# Patient Record
Sex: Female | Born: 1955 | Race: Black or African American | Hispanic: No | Marital: Married | Smoking: Former smoker
Health system: Southern US, Community
[De-identification: ages and names within clinical notes are randomized; demographics above are authoritative.]

## PROBLEM LIST (undated history)

## (undated) DIAGNOSIS — M1990 Unspecified osteoarthritis, unspecified site: Secondary | ICD-10-CM

## (undated) DIAGNOSIS — M47819 Spondylosis without myelopathy or radiculopathy, site unspecified: Secondary | ICD-10-CM

## (undated) DIAGNOSIS — M19011 Primary osteoarthritis, right shoulder: Secondary | ICD-10-CM

## (undated) DIAGNOSIS — K5900 Constipation, unspecified: Secondary | ICD-10-CM

## (undated) DIAGNOSIS — E785 Hyperlipidemia, unspecified: Secondary | ICD-10-CM

## (undated) DIAGNOSIS — H9319 Tinnitus, unspecified ear: Secondary | ICD-10-CM

## (undated) DIAGNOSIS — E559 Vitamin D deficiency, unspecified: Secondary | ICD-10-CM

## (undated) DIAGNOSIS — H269 Unspecified cataract: Secondary | ICD-10-CM

## (undated) DIAGNOSIS — L853 Xerosis cutis: Secondary | ICD-10-CM

## (undated) DIAGNOSIS — M2550 Pain in unspecified joint: Secondary | ICD-10-CM

## (undated) DIAGNOSIS — F32A Depression, unspecified: Secondary | ICD-10-CM

## (undated) DIAGNOSIS — D259 Leiomyoma of uterus, unspecified: Secondary | ICD-10-CM

## (undated) DIAGNOSIS — J309 Allergic rhinitis, unspecified: Secondary | ICD-10-CM

## (undated) DIAGNOSIS — T7840XA Allergy, unspecified, initial encounter: Secondary | ICD-10-CM

## (undated) DIAGNOSIS — R7303 Prediabetes: Secondary | ICD-10-CM

## (undated) DIAGNOSIS — M5430 Sciatica, unspecified side: Secondary | ICD-10-CM

## (undated) DIAGNOSIS — M549 Dorsalgia, unspecified: Secondary | ICD-10-CM

## (undated) HISTORY — DX: Constipation, unspecified: K59.00

## (undated) HISTORY — PX: CARPAL TUNNEL RELEASE: SHX101

## (undated) HISTORY — DX: Leiomyoma of uterus, unspecified: D25.9

## (undated) HISTORY — DX: Unspecified osteoarthritis, unspecified site: M19.90

## (undated) HISTORY — DX: Allergic rhinitis, unspecified: J30.9

## (undated) HISTORY — PX: FRACTURE SURGERY: SHX138

## (undated) HISTORY — DX: Prediabetes: R73.03

## (undated) HISTORY — DX: Primary osteoarthritis, right shoulder: M19.011

## (undated) HISTORY — DX: Spondylosis without myelopathy or radiculopathy, site unspecified: M47.819

## (undated) HISTORY — PX: OTHER SURGICAL HISTORY: SHX169

## (undated) HISTORY — DX: Hyperlipidemia, unspecified: E78.5

## (undated) HISTORY — DX: Unspecified cataract: H26.9

## (undated) HISTORY — DX: Vitamin D deficiency, unspecified: E55.9

## (undated) HISTORY — DX: Pain in unspecified joint: M25.50

## (undated) HISTORY — DX: Xerosis cutis: L85.3

## (undated) HISTORY — DX: Allergy, unspecified, initial encounter: T78.40XA

## (undated) HISTORY — DX: Depression, unspecified: F32.A

## (undated) HISTORY — DX: Tinnitus, unspecified ear: H93.19

## (undated) HISTORY — PX: ABDOMINAL HYSTERECTOMY: SHX81

## (undated) HISTORY — PX: SHOULDER SURGERY: SHX246

## (undated) HISTORY — DX: Dorsalgia, unspecified: M54.9

---

## 1998-05-16 ENCOUNTER — Ambulatory Visit (HOSPITAL_COMMUNITY): Admission: RE | Admit: 1998-05-16 | Discharge: 1998-05-16 | Payer: Self-pay | Admitting: Obstetrics and Gynecology

## 1998-05-18 ENCOUNTER — Ambulatory Visit (HOSPITAL_COMMUNITY): Admission: RE | Admit: 1998-05-18 | Discharge: 1998-05-18 | Payer: Self-pay | Admitting: Obstetrics and Gynecology

## 1999-06-08 ENCOUNTER — Other Ambulatory Visit: Admission: RE | Admit: 1999-06-08 | Discharge: 1999-06-08 | Payer: Self-pay | Admitting: Obstetrics and Gynecology

## 1999-12-28 ENCOUNTER — Inpatient Hospital Stay (HOSPITAL_COMMUNITY): Admission: EM | Admit: 1999-12-28 | Discharge: 1999-12-28 | Payer: Self-pay | Admitting: *Deleted

## 2000-07-17 ENCOUNTER — Other Ambulatory Visit: Admission: RE | Admit: 2000-07-17 | Discharge: 2000-07-17 | Payer: Self-pay | Admitting: Obstetrics and Gynecology

## 2000-07-29 ENCOUNTER — Encounter: Payer: Self-pay | Admitting: Gynecology

## 2000-07-29 ENCOUNTER — Ambulatory Visit (HOSPITAL_COMMUNITY): Admission: RE | Admit: 2000-07-29 | Discharge: 2000-07-29 | Payer: Self-pay | Admitting: Interventional Radiology

## 2000-08-09 ENCOUNTER — Other Ambulatory Visit: Admission: RE | Admit: 2000-08-09 | Discharge: 2000-08-09 | Payer: Self-pay | Admitting: Gynecology

## 2000-08-09 ENCOUNTER — Encounter (INDEPENDENT_AMBULATORY_CARE_PROVIDER_SITE_OTHER): Payer: Self-pay

## 2001-07-21 ENCOUNTER — Other Ambulatory Visit: Admission: RE | Admit: 2001-07-21 | Discharge: 2001-07-21 | Payer: Self-pay | Admitting: Gynecology

## 2002-09-24 ENCOUNTER — Other Ambulatory Visit: Admission: RE | Admit: 2002-09-24 | Discharge: 2002-09-24 | Payer: Self-pay | Admitting: Gynecology

## 2003-11-24 ENCOUNTER — Other Ambulatory Visit: Admission: RE | Admit: 2003-11-24 | Discharge: 2003-11-24 | Payer: Self-pay | Admitting: Internal Medicine

## 2004-12-14 ENCOUNTER — Ambulatory Visit: Payer: Self-pay | Admitting: Family Medicine

## 2004-12-14 ENCOUNTER — Other Ambulatory Visit: Admission: RE | Admit: 2004-12-14 | Discharge: 2004-12-14 | Payer: Self-pay | Admitting: Family Medicine

## 2005-12-18 ENCOUNTER — Encounter (INDEPENDENT_AMBULATORY_CARE_PROVIDER_SITE_OTHER): Payer: Self-pay | Admitting: Internal Medicine

## 2005-12-18 LAB — CONVERTED CEMR LAB: Pap Smear: NORMAL

## 2005-12-19 ENCOUNTER — Other Ambulatory Visit: Admission: RE | Admit: 2005-12-19 | Discharge: 2005-12-19 | Payer: Self-pay | Admitting: Family Medicine

## 2005-12-19 ENCOUNTER — Ambulatory Visit: Payer: Self-pay | Admitting: Family Medicine

## 2006-08-21 ENCOUNTER — Ambulatory Visit: Payer: Self-pay | Admitting: Family Medicine

## 2006-12-23 ENCOUNTER — Encounter (INDEPENDENT_AMBULATORY_CARE_PROVIDER_SITE_OTHER): Payer: Self-pay | Admitting: Internal Medicine

## 2006-12-23 DIAGNOSIS — D259 Leiomyoma of uterus, unspecified: Secondary | ICD-10-CM | POA: Insufficient documentation

## 2006-12-26 ENCOUNTER — Encounter (INDEPENDENT_AMBULATORY_CARE_PROVIDER_SITE_OTHER): Payer: Self-pay | Admitting: Internal Medicine

## 2006-12-31 ENCOUNTER — Telehealth (INDEPENDENT_AMBULATORY_CARE_PROVIDER_SITE_OTHER): Payer: Self-pay | Admitting: *Deleted

## 2007-01-24 ENCOUNTER — Encounter: Payer: Self-pay | Admitting: Internal Medicine

## 2007-01-24 DIAGNOSIS — S82899A Other fracture of unspecified lower leg, initial encounter for closed fracture: Secondary | ICD-10-CM | POA: Insufficient documentation

## 2007-01-27 ENCOUNTER — Ambulatory Visit: Payer: Self-pay | Admitting: Family Medicine

## 2007-01-31 ENCOUNTER — Encounter (INDEPENDENT_AMBULATORY_CARE_PROVIDER_SITE_OTHER): Payer: Self-pay | Admitting: *Deleted

## 2007-02-04 ENCOUNTER — Other Ambulatory Visit: Admission: RE | Admit: 2007-02-04 | Discharge: 2007-02-04 | Payer: Self-pay | Admitting: Family Medicine

## 2007-02-04 ENCOUNTER — Encounter (INDEPENDENT_AMBULATORY_CARE_PROVIDER_SITE_OTHER): Payer: Self-pay | Admitting: Internal Medicine

## 2007-02-04 ENCOUNTER — Ambulatory Visit: Payer: Self-pay | Admitting: Family Medicine

## 2007-02-04 LAB — CONVERTED CEMR LAB: Pap Smear: NORMAL

## 2007-02-06 LAB — CONVERTED CEMR LAB
BUN: 11 mg/dL (ref 6–23)
Basophils Absolute: 0 10*3/uL (ref 0.0–0.1)
Basophils Relative: 1.2 % — ABNORMAL HIGH (ref 0.0–1.0)
CO2: 25 meq/L (ref 19–32)
Calcium: 8.6 mg/dL (ref 8.4–10.5)
Chloride: 107 meq/L (ref 96–112)
Cholesterol: 196 mg/dL (ref 0–200)
Creatinine, Ser: 0.7 mg/dL (ref 0.4–1.2)
Eosinophils Absolute: 0.1 10*3/uL (ref 0.0–0.6)
Eosinophils Relative: 2.3 % (ref 0.0–5.0)
FSH: 5.4 milliintl units/mL
GFR calc Af Amer: 113 mL/min
GFR calc non Af Amer: 94 mL/min
Glucose, Bld: 79 mg/dL (ref 70–99)
HCT: 33.6 % — ABNORMAL LOW (ref 36.0–46.0)
HDL: 73.1 mg/dL (ref >39.0)
Hemoglobin: 11.4 g/dL — ABNORMAL LOW (ref 12.0–15.0)
LDL Cholesterol: 115 mg/dL — ABNORMAL HIGH (ref 0–99)
Lymphocytes Relative: 43 % (ref 12.0–46.0)
MCHC: 33.7 g/dL (ref 30.0–36.0)
MCV: 87.9 fL (ref 78.0–100.0)
Monocytes Absolute: 0.4 10*3/uL (ref 0.2–0.7)
Monocytes Relative: 11.7 % — ABNORMAL HIGH (ref 3.0–11.0)
Neutro Abs: 1.5 10*3/uL (ref 1.4–7.7)
Neutrophils Relative %: 41.8 % — ABNORMAL LOW (ref 43.0–77.0)
Platelets: 242 10*3/uL (ref 150–400)
Potassium: 3.8 meq/L (ref 3.5–5.1)
RBC: 3.83 M/uL — ABNORMAL LOW (ref 3.87–5.11)
RDW: 13.4 % (ref 11.5–14.6)
Sodium: 137 meq/L (ref 135–145)
TSH: 1.48 microintl units/mL (ref 0.35–5.50)
Total CHOL/HDL Ratio: 2.7
Triglycerides: 42 mg/dL (ref 0–149)
VLDL: 8 mg/dL (ref 0–40)
WBC: 3.7 10*3/uL — ABNORMAL LOW (ref 4.5–10.5)

## 2007-02-25 ENCOUNTER — Encounter (INDEPENDENT_AMBULATORY_CARE_PROVIDER_SITE_OTHER): Payer: Self-pay | Admitting: Internal Medicine

## 2007-02-26 ENCOUNTER — Ambulatory Visit: Payer: Self-pay | Admitting: Gastroenterology

## 2007-03-11 ENCOUNTER — Encounter: Payer: Self-pay | Admitting: Family Medicine

## 2007-03-11 ENCOUNTER — Ambulatory Visit: Payer: Self-pay | Admitting: Gastroenterology

## 2007-03-20 ENCOUNTER — Encounter (INDEPENDENT_AMBULATORY_CARE_PROVIDER_SITE_OTHER): Payer: Self-pay | Admitting: Internal Medicine

## 2007-03-21 ENCOUNTER — Ambulatory Visit: Payer: Self-pay | Admitting: Gastroenterology

## 2007-03-21 LAB — CONVERTED CEMR LAB
Fecal Occult Blood: NEGATIVE
OCCULT 1: NEGATIVE
OCCULT 2: NEGATIVE
OCCULT 3: NEGATIVE
OCCULT 4: NEGATIVE
OCCULT 5: NEGATIVE

## 2007-06-27 ENCOUNTER — Encounter (INDEPENDENT_AMBULATORY_CARE_PROVIDER_SITE_OTHER): Payer: Self-pay | Admitting: *Deleted

## 2007-06-27 ENCOUNTER — Inpatient Hospital Stay (HOSPITAL_COMMUNITY): Admission: RE | Admit: 2007-06-27 | Discharge: 2007-06-29 | Payer: Self-pay | Admitting: *Deleted

## 2007-12-16 ENCOUNTER — Encounter (INDEPENDENT_AMBULATORY_CARE_PROVIDER_SITE_OTHER): Payer: Self-pay | Admitting: Internal Medicine

## 2007-12-17 ENCOUNTER — Ambulatory Visit: Payer: Self-pay | Admitting: Family Medicine

## 2007-12-17 DIAGNOSIS — M279 Disease of jaws, unspecified: Secondary | ICD-10-CM | POA: Insufficient documentation

## 2007-12-22 ENCOUNTER — Encounter (INDEPENDENT_AMBULATORY_CARE_PROVIDER_SITE_OTHER): Payer: Self-pay | Admitting: *Deleted

## 2007-12-22 ENCOUNTER — Encounter (INDEPENDENT_AMBULATORY_CARE_PROVIDER_SITE_OTHER): Payer: Self-pay | Admitting: Internal Medicine

## 2008-01-07 ENCOUNTER — Encounter (INDEPENDENT_AMBULATORY_CARE_PROVIDER_SITE_OTHER): Payer: Self-pay | Admitting: Internal Medicine

## 2008-02-10 ENCOUNTER — Ambulatory Visit: Payer: Self-pay | Admitting: Family Medicine

## 2008-02-10 ENCOUNTER — Encounter: Admission: RE | Admit: 2008-02-10 | Discharge: 2008-02-10 | Payer: Self-pay | Admitting: Family Medicine

## 2008-02-11 LAB — CONVERTED CEMR LAB
ALT: 28 units/L (ref 0–35)
AST: 26 units/L (ref 0–37)
BUN: 13 mg/dL (ref 6–23)
Basophils Absolute: 0 10*3/uL (ref 0.0–0.1)
Basophils Relative: 0.6 % (ref 0.0–1.0)
CO2: 28 meq/L (ref 19–32)
Calcium: 8.8 mg/dL (ref 8.4–10.5)
Chloride: 107 meq/L (ref 96–112)
Cholesterol: 215 mg/dL (ref 0–200)
Creatinine, Ser: 0.6 mg/dL (ref 0.4–1.2)
Direct LDL: 114.2 mg/dL
Eosinophils Absolute: 0.1 10*3/uL (ref 0.0–0.7)
Eosinophils Relative: 2.8 % (ref 0.0–5.0)
GFR calc Af Amer: 135 mL/min
GFR calc non Af Amer: 112 mL/min
Glucose, Bld: 90 mg/dL (ref 70–99)
HCT: 38.9 % (ref 36.0–46.0)
HDL: 80.8 mg/dL (ref >39.0)
Hemoglobin: 13.4 g/dL (ref 12.0–15.0)
Lymphocytes Relative: 47.9 % — ABNORMAL HIGH (ref 12.0–46.0)
MCHC: 34.3 g/dL (ref 30.0–36.0)
MCV: 87.9 fL (ref 78.0–100.0)
Monocytes Absolute: 0.3 10*3/uL (ref 0.1–1.0)
Monocytes Relative: 9.9 % (ref 3.0–12.0)
Neutro Abs: 1.2 10*3/uL — ABNORMAL LOW (ref 1.4–7.7)
Neutrophils Relative %: 38.8 % — ABNORMAL LOW (ref 43.0–77.0)
Platelets: 193 10*3/uL (ref 150–400)
Potassium: 4.2 meq/L (ref 3.5–5.1)
RBC: 4.43 M/uL (ref 3.87–5.11)
RDW: 13 % (ref 11.5–14.6)
Sodium: 140 meq/L (ref 135–145)
TSH: 1.74 microintl units/mL (ref 0.35–5.50)
Total CHOL/HDL Ratio: 2.7
Triglycerides: 36 mg/dL (ref 0–149)
VLDL: 7 mg/dL (ref 0–40)
WBC: 3.1 10*3/uL — ABNORMAL LOW (ref 4.5–10.5)

## 2008-02-16 ENCOUNTER — Telehealth (INDEPENDENT_AMBULATORY_CARE_PROVIDER_SITE_OTHER): Payer: Self-pay | Admitting: Internal Medicine

## 2008-06-20 ENCOUNTER — Encounter (INDEPENDENT_AMBULATORY_CARE_PROVIDER_SITE_OTHER): Payer: Self-pay | Admitting: Internal Medicine

## 2008-06-20 LAB — CONVERTED CEMR LAB: Pap Smear: NORMAL

## 2008-06-27 ENCOUNTER — Encounter (INDEPENDENT_AMBULATORY_CARE_PROVIDER_SITE_OTHER): Payer: Self-pay | Admitting: Internal Medicine

## 2008-09-02 ENCOUNTER — Ambulatory Visit: Payer: Self-pay | Admitting: Family Medicine

## 2008-09-02 DIAGNOSIS — J069 Acute upper respiratory infection, unspecified: Secondary | ICD-10-CM | POA: Insufficient documentation

## 2008-09-07 ENCOUNTER — Telehealth (INDEPENDENT_AMBULATORY_CARE_PROVIDER_SITE_OTHER): Payer: Self-pay | Admitting: Internal Medicine

## 2008-09-21 ENCOUNTER — Telehealth (INDEPENDENT_AMBULATORY_CARE_PROVIDER_SITE_OTHER): Payer: Self-pay | Admitting: Internal Medicine

## 2008-09-21 ENCOUNTER — Encounter (INDEPENDENT_AMBULATORY_CARE_PROVIDER_SITE_OTHER): Payer: Self-pay | Admitting: Internal Medicine

## 2009-02-11 ENCOUNTER — Encounter (INDEPENDENT_AMBULATORY_CARE_PROVIDER_SITE_OTHER): Payer: Self-pay | Admitting: Internal Medicine

## 2009-02-15 ENCOUNTER — Encounter (INDEPENDENT_AMBULATORY_CARE_PROVIDER_SITE_OTHER): Payer: Self-pay | Admitting: Internal Medicine

## 2009-03-01 ENCOUNTER — Ambulatory Visit: Payer: Self-pay | Admitting: Family Medicine

## 2009-03-01 DIAGNOSIS — J309 Allergic rhinitis, unspecified: Secondary | ICD-10-CM

## 2009-06-30 ENCOUNTER — Encounter (INDEPENDENT_AMBULATORY_CARE_PROVIDER_SITE_OTHER): Payer: Self-pay | Admitting: Internal Medicine

## 2009-06-30 ENCOUNTER — Other Ambulatory Visit: Admission: RE | Admit: 2009-06-30 | Discharge: 2009-06-30 | Payer: Self-pay | Admitting: Family Medicine

## 2009-06-30 ENCOUNTER — Ambulatory Visit: Payer: Self-pay | Admitting: Family Medicine

## 2009-06-30 DIAGNOSIS — N952 Postmenopausal atrophic vaginitis: Secondary | ICD-10-CM

## 2009-07-01 LAB — CONVERTED CEMR LAB
BUN: 10 mg/dL (ref 6–23)
CO2: 28 meq/L (ref 19–32)
Calcium: 9.1 mg/dL (ref 8.4–10.5)
Creatinine, Ser: 0.7 mg/dL (ref 0.4–1.2)
Direct LDL: 133.8 mg/dL
TSH: 1.9 microintl units/mL (ref 0.35–5.50)
Triglycerides: 33 mg/dL (ref 0.0–149.0)
Vit D, 25-Hydroxy: 24 ng/mL — ABNORMAL LOW (ref 30–89)

## 2009-07-05 ENCOUNTER — Ambulatory Visit: Payer: Self-pay | Admitting: Internal Medicine

## 2009-07-05 ENCOUNTER — Encounter (INDEPENDENT_AMBULATORY_CARE_PROVIDER_SITE_OTHER): Payer: Self-pay | Admitting: *Deleted

## 2009-07-05 ENCOUNTER — Encounter (INDEPENDENT_AMBULATORY_CARE_PROVIDER_SITE_OTHER): Payer: Self-pay | Admitting: Internal Medicine

## 2009-07-22 ENCOUNTER — Ambulatory Visit: Payer: Self-pay | Admitting: Family Medicine

## 2009-07-26 ENCOUNTER — Encounter: Payer: Self-pay | Admitting: Family Medicine

## 2009-07-26 ENCOUNTER — Telehealth (INDEPENDENT_AMBULATORY_CARE_PROVIDER_SITE_OTHER): Payer: Self-pay | Admitting: Internal Medicine

## 2009-07-26 LAB — CONVERTED CEMR LAB
OCCULT 1: NEGATIVE
OCCULT 2: NEGATIVE

## 2009-08-02 ENCOUNTER — Encounter: Payer: Self-pay | Admitting: Family Medicine

## 2009-09-19 ENCOUNTER — Telehealth: Payer: Self-pay | Admitting: Family Medicine

## 2009-10-05 ENCOUNTER — Ambulatory Visit: Payer: Self-pay | Admitting: Family Medicine

## 2009-10-05 DIAGNOSIS — N63 Unspecified lump in unspecified breast: Secondary | ICD-10-CM

## 2009-10-13 ENCOUNTER — Encounter: Payer: Self-pay | Admitting: Family Medicine

## 2009-11-03 ENCOUNTER — Telehealth: Payer: Self-pay | Admitting: Family Medicine

## 2009-12-29 ENCOUNTER — Ambulatory Visit: Payer: Self-pay | Admitting: Family Medicine

## 2010-02-03 ENCOUNTER — Encounter: Payer: Self-pay | Admitting: Family Medicine

## 2010-02-27 ENCOUNTER — Encounter: Payer: Self-pay | Admitting: Family Medicine

## 2010-04-11 ENCOUNTER — Telehealth: Payer: Self-pay | Admitting: Family Medicine

## 2010-05-24 ENCOUNTER — Ambulatory Visit: Payer: Self-pay | Admitting: Family Medicine

## 2010-06-30 ENCOUNTER — Telehealth (INDEPENDENT_AMBULATORY_CARE_PROVIDER_SITE_OTHER): Payer: Self-pay | Admitting: *Deleted

## 2010-07-03 ENCOUNTER — Ambulatory Visit: Payer: Self-pay | Admitting: Family Medicine

## 2010-07-03 DIAGNOSIS — E559 Vitamin D deficiency, unspecified: Secondary | ICD-10-CM

## 2010-07-03 LAB — CONVERTED CEMR LAB: Vit D, 25-Hydroxy: 41 ng/mL (ref 30–89)

## 2010-07-04 LAB — CONVERTED CEMR LAB
BUN: 12 mg/dL (ref 6–23)
Calcium: 8.9 mg/dL (ref 8.4–10.5)
Cholesterol: 240 mg/dL — ABNORMAL HIGH (ref 0–200)
Creatinine, Ser: 0.7 mg/dL (ref 0.4–1.2)
GFR calc non Af Amer: 108.35 mL/min (ref >60)
Potassium: 4.4 meq/L (ref 3.5–5.1)

## 2010-07-10 ENCOUNTER — Telehealth: Payer: Self-pay | Admitting: Family Medicine

## 2010-07-10 ENCOUNTER — Other Ambulatory Visit: Admission: RE | Admit: 2010-07-10 | Discharge: 2010-07-10 | Payer: Self-pay | Admitting: Family Medicine

## 2010-07-10 ENCOUNTER — Ambulatory Visit: Payer: Self-pay | Admitting: Family Medicine

## 2010-07-19 ENCOUNTER — Encounter: Payer: Self-pay | Admitting: Family Medicine

## 2010-07-19 LAB — CONVERTED CEMR LAB: Pap Smear: NEGATIVE

## 2010-08-31 ENCOUNTER — Ambulatory Visit
Admission: RE | Admit: 2010-08-31 | Discharge: 2010-08-31 | Payer: Self-pay | Source: Home / Self Care | Attending: Family Medicine | Admitting: Family Medicine

## 2010-08-31 ENCOUNTER — Encounter: Payer: Self-pay | Admitting: Family Medicine

## 2010-08-31 LAB — CONVERTED CEMR LAB: Rapid Strep: NEGATIVE

## 2010-09-19 NOTE — Progress Notes (Signed)
Summary: Rx Clarinex, Flonase, Vivelle-Dot  Phone Note Refill Request Call back at [phone] Message from:  Patient on July 10, 2010 2:09 PM  Refills Requested: Medication #1:  VIVELLE-DOT 0.025 MG/24HR PTTW use one two times a week   Supply Requested: 3 months  Medication #2:  CLARINEX-D 12 HOUR 2.5-120 MG XR12H-TAB 1 two times a day as needed congestion   Supply Requested: 3 months  Medication #3:  FLONASE 50 MCG/ACT SUSP 2 sprays each nostril once daily for nasal congestion   Supply Requested: 3 months Patient is requesting a Rx for a 90 day supply be mailed to [address] [place], [place] [address].  [facility].    Method Requested: Mail to Pharmacy Initial call taken by: Linde Gillis CMA Duncan Dull),  July 10, 2010 2:10 PM  Follow-up for Phone Call        ok to refill all three. Ruthe Mannan MD  July 10, 2010 2:17 PM     Prescriptions: FLONASE 50 MCG/ACT SUSP (FLUTICASONE PROPIONATE) 2 sprays each nostril once daily for nasal congestion  #3 x 3   Entered by:   Linde Gillis CMA (AAMA)   Authorized by:   Ruthe Mannan MD   Signed by:   Linde Gillis CMA (AAMA) on 07/10/2010   Method used:   Print then Give to Patient   RxID:   [id number] CLARINEX-D 12 HOUR 2.5-120 MG XR12H-TAB (DESLORATADINE-PSEUDOEPHEDRINE) 1 two times a day as needed congestion  #360 x 3   Entered by:   Linde Gillis CMA (AAMA)   Authorized by:   Ruthe Mannan MD   Signed by:   Linde Gillis CMA (AAMA) on 07/10/2010   Method used:   Print then Give to Patient   RxID:   [id number] VIVELLE-DOT 0.025 MG/24HR PTTW (ESTRADIOL) use one two times a week  #24 x 3   Entered by:   Linde Gillis CMA (AAMA)   Authorized by:   Ruthe Mannan MD   Signed by:   Linde Gillis CMA (AAMA) on 07/10/2010   Method used:   Print then Give to Patient   RxID:   [id number]  Rx's mailed to the address given above.  Linde Gillis CMA Duncan Dull)  July 10, 2010 2:39 PM

## 2010-09-19 NOTE — Progress Notes (Signed)
----   Converted from flag ---- ---- 06/29/2010 1:47 PM, Ruthe Mannan MD wrote: BMET (v70.0), fasting lipid panel (272.4)  ---- 06/29/2010 1:34 PM, Liane Comber CMA (AAMA) wrote: Lab orders please! Good Morning! This pt is scheduled for cpx labs Monday, which labs to draw and dx codes to use? Thanks Tasha ------------------------------

## 2010-09-19 NOTE — Progress Notes (Signed)
Summary: post nasal drip  Phone Note Call from Patient Call back at Home Phone [phone]   Caller: Patient Call For: Dr. Milinda Antis Summary of Call: Patient is having alot of post nasal drip that is actually causing her to feel a littel nauseated and is asking what she could take over the counter. Please advise. Dr. Dayton Martes is gone for the afternoon.  Initial call taken by: Melody Comas,  April 11, 2010 3:01 PM  Follow-up for Phone Call        I see clarinex on her list - is she taking that? Follow-up by: Judith Part MD,  April 11, 2010 3:24 PM  Additional Follow-up for Phone Call Additional follow up Details #1::        Patient notified as instructed by telephone. Pt is taking the Clarinex D and would like rx called in today because she needs to go back to work tomorrow. Dr Milinda Antis has left for the day and Selena Batten will ck with Dr Patrice Paradise to see if he will refill. Pt uses [facility].Lewanda Rife LPN  April 11, 2010 4:39 PM     Additional Follow-up for Phone Call Additional follow up Details #2::    refilled clarinex D to [facility]. Follow-up by: Eustaquio Boyden  MD,  April 11, 2010 4:49 PM  Additional Follow-up for Phone Call Additional follow up Details #3:: Details for Additional Follow-up Action Taken: Patient notified as instructed by telephone. Lewanda Rife LPN  April 11, 2010 4:52 PM   Prescriptions: CLARINEX-D 12 HOUR 2.5-120 MG XR12H-TAB (DESLORATADINE-PSEUDOEPHEDRINE) 1 two times a day as needed congestion  #60 x 6   Entered and Authorized by:   Eustaquio Boyden  MD   Signed by:   Eustaquio Boyden  MD on 04/11/2010   Method used:   Electronically to        [facility] (retail)       [address]       [place], [place]  [address]       Ph: [phone]       Fax: [phone]   RxID:   [id number]

## 2010-09-19 NOTE — Letter (Signed)
Summary: Results Follow up Letter  [place] [facility]  [address] [place], [place] [address]   Phone: [phone]  Fax: [phone]    07/19/2010 MRN: [id number]  Jillian [name] [address] [place], [place]  [address]  Dear Ms. GRAVES,  The following are the results of your recent test(s):  Test         Result    Pap Smear:        Normal __X___  Not Normal _____ Comments: ______________________________________________________ Cholesterol: LDL(Bad cholesterol):         Your goal is less than:         HDL (Good cholesterol):       Your goal is more than: Comments:  ______________________________________________________ Mammogram:        Normal _____  Not Normal _____ Comments:  ___________________________________________________________________ Hemoccult:        Normal _____  Not normal _______ Comments:    _____________________________________________________________________ Other Tests:    We routinely do not discuss normal results over the telephone.  If you desire a copy of the results, or you have any questions about this information we can discuss them at your next office visit.   Sincerely,     Dr. Ruthe Mannan, MD

## 2010-09-19 NOTE — Progress Notes (Signed)
Summary: Rx Vivelle-Dot  Phone Note Refill Request Call back at Home Phone [phone] Message from:  Patient on September 19, 2009 12:22 PM  Refills Requested: Medication #1:  VIVELLE-DOT 0.025 MG/24HR PTTW use one two times a week   Supply Requested: 1 year Patient request written Rx to be mailed to mail order company.  Please call when Rx ready for pick up.     Method Requested: Pick up at Office Initial call taken by: Linde Gillis CMA Duncan Dull),  September 19, 2009 12:23 PM  Follow-up for Phone Call        All mail order scripts have to be for a 90 day supply and she wants to pick this up so it will need to be printed and signed.  Lugene Fuquay CMA (AAMA)  September 19, 2009 3:46 PM     Prescriptions: VIVELLE-DOT 0.025 MG/24HR PTTW (ESTRADIOL) use one two times a week  #24 x 3   Entered and Authorized by:   Ruthe Mannan MD   Signed by:   Ruthe Mannan MD on 09/19/2009   Method used:   Print then Give to Patient   RxID:   [id number] VIVELLE-DOT 0.025 MG/24HR PTTW (ESTRADIOL) use one two times a week  #8 x 3   Entered and Authorized by:   Ruthe Mannan MD   Signed by:   Ruthe Mannan MD on 09/19/2009   Method used:   Handwritten   RxID:   [id number]

## 2010-09-19 NOTE — Assessment & Plan Note (Signed)
Summary: COLD/CLE   Vital Signs:  Patient profile:   55 year old female Height:      65.25 inches Weight:      184.4 pounds BMI:     30.56 Temp:     97.7 degrees F oral Pulse rate:   76 / minute Pulse rhythm:   regular BP sitting:   100 / 60  (left arm) Cuff size:   regular  Vitals Entered By: Benny Lennert CMA Duncan Dull) (Dec 29, 2009 10:41 AM)  History of Present Illness: Chief complaint cold  55 year old female:  Started to - get symptoms on Sat.   Coughing all the time.   This 55 Years Old Black Female comes in today with complaints of cough, runny nose, and sore throat.  using mucinex  REVIEW OF SYSTEMS GEN: Acute illness details above. CV: No chest pain or SOB GI: No noted N or V Otherwise, pertinent positives and negatives are noted in the HPI.   GEN: WDWN, NAD; alert,appropriate and cooperative throughout exam HEENT: Normocephalic and atraumatic. Throat clear, w/o exudate, no LAD, R TM clear, L TM - good landmarks, No fluid present. rhinnorhea.  Left frontal and maxillary sinuses: NT Right frontal and maxillary sinuses: NT NECK: No ant or post LAD CV: RRR, No M/G/R PULM: no resp distress, no accessory muscles.  No retractions. no w/c/r ABD: S,NT,ND,+BS, No HSM EXTR: no c/c/e PSYCH: full affect, pleasant, conversant   Allergies (verified): No Known Drug Allergies  Past History:  Past medical, surgical, family and social histories (including risk factors) reviewed, and no changes noted (except as noted below).  Past Medical History: Reviewed history from 01/24/2007 and no changes required. Hyperlipidemia uterine fibroids--MRI--5/08 fx R ankle--ORIF --4/98  Past Surgical History: Reviewed history from 12/17/2007 and no changes required. 12/23/06  MRI   L-spine - bulging disc L3-4, L4-5 4/05     2D Echo (-) ORIF  R ankle--4/98---Krege total hysterectomy 11/08  Family History: Reviewed history from 06/30/2009 and no changes required. Father: Alive 45  HTN, DM--died 26 of CHF Mother: Alive 44, HTN, DM, stroke--died at 27 of renal failure, had been on dyaliasis for 76yrs, glaucoma, mini strokes Siblings: 5 brothers--1 br died of brain cancer at age 68 ,            5 sisters--1 died of heart disease at 30 HBP:  (+) mother, father DM: (+) father, mother Stroke (+) mother sis ter with glaucoma--47 sister died at 12 of heart disease  Social History: Reviewed history from 01/24/2007 and no changes required. 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 1 tab every 6 hours as needed nausea Her updated medication list for this problem includes:    Allegra 180 Mg Tabs (Fexofenadine hcl) .Marland Kitchen... 1 by mouth daily    Clarinex-d 12 Hour 2.5-120 Mg Xr12h-tab (Desloratadine-pseudoephedrine) .Marland Kitchen... 1 two times a day as needed congestion    Aspirin 81 Mg Tabs (Aspirin) ..... One daily    Mobic 15 Mg Tabs (Meloxicam) .Marland Kitchen... As needed bursitis    Hydrocodone-homatropine 5-1.5 Mg/61ml Syrp (Hydrocodone-homatropine) .Marland Kitchen... 1 - 2 tsp by mouth at bedtime as needed cough  Tessalon Perles 100 Mg Caps (Benzonatate) .Marland Kitchen... 1 by mouth three times a day  Instructed on symptomatic treatment. Call if symptoms persist or worsen.   Complete Medication List: 1)  Multivitamins Tabs (Multiple vitamin) .Marland Kitchen.. 1 by mouth daily 2)  Calcium Carbonate 600 Mg Tabs (Calcium carbonate) .Marland Kitchen.. 1 by mouth daily 3)  Vitamin C 500 Mg Tabs (Ascorbic acid) .Marland Kitchen.. 1 by mouth daily 4)  Allegra 180 Mg Tabs (Fexofenadine hcl) .Marland Kitchen.. 1 by mouth daily 5)  Vitamin B-12 1000 Mcg Tabs (Cyanocobalamin) .Marland Kitchen.. 1 by mouth  daily 6)  Vitamin E 400 Unit Caps (Vitamin e) .Marland Kitchen.. 1 by mouth daily 7)  Vivelle-dot 0.025 Mg/24hr Pttw (Estradiol) .... Use one two times a week 8)  Clarinex-d 12 Hour 2.5-120 Mg Xr12h-tab (Desloratadine-pseudoephedrine) .Marland Kitchen.. 1 two times a day as needed congestion 9)  Aspirin 81 Mg Tabs (Aspirin) .... One daily 10)  Fish Oil Oil (Fish oil) .... One daily 11)  Flonase 50 Mcg/act Susp (Fluticasone propionate) .... 2 sprays each nostril once daily for nasal congestion 12)  Mobic 15 Mg Tabs (Meloxicam) .... As needed bursitis 13)  Vitamin D 1000 Unit Tabs (Cholecalciferol) .... 2,000 mg. daily 14)  Hydrocodone-homatropine 5-1.5 Mg/63ml Syrp (Hydrocodone-homatropine) .Marland Kitchen.. 1 - 2 tsp by mouth at bedtime as needed cough 15)  Tessalon Perles 100 Mg Caps (Benzonatate) .Marland Kitchen.. 1 by mouth three times a day  Patient Instructions: 1)  Upper Respiratory Infection 2)  -Viral Infections 3)  TREATMENT 4)  1. Drink plenty of fluids, but limit caffeine 5)  2. Decongestant: for congested noses, sinuses, and ear tubes. Pressure release and help drainage: Sudafed (pseudephedrine or Phenylephrine) (NOT IF YOU HAVE HIGH BLOOD PRESSURE) 6)  3. Nasal Sprays: Relieve pressure, promote drainage, open nasal and ear passages. Afrin or Neosynephrine can be used for only 3 days in row. 7)  5. Cough Suppressants: Several types over the counter such as DM. Codeine and Hydrocodon are prescription narcotic medicines that are powerful cough suppressants. 8)  DM cough suppressant usually are well tolerated with minimal side effects 9)  6. Expectorants: Liquify secretions and improve drainage. (ex=Robitussin or Mucinex)  Prescriptions: TESSALON PERLES 100 MG  CAPS (BENZONATATE) 1 by mouth three times a day  #60 x 0   Entered and Authorized by:   Hannah Beat MD   Signed by:   Hannah Beat MD on 12/29/2009   Method used:   Telephoned to ...       [facility] (retail)       [address]       [place], [place]  [address]       Ph: [phone]       Fax: [phone]   RxID:   [id number] NUUVOZD SUSPENSION 1 - 2 tsp by mouth at bedtime as needed cough  #240 mL x 0   Entered and Authorized by:   Hannah Beat MD   Signed by:   Hannah Beat MD on 12/29/2009   Method used:   Print then Give to Patient   RxID:   [id number]   Current Allergies (reviewed today): No known allergies

## 2010-09-19 NOTE — Assessment & Plan Note (Signed)
Summary: CPX/CLE   Vital Signs:  Patient profile:   55 year old female Height:      65.25 inches Weight:      191 pounds BMI:     31.65 Temp:     98.7 degrees F oral Pulse rate:   68 / minute Pulse rhythm:   regular BP sitting:   130 / 82  (left arm) Cuff size:   regular  Vitals Entered By: Linde Gillis CMA Duncan Dull) (July 10, 2010 8:59 AM) CC: complete physical   History of Present Illness: 55 yo here for CPX with no complaints.  Lipid panel looks good, LDL only mildly elevated at 136, HDL 94,  TG 25. BMET normal, labs discussed. She is very physically active and tries to eat a very low fat, high fiber diet.  UTD on all prevention.  S/p complete hysterectomy for fibroid tumors. No discomfort.  Current Medications (verified): 1)  Multivitamins  Tabs (Multiple Vitamin) .Marland Kitchen.. 1 By Mouth Daily 2)  Calcium Carbonate 600 Mg Tabs (Calcium Carbonate) .Marland Kitchen.. 1 By Mouth Daily 3)  Vitamin C 500 Mg Tabs (Ascorbic Acid) .Marland Kitchen.. 1 By Mouth Daily 4)  Allegra 180 Mg Tabs (Fexofenadine Hcl) .Marland Kitchen.. 1 By Mouth Daily 5)  Vitamin B-12 1000 Mcg Tabs (Cyanocobalamin) .Marland Kitchen.. 1 By Mouth Daily 6)  Vitamin E 400 Unit Caps (Vitamin E) .Marland Kitchen.. 1 By Mouth Daily 7)  Vivelle-Dot 0.025 Mg/24hr Pttw (Estradiol) .... Use One Two Times A Week 8)  Clarinex-D 12 Hour 2.5-120 Mg Xr12h-Tab (Desloratadine-Pseudoephedrine) .Marland Kitchen.. 1 Two Times A Day As Needed Congestion 9)  Aspirin 81 Mg  Tabs (Aspirin) .... One Daily 10)  Fish Oil   Oil (Fish Oil) .... One Daily 11)  Flonase 50 Mcg/act Susp (Fluticasone Propionate) .... 2 Sprays Each Nostril Once Daily For Nasal Congestion 12)  Mobic 15 Mg Tabs (Meloxicam) .... As Needed Bursitis 13)  Vitamin D 1000 Unit  Tabs (Cholecalciferol) .... 2,000 Mg. Daily 14)  Naproxen 500 Mg Tabs (Naproxen) .... Take One Tablet As Needed Muscle Spasms  Allergies (verified): No Known Drug Allergies  Past History:  Past Medical History: Last updated: 01/24/2007 Hyperlipidemia uterine  fibroids--MRI--5/08 fx R ankle--ORIF --4/98  Past Surgical History: Last updated: 12/17/2007 12/23/06  MRI   L-spine - bulging disc L3-4, L4-5 4/05     2D Echo (-) ORIF  R ankle--4/98---Krege total hysterectomy 11/08  Family History: Last updated: 06/30/2009 Father: Alive 60 HTN, DM--died 67 of CHF Mother: Alive 39, HTN, DM, stroke--died at 30 of renal failure, had been on dyaliasis for 57yrs, glaucoma, mini strokes Siblings: 5 brothers--1 br died of brain cancer at age 65 ,            5 sisters--1 died of heart disease at 30 HBP:  (+) mother, father DM: (+) father, mother Stroke (+) mother sis ter with glaucoma--47 sister died at 65 of heart disease  Social History: Last updated: 01/24/2007 Marital Status: Married, lives with husband Children: None Occupation: Research officer, 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 Eyes:  Denies blurring. ENT:  Denies difficulty swallowing. CV:  Denies chest pain or discomfort. Resp:  Denies shortness of breath. GI:  Denies abdominal pain, bloody stools, and change in bowel habits. GU:  Denies discharge and dysuria. MS:  Denies joint pain, joint redness,  and joint swelling. Derm:  Denies rash. Neuro:  Denies headaches. Psych:  Denies anxiety and depression. Endo:  Denies cold intolerance and heat intolerance. Heme:  Denies abnormal bruising and bleeding.  Physical Exam  General:  alert, well-developed, well-nourished, and well-hydrated.   Head:  normocephalic and atraumatic.   Eyes:  vision grossly intact, pupils equal, pupils round, and pupils reactive to light.   Ears:  R ear normal and L ear normal.   Nose:  no external deformity.   Mouth:  good dentition.   Neck:  No deformities, masses, or tenderness  noted. Breasts:  left breast- skin/areolae normal and no abnormal thickening, no masses Lungs:  normal respiratory effort, no intercostal retractions, no accessory muscle use, and normal breath sounds.   Heart:  normal rate, regular rhythm, and no murmur.   Abdomen:  Bowel sounds positive,abdomen soft and non-tender without masses, organomegaly or hernias noted. Rectal:  no external abnormalities.   Genitalia:  normal introitus, no external lesions, and no vaginal discharge.   Msk:  No deformity or scoliosis noted of thoracic or lumbar spine.   Extremities:  no edema Neurologic:  alert & oriented X3 and gait normal.   Skin:  Intact without suspicious lesions or rashes Psych:  normally interactive and good eye contact.     Impression & Recommendations:  Problem # 1:  HEALTH MAINTENANCE EXAM (ICD-V70.0) Reviewed preventive care protocols, scheduled due services, and updated immunizations Discussed nutrition, exercise, diet, and healthy lifestyle.  UTD on prevention. Flu shot today.  Complete Medication List: 1)  Multivitamins Tabs (Multiple vitamin) .Marland Kitchen.. 1 by mouth daily 2)  Calcium Carbonate 600 Mg Tabs (Calcium carbonate) .Marland Kitchen.. 1 by mouth daily 3)  Vitamin C 500 Mg Tabs (Ascorbic acid) .Marland Kitchen.. 1 by mouth daily 4)  Allegra 180 Mg Tabs (Fexofenadine hcl) .Marland Kitchen.. 1 by mouth daily 5)  Vitamin B-12 1000 Mcg Tabs (Cyanocobalamin) .Marland Kitchen.. 1 by mouth daily 6)  Vitamin E 400 Unit Caps (Vitamin e) .Marland Kitchen.. 1 by mouth daily 7)  Vivelle-dot 0.025 Mg/24hr Pttw (Estradiol) .... Use one two times a week 8)  Clarinex-d 12 Hour 2.5-120 Mg Xr12h-tab (Desloratadine-pseudoephedrine) .Marland Kitchen.. 1 two times a day as needed congestion 9)  Aspirin 81 Mg Tabs (Aspirin) .... One daily 10)  Fish Oil Oil (Fish oil) .... One daily 11)  Flonase 50 Mcg/act Susp (Fluticasone propionate) .... 2 sprays each nostril once daily for nasal congestion 12)  Mobic 15 Mg Tabs (Meloxicam) .... As needed bursitis 13)  Vitamin D 1000 Unit Tabs  (Cholecalciferol) .... 2,000 mg. daily 14)  Naproxen 500 Mg Tabs (Naproxen) .... Take one tablet as needed muscle spasms   Orders Added: 1)  Est. Patient 40-64 years [99396]    Current Allergies (reviewed today): No known allergies

## 2010-09-19 NOTE — Progress Notes (Signed)
Summary: Fever, nausea  Phone Note Call from Patient Call back at Home Phone [phone]   Caller: Patient Call For: Dr. Dayton Martes Summary of Call: Patient is complaining of being nauseous.  No vomiting or diarrhea.  Fever on and off for two days, no other symptoms.  She wanted to know if she should come in to be seen.  Advised that it sounds like she may be coming down with a virus which will just have to run its course.  Advised to take Tylenol for fever, keep a check on her fever and if it gets too high, 102 or higher and stays there to go to the urgent care of ER after hours.  Advised to push clear liquids, gatoraid, ginger ale, pedialyte.  Take small sips, if she feels like eating advise of braty diet, bananas, rice, apple sause, toast, yogurt.  No fatty or fried foods, no greasy foods.  If no better call us back and schedule appt.  Would like something called in for nausea.  [facility].   Initial call taken by: Linde Gillis CMA Duncan Dull),  November 03, 2009 11:55 AM  Follow-up for Phone Call        Agreed.  Phenergan called in. Follow-up by: Ruthe Mannan MD,  November 03, 2009 12:02 PM    New/Updated Medications: PROMETHAZINE HCL 12.5 MG TABS (PROMETHAZINE HCL) 1 tab every 6 hours as needed nausea Prescriptions: PROMETHAZINE HCL 12.5 MG TABS (PROMETHAZINE HCL) 1 tab every 6 hours as needed nausea  #20 x 0   Entered and Authorized by:   Ruthe Mannan MD   Signed by:   Ruthe Mannan MD on 11/03/2009   Method used:   Electronically to        [facility] (retail)       [address]       [place], [place]  [address]       Ph: [phone]       Fax: [phone]   RxID:   [id number]

## 2010-09-19 NOTE — Letter (Signed)
Summary: Out of Work  [facility]  [address] [place], [place] [address]   Phone: [phone]  Fax: [phone]    Dec 29, 2009   Employee:  JOANY KHATIB GRAVES    To Whom It May Concern:   For Medical reasons, please excuse the above named employee from work for the following dates:  Start:   12/29/2009  End:   OK to return to work 12/31/2009  If you need additional information, please feel free to contact our office.         Sincerely,    Hannah Beat MD

## 2010-09-19 NOTE — Assessment & Plan Note (Signed)
Summary: F/U,Jillian [name]'S PT/CLE   Vital Signs:  Patient profile:   55 year old female Height:      65.25 inches Weight:      190 pounds BMI:     31.49 Temp:     98.2 degrees F oral Pulse rate:   84 / minute Pulse rhythm:   regular BP sitting:   142 / 94  (left arm) Cuff size:   large  Vitals Entered By: Jillian [name]) (October 05, 2009 8:28 AM) CC: follow-up visit (BDB)   History of Present Illness: 55 yo pt new to me here for follow up left breast lump.   Left breast lump- noticed it in November.  Jillian [name] was following it.  Has not changed in size, not painful, no changes in nipple or nipple discharge.  No family or personal history of Breast Ca.  She is s/p total hysterectomy 2008--no cancer.  Taking HRT--tolerating curent dose of estrogen--On x 2 yrs, some mood swings and occ hot flash, does not feel dose needs to be changed.  Last mammogram was in 01/2009- normal.  UTD on all prevention.     Current Medications (verified): 1)  Multivitamins  Tabs (Multiple Vitamin) .Marland Kitchen.. 1 By Mouth Daily 2)  Calcium Carbonate 600 Mg Tabs (Calcium Carbonate) .Marland Kitchen.. 1 By Mouth Daily 3)  Vitamin C 500 Mg Tabs (Ascorbic Acid) .Marland Kitchen.. 1 By Mouth Daily 4)  Allegra 180 Mg Tabs (Fexofenadine Hcl) .Marland Kitchen.. 1 By Mouth Daily 5)  Vitamin B-12 1000 Mcg Tabs (Cyanocobalamin) .Marland Kitchen.. 1 By Mouth Daily 6)  Vitamin E 400 Unit Caps (Vitamin E) .Marland Kitchen.. 1 By Mouth Daily 7)  Vivelle-Dot 0.025 Mg/24hr Pttw (Estradiol) .... Use One Two Times A Week 8)  Clarinex-D 12 Hour 2.5-120 Mg Xr12h-Tab (Desloratadine-Pseudoephedrine) .Marland Kitchen.. 1 Two Times A Day As Needed Congestion 9)  Aspirin 81 Mg  Tabs (Aspirin) .... One Daily 10)  Fish Oil   Oil (Fish Oil) .... One Daily 11)  Flonase 50 Mcg/act Susp (Fluticasone Propionate) .... 2 Sprays Each Nostril Once Daily For Nasal Congestion 12)  Mobic 15 Mg Tabs (Meloxicam) .... As Needed Bursitis 13)  Vitamin D 1000 Unit  Tabs (Cholecalciferol) .... 2,000 Mg. Daily  Allergies  (verified): No Known Drug Allergies  Review of Systems      See HPI General:  Denies chills, fever, and malaise. CV:  Denies chest pain or discomfort. Resp:  Denies shortness of breath. Derm:  Denies rash.  Physical Exam  General:  alert, well-developed, well-nourished, and well-hydrated.   Breasts:  left breast- skin/areolae normal and no abnormal thickening, non tender mass at 6 oclock position.   Lungs:  normal respiratory effort, no intercostal retractions, no accessory muscle use, and normal breath sounds.   Heart:  normal rate, regular rhythm, and no murmur.   Psych:  normally interactive and good eye contact.     Impression & Recommendations:  Problem # 1:  BREAST MASS, LEFT (ICD-611.72) Assessment New Likely benign but will order mammogram/ultrasound if needed to r/o malignancy given h/o HRT.     Orders: Radiology Referral (Radiology)  Complete Medication List: 1)  Multivitamins Tabs (Multiple vitamin) .Marland Kitchen.. 1 by mouth daily 2)  Calcium Carbonate 600 Mg Tabs (Calcium carbonate) .Marland Kitchen.. 1 by mouth daily 3)  Vitamin C 500 Mg Tabs (Ascorbic acid) .Marland Kitchen.. 1 by mouth daily 4)  Allegra 180 Mg Tabs (Fexofenadine hcl) .Marland Kitchen.. 1 by mouth daily 5)  Vitamin B-12 1000 Mcg Tabs (Cyanocobalamin) .Marland Kitchen.. 1 by mouth daily 6)  Vitamin E 400 Unit Caps (Vitamin e) .Marland Kitchen.. 1 by mouth daily 7)  Vivelle-dot 0.025 Mg/24hr Pttw (Estradiol) .... Use one two times a week 8)  Clarinex-d 12 Hour 2.5-120 Mg Xr12h-tab (Desloratadine-pseudoephedrine) .Marland Kitchen.. 1 two times a day as needed congestion 9)  Aspirin 81 Mg Tabs (Aspirin) .... One daily 10)  Fish Oil Oil (Fish oil) .... One daily 11)  Flonase 50 Mcg/act Susp (Fluticasone propionate) .... 2 sprays each nostril once daily for nasal congestion 12)  Mobic 15 Mg Tabs (Meloxicam) .... As needed bursitis 13)  Vitamin D 1000 Unit Tabs (Cholecalciferol) .... 2,000 mg. daily  Patient Instructions: 1)  It was so nice to meet you, Jillian [name]. 2)  Please stop by to see  Jillian [name] on your way out to set up your mammogram/ultrasound.  Current Allergies (reviewed today): No known allergies   Flex Sig Next Due:  Not Indicated Last Hemoccult Result: Negative (05/21/1997 3:37:33 PM) Hemoccult Next Due:  Not Indicated

## 2010-09-21 NOTE — Assessment & Plan Note (Signed)
Summary: sore throat/alc   Vital Signs:  Patient profile:   55 year old female Height:      65.25 inches Weight:      197.50 pounds BMI:     32.73 Temp:     98.0 degrees F oral Pulse rate:   72 / minute Pulse rhythm:   regular BP sitting:   110 / 80  (right arm) Cuff size:   regular  Vitals Entered By: Linde Gillis CMA Duncan Dull) (August 31, 2010 3:35 PM) CC: sore throat   History of Present Illness: 55 yo here for sore throat and laryngitis.  Started 3 days ago. Ears popping, throat sore. Taking Clarinex D - 1 tab by mouth two times a day which is helping with her nasal congestion. Cough is dry. No fevers or chills.  Current Medications (verified): 1)  Multivitamins  Tabs (Multiple Vitamin) .Marland Kitchen.. 1 By Mouth Daily 2)  Calcium Carbonate 600 Mg Tabs (Calcium Carbonate) .Marland Kitchen.. 1 By Mouth Daily 3)  Vitamin C 500 Mg Tabs (Ascorbic Acid) .Marland Kitchen.. 1 By Mouth Daily 4)  Allegra 180 Mg Tabs (Fexofenadine Hcl) .Marland Kitchen.. 1 By Mouth Daily 5)  Vitamin B-12 1000 Mcg Tabs (Cyanocobalamin) .Marland Kitchen.. 1 By Mouth Daily 6)  Vitamin E 400 Unit Caps (Vitamin E) .Marland Kitchen.. 1 By Mouth Daily 7)  Vivelle-Dot 0.025 Mg/24hr Pttw (Estradiol) .... Use One Two Times A Week 8)  Clarinex-D 12 Hour 2.5-120 Mg Xr12h-Tab (Desloratadine-Pseudoephedrine) .Marland Kitchen.. 1 Two Times A Day As Needed Congestion 9)  Aspirin 81 Mg  Tabs (Aspirin) .... One Daily 10)  Fish Oil   Oil (Fish Oil) .... One Daily 11)  Flonase 50 Mcg/act Susp (Fluticasone Propionate) .... 2 Sprays Each Nostril Once Daily For Nasal Congestion 12)  Mobic 15 Mg Tabs (Meloxicam) .... As Needed Bursitis 13)  Vitamin D 1000 Unit  Tabs (Cholecalciferol) .... 2,000 Mg. Daily 14)  Naproxen 500 Mg Tabs (Naproxen) .... Take One Tablet As Needed Muscle Spasms 15)  Augmentin 875-125 Mg Tabs (Amoxicillin-Pot Clavulanate) .Marland Kitchen.. 1 By Mouth 2 Times Daily X 10 Days  Allergies (verified): No Known Drug Allergies  Past History:  Past Medical History: Last updated:  01/24/2007 Hyperlipidemia uterine fibroids--MRI--5/08 fx R ankle--ORIF --4/98  Past Surgical History: Last updated: 12/17/2007 12/23/06  MRI   L-spine - bulging disc L3-4, L4-5 4/05     2D Echo (-) ORIF  R ankle--4/98---[facility] total hysterectomy 11/08  Family History: Last updated: 06/30/2009 Father: Alive 66 HTN, DM--died 90 of CHF Mother: Alive 37, HTN, DM, stroke--died at 60 of renal failure, had been on dyaliasis for 63yrs, glaucoma, mini strokes Siblings: 5 brothers--1 br died of brain cancer at age 105 ,            5 sisters--1 died of heart disease at 30 HBP:  (+) mother, father DM: (+) father, mother Stroke (+) mother sis ter with glaucoma--47 sister died at 69 of heart disease  Social History: Last updated: 01/24/2007 Marital Status: Married, lives with husband Children: None Occupation: Research officer, 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 ENT:  Complains of earache and sore throat; denies ear discharge and sinus pressure. Resp:  Complains of cough; denies shortness of breath, sputum productive, and wheezing. Derm:  Denies rash.  Physical Exam  General:  alert, well-developed, well-nourished, and  well-hydrated.   VSS voice is hoarse Ears:  TMs retracted bilaterally. Nose:  mucosal erythema.   Mouth:  good dentition.   pharyngeal erythema.   Lungs:  normal respiratory effort, no intercostal retractions, no accessory muscle use, and normal breath sounds.   Heart:  normal rate, regular rhythm, and no murmur.   Skin:  Intact without suspicious lesions or rashes Psych:  normally interactive and good eye contact.     Impression & Recommendations:  Problem # 1:  URI (ICD-465.9) Assessment New Likely viral.   Given rx for Augmentin to  fill only if symptoms progress over the weekend. See pt instructions for details. Her updated medication list for this problem includes:    Allegra 180 Mg Tabs (Fexofenadine hcl) .Marland Kitchen... 1 by mouth daily    Clarinex-d 12 Hour 2.5-120 Mg Xr12h-tab (Desloratadine-pseudoephedrine) .Marland Kitchen... 1 two times a day as needed congestion    Aspirin 81 Mg Tabs (Aspirin) ..... One daily    Mobic 15 Mg Tabs (Meloxicam) .Marland Kitchen... As needed bursitis    Naproxen 500 Mg Tabs (Naproxen) .Marland Kitchen... Take one tablet as needed muscle spasms  Complete Medication List: 1)  Multivitamins Tabs (Multiple vitamin) .Marland Kitchen.. 1 by mouth daily 2)  Calcium Carbonate 600 Mg Tabs (Calcium carbonate) .Marland Kitchen.. 1 by mouth daily 3)  Vitamin C 500 Mg Tabs (Ascorbic acid) .Marland Kitchen.. 1 by mouth daily 4)  Allegra 180 Mg Tabs (Fexofenadine hcl) .Marland Kitchen.. 1 by mouth daily 5)  Vitamin B-12 1000 Mcg Tabs (Cyanocobalamin) .Marland Kitchen.. 1 by mouth daily 6)  Vitamin E 400 Unit Caps (Vitamin e) .Marland Kitchen.. 1 by mouth daily 7)  Vivelle-dot 0.025 Mg/24hr Pttw (Estradiol) .... Use one two times a week 8)  Clarinex-d 12 Hour 2.5-120 Mg Xr12h-tab (Desloratadine-pseudoephedrine) .Marland Kitchen.. 1 two times a day as needed congestion 9)  Aspirin 81 Mg Tabs (Aspirin) .... One daily 10)  Fish Oil Oil (Fish oil) .... One daily 11)  Flonase 50 Mcg/act Susp (Fluticasone propionate) .... 2 sprays each nostril once daily for nasal congestion 12)  Mobic 15 Mg Tabs (Meloxicam) .... As needed bursitis 13)  Vitamin D 1000 Unit Tabs (Cholecalciferol) .... 2,000 mg. daily 14)  Naproxen 500 Mg Tabs (Naproxen) .... Take one tablet as needed muscle spasms 15)  Augmentin 875-125 Mg Tabs (Amoxicillin-pot clavulanate) .Marland Kitchen.. 1 by mouth 2 times daily x 10 days  Other Orders: Rapid Strep (78295)  Patient Instructions: 1)  Fill antibiotic prescription if your symptoms progress over the weekend.  Drink lots of fluids.  Treat sympotmatically with Mucinex, nasal saline irrigation, and Tylenol/Ibuprofen.  You can use warm  compresses.  Cough suppressant at night. Call if not improving as expected in 5-7 days.  Prescriptions: AUGMENTIN 875-125 MG TABS (AMOXICILLIN-POT CLAVULANATE) 1 by mouth 2 times daily x 10 days  #20 x 0   Entered and Authorized by:   Ruthe Mannan MD   Signed by:   Ruthe Mannan MD on 08/31/2010   Method used:   Print then Give to Patient   RxID:   [id number]    Orders Added: 1)  Rapid Strep [52841] 2)  Est. Patient Level III [32440]    Current Allergies (reviewed today): No known allergies   Laboratory Results    Other Tests  Rapid Strep: negative  Kit Test Internal QC: Positive   (Normal Range: Negative)

## 2010-09-21 NOTE — Letter (Signed)
Summary: Out of Work  [facility] [employer] [facility]  [address] [place], [place] [address]   Phone: [phone]  Fax: [phone]    August 31, 2010   Employee:  Jillian [name]    To Whom It May Concern:   For Medical reasons, please excuse the above named employee from work for the following dates:  Start:   08/31/2010  End:   09/02/2010  If you need additional information, please feel free to contact our office.         Sincerely,    Ruthe Mannan MD

## 2011-01-02 NOTE — Op Note (Signed)
Jillian [name], [name]              ACCOUNT NO.:  [id number]   MEDICAL RECORD NO.:  [id number]          PATIENT TYPE:  AMB   LOCATION:  SDC                           FACILITY:  [facility]   PHYSICIAN:  [place] B. Earlene Plater, M.D.  DATE OF BIRTH:  October 08, 1955   DATE OF PROCEDURE:  06/27/2007  DATE OF DISCHARGE:                               OPERATIVE REPORT   PREOPERATIVE DIAGNOSES:  1. Abnormal bleeding.  2. Fibroids.   POSTOPERATIVE DIAGNOSES:  1. Abnormal bleeding.  2. Fibroids.   PROCEDURE:  Total abdominal hysterectomy with bilateral salpingo-  oophorectomy.   SURGEON:  Chester Holstein. Earlene Plater, M.D.   ASSISTANT:  Genia Del, M.D.   ANESTHESIA:  General.   FINDINGS:  Approximately 18-week size fibroid uterus.  Normal-appearing  tubes and ovaries.   SPECIMENS:  Uterus, cervix, bilateral tubes and ovaries to pathology.   BLOOD LOSS:  500.   COMPLICATIONS:  None.   INDICATIONS:  Patient with a history of heavy menstrual periods and a  large fibroid uterus.  It was determined in the office on pelvic exam  the uterus was at least 16-17 weeks size and immobile and quite broad  laterally.  Therefore, I did not think she was a good candidate for a  laparoscopic hysterectomy.  The patient advised of the risks of surgery  to include infection, bleeding, damage to surrounding organs.   PROCEDURE:  The patient was taken to the operating room and general  anesthesia obtained.  She was prepped and draped in standard fashion, a  Foley catheter inserted into the bladder.  Exam under anesthesia  confirmed an approximately 18-week size fibroid uterus which was not  mobile.  It was also quite broad, taking up the entire width of the  pelvis.   After the patient was prepped and draped, a vertical midline incision  made from the pubis to the umbilicus, carried sharply to fascia.  The  fascia was divided sharply and elevated.  Posterior sheath and  peritoneum elevated and entered sharply.   Self-retaining retractor  inserted.  Care taken to protect the underlying psoas muscles.  Trendelenburg position obtained.  Bowel packed superiorly.  Each cornu  was grasped with a long Kelly clamp.  The right round ligament was  placed on traction, suture ligated and divided.  Retroperitoneal space  developed, ureter identified.  The right ovary was adherent to the  pelvic sidewall and limited mobility; therefore, it was left on the  sidewall initially to be retrieved after the hysterectomy.  The right  utero-ovarian pedicle was isolated, clamped, divided and suture ligated  with 0 Vicryl and hemostasis obtained.  Left round ligament placed on  traction, suture ligated and divided, retroperitoneal space developed,  the course of the ureter identified.  The left infundibulopelvic  ligament was isolated, doubly clamped and divided, suture ligated with 0  Vicryl x2 and hemostasis obtained.  Dissection was continued anteriorly  to develop the bladder flap sharply.  There were multiple 3-4-cm  subserosal fibroids in the anterior lower uterine segment.  The areolar  tissue around these fibroids was dissected, which provided better  mobility of the lower portion of the uterus.   The right uterine artery was skeletonized, clamped x2 with curved Heaney  clamps, and divided and suture ligated with 0 Vicryl with hemostasis  obtained. Repeated on the left side in a similar manner.  Deeper pelvic  structures could not be visualized due to bulkiness of the uterus,  particularly anteriorly.  Therefore, the uterus was truncated at the  level of the cervix with a scalpel.  The cervical stump was then grasped  with Kocher clamps and elevated.  The course the ureter on each site was  again identified and found to be well lateral and inferior.  Straight  Heaney clamps were then used to clamp the remainder of the broad  ligament along the cervix.  Each was then divided and suture ligated  with 0 Vicryl.   The uterosacral ligament and vaginal angle were then  clamped with a curved Heaney clamp, divided and suture ligated in a  similar manner.  The remainder of the vaginal cuff was clamped with  curved Heaney clamps, divided and suture ligated with 0 Vicryl and  hemostasis obtained.   The pelvis was irrigated and the vaginal cuff was hemostatic.  The  course of the right ureter was reidentified and the ovary dissected  sharply off the pelvic sidewall.  The IP ligament isolated, doubly  clamped and divided, suture ligated x2 with 0 Vicryl with hemostasis  obtained.   The pelvis was irrigated again and the bladder inspected.  It was  bleeding slightly at the dome.  This was made hemostatic with the Bovie.   All lines of dissection were reinspected and were hemostatic.  The packs  were removed and the bowel returned to anatomic position.  The fascia  was closed with a running stitch of double-stranded PDS.   The subcutaneous tissue was irrigated and made hemostatic with the  Bovie.  Skin was closed with staples.   The patient tolerated the procedure well with no complications.  She was  taken to the recovery room awake and alert, in stable condition.  All  counts correct per the operating room staff.      Gerri Spore B. Earlene Plater, M.D.  Electronically Signed     WBD/MEDQ  D:  06/27/2007  T:  06/27/2007  Job:  [id number]

## 2011-01-02 NOTE — Discharge Summary (Signed)
NAMETEMESHA, Jillian [name]              ACCOUNT NO.:  [id number]   MEDICAL RECORD NO.:  [id number]          PATIENT TYPE:  INP   LOCATION:  9311                          [facility]ACILITY:  [facility]   PHYSICIAN:  [place] B. Earlene Plater, M.D.  DATE OF BIRTH:  May 22, 1956   DATE OF ADMISSION:  06/27/2007  DATE OF DISCHARGE:  06/29/2007                               DISCHARGE SUMMARY   ADMISSION DIAGNOSES:  1. Abnormal uterine bleeding.  2. Symptomatic uterine fibroids.   DISCHARGE DIAGNOSES:  1. Abnormal uterine bleeding.  2. Symptomatic uterine fibroids.   PROCEDURE:  Total abdominal hysterectomy, bilateral salpingo-  oophorectomy.   HISTORY OF PRESENT ILLNESS:  For complete details, please see the H&P on  the chart.  Briefly, the patient presented for definitive surgical  management of abnormal bleeding and symptomatic fibroids.   HOSPITAL COURSE:  On the day of admission the patient underwent total  abdominal hysterectomy and bilateral salpingo-oophorectomy without  incident.  Findings at the time of surgery include a 670 gram uterus,  atrophic-appearing ovaries.  Postoperatively, the patient rapidly  regained the ability to ambulate, void, and tolerate a regular diet.  She was discharged to home on the 2nd postoperative day in satisfactory  condition.   DISCHARGE INSTRUCTIONS:  No heavy lifting for 6 weeks.  No driving for 2  weeks.  Pelvic rest for 6 weeks.   DISCHARGE MEDICATIONS:  1. Tylox 1-2 p.o. q.4 h. p.r.n. pain.  2. Vivelle patch 0.05 mg daily, change twice weekly.   FOLLOWUP:  One week for stable removal and 4 weeks for post-op check.   DISPOSITION AT DISCHARGE:  Satisfactory.      Gerri Spore B. Earlene Plater, M.D.  Electronically Signed     WBD/MEDQ  D:  06/29/2007  T:  06/29/2007  Job:  [id number]

## 2011-01-05 NOTE — Assessment & Plan Note (Signed)
[facility]                                 ON-CALL NOTE   NAME:Jillian, [name]                       MRN:          [id number]  DATE:08/20/2006                            DOB:          June 03, 1956    [facility] patient.   Phone number is [phone].   The patient has less than 24-hours of acute onset of diarrhea, nausea,  fever or 101+ and body aches. There is no associated blood. There is  some abdominal cramping, but no pain before this onset. She has no  chronic underlying illnesses of concern and wonders if there is  something we can call in.  Discussed with her frequent clear liquids,  Tylenol and Imodium if she has no obstructive symptoms or blood in her  stool. That most of these are viral. However, she can be seen in the  office tomorrow if fever is persisting or signs of dehydration, she can  call back.   Time of phone evaluation: 3-1/2 minutes.     Neta Mends. Panosh, MD  Electronically Signed    WKP/MedQ  DD: 08/20/2006  DT: 08/20/2006  Job #: [id number]

## 2011-03-21 ENCOUNTER — Encounter: Payer: Self-pay | Admitting: Family Medicine

## 2011-03-22 ENCOUNTER — Encounter: Payer: Self-pay | Admitting: *Deleted

## 2011-04-19 ENCOUNTER — Other Ambulatory Visit: Payer: Self-pay | Admitting: *Deleted

## 2011-04-19 ENCOUNTER — Telehealth: Payer: Self-pay | Admitting: *Deleted

## 2011-04-19 MED ORDER — DESLORATADINE-PSEUDOEPHED ER 2.5-120 MG PO TB12: 1.0000 | ORAL_TABLET | Freq: Two times a day (BID) | ORAL | Status: DC

## 2011-04-19 NOTE — Telephone Encounter (Signed)
Jillian [name], was this script for clarinex sent to [facility]?  Looks like script was printed.

## 2011-04-19 NOTE — Telephone Encounter (Signed)
Laurie-I sent it to Dr. Dayton Martes for approval. It wasn't sent back to me so, I haven't done anything else with it. To me it looks like Jillian [name] called it in.

## 2011-04-19 NOTE — Telephone Encounter (Signed)
Rx called to [facility].

## 2011-04-19 NOTE — Telephone Encounter (Signed)
Okay to refill? 

## 2011-05-29 LAB — CBC
HCT: 36.2
Hemoglobin: 10.5 — ABNORMAL LOW
MCHC: 33.6
MCHC: 34.3
MCV: 86.1
Platelets: 222
Platelets: 253
RBC: 4.21
RDW: 15.4 — ABNORMAL HIGH

## 2011-05-29 LAB — PREGNANCY, URINE: Preg Test, Ur: NEGATIVE

## 2011-06-28 ENCOUNTER — Other Ambulatory Visit: Payer: Self-pay | Admitting: Family Medicine

## 2011-06-28 DIAGNOSIS — Z0000 Encounter for general adult medical examination without abnormal findings: Secondary | ICD-10-CM

## 2011-06-28 DIAGNOSIS — Z136 Encounter for screening for cardiovascular disorders: Secondary | ICD-10-CM

## 2011-06-28 DIAGNOSIS — E559 Vitamin D deficiency, unspecified: Secondary | ICD-10-CM

## 2011-07-05 ENCOUNTER — Other Ambulatory Visit (INDEPENDENT_AMBULATORY_CARE_PROVIDER_SITE_OTHER)

## 2011-07-05 DIAGNOSIS — Z136 Encounter for screening for cardiovascular disorders: Secondary | ICD-10-CM

## 2011-07-05 DIAGNOSIS — E559 Vitamin D deficiency, unspecified: Secondary | ICD-10-CM

## 2011-07-05 DIAGNOSIS — Z0000 Encounter for general adult medical examination without abnormal findings: Secondary | ICD-10-CM

## 2011-07-05 LAB — BASIC METABOLIC PANEL
CO2: 27 mEq/L (ref 19–32)
Calcium: 8.9 mg/dL (ref 8.4–10.5)
Glucose, Bld: 77 mg/dL (ref 70–99)
Sodium: 141 mEq/L (ref 135–145)

## 2011-07-05 LAB — LIPID PANEL
Cholesterol: 237 mg/dL — ABNORMAL HIGH (ref 0–200)
Triglycerides: 22 mg/dL (ref 0.0–149.0)

## 2011-07-16 ENCOUNTER — Encounter: Payer: Self-pay | Admitting: Family Medicine

## 2011-07-16 ENCOUNTER — Ambulatory Visit (INDEPENDENT_AMBULATORY_CARE_PROVIDER_SITE_OTHER): Admitting: Family Medicine

## 2011-07-16 ENCOUNTER — Other Ambulatory Visit: Payer: Self-pay | Admitting: *Deleted

## 2011-07-16 VITALS — BP 122/82 | HR 64 | Temp 97.8°F | Wt 174.8 lb

## 2011-07-16 DIAGNOSIS — K5900 Constipation, unspecified: Secondary | ICD-10-CM | POA: Insufficient documentation

## 2011-07-16 DIAGNOSIS — Z0000 Encounter for general adult medical examination without abnormal findings: Secondary | ICD-10-CM

## 2011-07-16 DIAGNOSIS — Z23 Encounter for immunization: Secondary | ICD-10-CM

## 2011-07-16 LAB — TSH: TSH: 2.73 u[IU]/mL (ref 0.35–5.50)

## 2011-07-16 LAB — T4, FREE: Free T4: 0.89 ng/dL (ref 0.60–1.60)

## 2011-07-16 MED ORDER — ESTRADIOL 0.025 MG/24HR TD PTTW: 1.0000 | MEDICATED_PATCH | TRANSDERMAL | Status: DC

## 2011-07-16 MED ORDER — FLUTICASONE PROPIONATE 50 MCG/ACT NA SUSP: 1.0000 | Freq: Every day | NASAL | Status: DC | PRN

## 2011-07-16 MED ORDER — DESLORATADINE-PSEUDOEPHED ER 2.5-120 MG PO TB12: 1.0000 | ORAL_TABLET | Freq: Two times a day (BID) | ORAL | Status: DC

## 2011-07-16 NOTE — Progress Notes (Signed)
Addended by: Gilmer Mor on: 07/16/2011 10:12 AM   Modules accepted: Orders

## 2011-07-16 NOTE — Progress Notes (Signed)
55 yo here for CPX with no complaints.   Lab Results  Component Value Date   CHOL 237* 07/05/2011   HDL 97.80 07/05/2011   LDLCALC 115* 02/04/2007   LDLDIRECT 139.7 07/05/2011   TRIG 22.0 07/05/2011   CHOLHDL 2 07/05/2011    She is very physically active and tries to eat a very low fat, high fiber diet.  UTD on all prevention.   S/p complete hysterectomy for fibroid tumors.   Having some issues with constipation, hair is also dry. No blood in stool. No FH of colon CA. Normal colonoscopy in 2008.  Of note, she has been eating less fruit to bring her fasting CBG down, which she has from 100 to 77.   Review of Systems  See HPI  Patient reports no  vision/ hearing changes,anorexia, weight change, fever ,adenopathy, persistant / recurrent hoarseness, swallowing issues, chest pain, edema,persistant / recurrent cough, hemoptysis, dyspnea(rest, exertional, paroxysmal nocturnal), gastrointestinal  bleeding (melena, rectal bleeding), abdominal pain, excessive heart burn, GU symptoms(dysuria, hematuria, pyuria, voiding/incontinence  Issues) syncope, focal weakness, severe memory loss, concerning skin lesions, depression, anxiety, abnormal bruising/bleeding, major joint swelling, breast masses or abnormal vaginal bleeding.    Physical Exam  BP 122/82  Pulse 64  Temp(Src) 97.8 F (36.6 C) (Oral)  Wt 174 lb 12 oz (79.266 kg) General: alert, well-developed, well-nourished, and well-hydrated.  Head: normocephalic and atraumatic.  Eyes: vision grossly intact, pupils equal, pupils round, and pupils reactive to light.  Ears: R ear normal and L ear normal.  Nose: no external deformity.  Mouth: good dentition.  Neck: No deformities, masses, or tenderness noted.  Breasts: left breast- skin/areolae normal and no abnormal thickening, no masses  Lungs: normal respiratory effort, no intercostal retractions, no accessory muscle use, and normal breath sounds.  Heart: normal rate, regular rhythm, and  no murmur.  Abdomen: Bowel sounds positive,abdomen soft and non-tender without masses, organomegaly or hernias noted.  Rectal: no external abnormalities.  Genitalia: normal introitus, no external lesions, and no vaginal discharge.  Msk: No deformity or scoliosis noted of thoracic or lumbar spine.  Extremities: no edema  Neurologic: alert & oriented X3 and gait normal.  Skin: Intact without suspicious lesions or rashes  Psych: normally interactive and good eye contact.   Assessment and Plan:  1. Routine general medical examination at a health care facility  Reviewed preventive care protocols, scheduled due services, and updated immunizations Discussed nutrition, exercise, diet, and healthy lifestyle.  Flu shot IFOB  2. Constipation  Likely due to decreased fruit consumption. Will check IFOB and thyroid panel today. The patient indicates understanding of these issues and agrees with the plan.  TSH, T4, free

## 2011-07-16 NOTE — Patient Instructions (Signed)
Good to see you!    Have a wonderful holiday!

## 2011-07-17 MED ORDER — DESLORATADINE-PSEUDOEPHED ER 2.5-120 MG PO TB12: 1.0000 | ORAL_TABLET | Freq: Two times a day (BID) | ORAL | Status: DC

## 2011-07-17 MED ORDER — FLUTICASONE PROPIONATE 50 MCG/ACT NA SUSP: 1.0000 | Freq: Every day | NASAL | Status: DC | PRN

## 2011-07-17 NOTE — Telephone Encounter (Signed)
Addended by: Gilmer Mor on: 07/17/2011 11:40 AM   Modules accepted: Orders

## 2011-07-17 NOTE — Telephone Encounter (Signed)
Addended by: Gilmer Mor on: 07/17/2011 11:39 AM   Modules accepted: Orders

## 2011-07-19 ENCOUNTER — Other Ambulatory Visit: Payer: Self-pay | Admitting: *Deleted

## 2011-07-19 MED ORDER — ESTRADIOL 0.025 MG/24HR TD PTTW: 1.0000 | MEDICATED_PATCH | TRANSDERMAL | Status: DC

## 2011-07-19 MED ORDER — DESLORATADINE-PSEUDOEPHED ER 2.5-120 MG PO TB12: 1.0000 | ORAL_TABLET | Freq: Two times a day (BID) | ORAL | Status: DC

## 2011-07-19 MED ORDER — FLUTICASONE PROPIONATE 50 MCG/ACT NA SUSP: 1.0000 | Freq: Every day | NASAL | Status: DC | PRN

## 2011-07-19 NOTE — Telephone Encounter (Signed)
I called patient to verify which mail order pharmacy she would like her Rx's sent to.  [facility] by mail.  She advised me that this company does not have E-prescribing so I will have to fax the Rx's.  Fax number [phone].  Rx's were faxed today.

## 2011-07-24 ENCOUNTER — Other Ambulatory Visit: Payer: Self-pay | Admitting: Family Medicine

## 2011-07-24 ENCOUNTER — Other Ambulatory Visit

## 2011-07-24 ENCOUNTER — Encounter: Payer: Self-pay | Admitting: *Deleted

## 2011-07-24 DIAGNOSIS — Z1211 Encounter for screening for malignant neoplasm of colon: Secondary | ICD-10-CM

## 2012-01-08 ENCOUNTER — Encounter: Payer: Self-pay | Admitting: Gastroenterology

## 2012-01-31 ENCOUNTER — Ambulatory Visit (INDEPENDENT_AMBULATORY_CARE_PROVIDER_SITE_OTHER): Admitting: Family Medicine

## 2012-01-31 ENCOUNTER — Encounter: Payer: Self-pay | Admitting: Family Medicine

## 2012-01-31 VITALS — BP 110/84 | Temp 97.9°F | Wt 181.0 lb

## 2012-01-31 DIAGNOSIS — J069 Acute upper respiratory infection, unspecified: Secondary | ICD-10-CM

## 2012-01-31 MED ORDER — HYDROCOD POLST-CHLORPHEN POLST 10-8 MG/5ML PO LQCR: 5.0000 mL | Freq: Every evening | ORAL | Status: DC | PRN

## 2012-01-31 NOTE — Progress Notes (Signed)
SUBJECTIVE:  Jillian [name] is a 56 y.o. female who complains of :  Nausea, vomiting and dizziness.  Started immediately after she swallowed magic mouthwash with lidocaine that she received from urgent care.  Those symptoms have resolved now that she stopped taking it.  Went to UC three days ago for coryza, congestion, sneezing, sore throat, dry cough and hoarseness for 3 days.   She was given amoxicillin and Magic mouthwash.  Today, feels better but still hoarse and cannot sleep at night due to coughing spells.  Patient Active Problem List  Diagnosis  . FIBROIDS, UTERUS  . UNSPECIFIED VITAMIN D DEFICIENCY  . URI  . ALLERGIC RHINITIS  . JAW PAIN  . BREAST MASS, LEFT  . FRACTURE, ANKLE, RIGHT  . POSTMENOPAUSAL STATUS  . Routine general medical examination at a health care facility  . Constipation   No past medical history on file. No past surgical history on file. History  Substance Use Topics  . Smoking status: Never Smoker   . Smokeless tobacco: Not on file  . Alcohol Use: Not on file   No family history on file. No Known Allergies Current Outpatient Prescriptions on File Prior to Visit  Medication Sig Dispense Refill  . desloratadine-pseudoephedrine (CLARINEX-D 12-HOUR) 2.5-120 MG per tablet Take 1 tablet by mouth 2 (two) times daily.  90 tablet  3  . diclofenac (VOLTAREN) 75 MG EC tablet Take 75 mg by mouth 2 (two) times daily.        Marland Kitchen estradiol (VIVELLE-DOT) 0.025 MG/24HR Place 1 patch onto the skin 2 (two) times a week.  24 patch  3  . fluticasone (FLONASE) 50 MCG/ACT nasal spray Place 1 spray into the nose daily as needed.  16 g  3  . DISCONTD: fluticasone (FLONASE) 50 MCG/ACT nasal spray Place 1 spray into the nose daily as needed.  16 g  3   The PMH, PSH, Social History, Family History, Medications, and allergies have been reviewed in [facility], and have been updated if relevant.   OBJECTIVE: BP 110/84  Temp 97.9 F (36.6 C)  Wt 181 lb (82.101 kg)  She appears  well, vital signs are as noted.  +laryngitis Ears normal.  Throat and pharynx normal.  Neck supple. No adenopathy in the neck. Nose is congested. Sinuses non tender. The chest is clear, without wheezes or rales.  ASSESSMENT:  upper respiratory illness  PLAN: Finish abx as precribed by UC.  Consider decadron IM for laryngitis if persists. Given rx for tussionex to use at night. Symptomatic therapy suggested: push fluids, rest and return office visit prn if symptoms persist or worsen. Call or return to clinic prn if these symptoms worsen or fail to improve as anticipated.

## 2012-01-31 NOTE — Patient Instructions (Addendum)
Call me in 1-2 days with an update of your symptoms.

## 2012-03-10 ENCOUNTER — Other Ambulatory Visit: Payer: Self-pay

## 2012-03-10 MED ORDER — DESLORATADINE-PSEUDOEPHED ER 2.5-120 MG PO TB12: 1.0000 | ORAL_TABLET | Freq: Two times a day (BID) | ORAL | Status: DC

## 2012-03-10 NOTE — Telephone Encounter (Signed)
Pt request one month Clarinex D to [facility] [place] due to [facility] being out of stock. Pt notified done while on phone.

## 2012-03-24 ENCOUNTER — Encounter: Payer: Self-pay | Admitting: Family Medicine

## 2012-03-24 ENCOUNTER — Encounter: Payer: Self-pay | Admitting: *Deleted

## 2012-03-24 LAB — HM MAMMOGRAPHY: HM Mammogram: NORMAL

## 2012-05-28 ENCOUNTER — Other Ambulatory Visit: Payer: Self-pay | Admitting: *Deleted

## 2012-05-28 ENCOUNTER — Ambulatory Visit (INDEPENDENT_AMBULATORY_CARE_PROVIDER_SITE_OTHER): Admitting: Family Medicine

## 2012-05-28 ENCOUNTER — Encounter: Payer: Self-pay | Admitting: Family Medicine

## 2012-05-28 ENCOUNTER — Encounter: Payer: Self-pay | Admitting: Gastroenterology

## 2012-05-28 VITALS — BP 134/86 | HR 72 | Temp 98.0°F | Wt 183.0 lb

## 2012-05-28 DIAGNOSIS — Z23 Encounter for immunization: Secondary | ICD-10-CM

## 2012-05-28 DIAGNOSIS — K5900 Constipation, unspecified: Secondary | ICD-10-CM

## 2012-05-28 MED ORDER — DESLORATADINE-PSEUDOEPHED ER 2.5-120 MG PO TB12: 1.0000 | ORAL_TABLET | Freq: Two times a day (BID) | ORAL | Status: DC

## 2012-05-28 MED ORDER — FLUTICASONE PROPIONATE 50 MCG/ACT NA SUSP: 1.0000 | Freq: Every day | NASAL | Status: DC | PRN

## 2012-05-28 MED ORDER — ESTRADIOL 0.025 MG/24HR TD PTTW: 1.0000 | MEDICATED_PATCH | TRANSDERMAL | Status: DC

## 2012-05-28 NOTE — Progress Notes (Signed)
56 yo here to discuss intermittent constipation.  She is very physically active and tries to eat a very low fat, high fiber diet.   No blood in stool. No FH of colon CA. Normal colonoscopy in 2008 (Dr. Russella Dar).  Lab Results  Component Value Date   TSH 2.73 07/16/2011   Has been having symptoms since 07/2011. IFOB neg in 07/2011.  Added probiotics, increased fruits and vegetables.   Has tried laxatives but she feels lit makes it worse.  She used to have a bowel movement daily, now having one every few days and feels stool is very hard. Also with increased gasiness. No abdominal pain. No nausea or vomiting.  Patient Active Problem List  Diagnosis  . FIBROIDS, UTERUS  . UNSPECIFIED VITAMIN D DEFICIENCY  . URI  . ALLERGIC RHINITIS  . JAW PAIN  . BREAST MASS, LEFT  . FRACTURE, ANKLE, RIGHT  . POSTMENOPAUSAL STATUS  . Routine general medical examination at a health care facility  . Constipation  . Constipation   No past medical history on file. No past surgical history on file. History  Substance Use Topics  . Smoking status: Former Games developer  . Smokeless tobacco: Not on file  . Alcohol Use: Not on file   No family history on file. No Known Allergies Current Outpatient Prescriptions on File Prior to Visit  Medication Sig Dispense Refill  . desloratadine-pseudoephedrine (CLARINEX-D 12-HOUR) 2.5-120 MG per tablet Take 1 tablet by mouth 2 (two) times daily.  60 tablet  0  . diclofenac (VOLTAREN) 75 MG EC tablet Take 75 mg by mouth 2 (two) times daily.        Marland Kitchen estradiol (VIVELLE-DOT) 0.025 MG/24HR Place 1 patch onto the skin 2 (two) times a week.  24 patch  3  . fluticasone (FLONASE) 50 MCG/ACT nasal spray Place 1 spray into the nose daily as needed.  16 g  3   The PMH, PSH, Social History, Family History, Medications, and allergies have been reviewed in [facility], and have been updated if relevant.  Review of Systems  See HPI   Physical Exam  Wt 183 lb (83.008 kg) BP 134/86   Pulse 72  Temp 98 F (36.7 C)  Wt 183 lb (83.008 kg)  General: alert, well-developed, well-nourished, and well-hydrated.  Head: normocephalic and atraumatic.  Lungs: normal respiratory effort, no intercostal retractions, no accessory muscle use, and normal breath sounds.  Heart: normal rate, regular rhythm, and no murmur.  Abdomen: Bowel sounds positive,abdomen soft and non-tender without masses, organomegaly or hernias noted.  Neurologic: alert & oriented X3 and gait normal.  Skin: Intact without suspicious lesions or rashes  Psych: normally interactive and good eye contact.   Assessment and Plan:   1. Constipation  Deteriorated. Given changes in bowel habits and duration of symptoms, will refer to GI for further work up/poassible repeat colonoscopy. Add colace. The patient indicates understanding of these issues and agrees with the plan.  Ambulatory referral to Gastroenterology  2. Need for prophylactic vaccination and inoculation against influenza  Flu vaccine greater than or equal to 3yo preservative free IM

## 2012-05-28 NOTE — Patient Instructions (Addendum)
Good to see you. Let's try Colace- follow directions on bottle. Please stop by to see Shirlee Limerick on your way out- set up an appointment with the stomach doctor.

## 2012-05-30 ENCOUNTER — Other Ambulatory Visit: Payer: Self-pay | Admitting: *Deleted

## 2012-05-30 MED ORDER — FLUTICASONE PROPIONATE 50 MCG/ACT NA SUSP: 1.0000 | Freq: Every day | NASAL | Status: DC | PRN

## 2012-05-30 MED ORDER — ESTRADIOL 0.025 MG/24HR TD PTTW: 1.0000 | MEDICATED_PATCH | TRANSDERMAL | Status: DC

## 2012-05-30 MED ORDER — DESLORATADINE-PSEUDOEPHED ER 2.5-120 MG PO TB12: 1.0000 | ORAL_TABLET | Freq: Two times a day (BID) | ORAL | Status: DC

## 2012-05-30 NOTE — Telephone Encounter (Signed)
Pt called with phone number to her mail order company to have meds sent in. I called company and was given their info for escribing. I updated pt's pharmacy and sent her rx's electronically.

## 2012-06-25 ENCOUNTER — Encounter: Payer: Self-pay | Admitting: Gastroenterology

## 2012-06-25 ENCOUNTER — Ambulatory Visit (INDEPENDENT_AMBULATORY_CARE_PROVIDER_SITE_OTHER): Admitting: Gastroenterology

## 2012-06-25 VITALS — BP 118/80 | HR 78 | Ht 65.25 in | Wt 182.6 lb

## 2012-06-25 DIAGNOSIS — K5900 Constipation, unspecified: Secondary | ICD-10-CM

## 2012-06-25 MED ORDER — PEG-KCL-NACL-NASULF-NA ASC-C 100 G PO SOLR: 1.0000 | Freq: Once | ORAL | Status: DC

## 2012-06-25 NOTE — Patient Instructions (Addendum)
You have been scheduled for a colonoscopy with propofol. Please follow written instructions given to you at your visit today.  Please pick up your prep kit at the pharmacy within the next 1-3 days. If you use inhalers (even only as needed) or a CPAP machine, please bring them with you on the day of your procedure.  Start over the counter Miralax mixing 17 grams in 8 oz of water 1-2 x daily to titrate depending on bowel movements

## 2012-06-25 NOTE — Progress Notes (Signed)
History of Present Illness: This is a 56 year old female who relates worsening constipation for over one year. She is accompanied by her husband. She has tried increased fiber, increased fluids, probiotics and stool softeners without any significant effect. She states she has a bowel movement every 3-7 days however she frequently needs to use Ducolax pills. She notes mild right-sided abdominal discomfort when she is constipated. She feels like she incompletely evacuates. Stool Hemoccult testing was negative in December 2012. She underwent colonoscopy in 2008 which was normal. Denies weight loss, diarrhea, change in stool caliber, melena, hematochezia, nausea, vomiting, dysphagia, reflux symptoms, chest pain.  Review of Systems: Pertinent positive and negative review of systems were noted in the above HPI section. All other review of systems were otherwise negative.  Current Medications, Allergies, Past Medical History, Past Surgical History, Family History and Social History were reviewed in [employer] record.  Physical Exam: General: Well developed , well nourished, no acute distress Head: Normocephalic and atraumatic Eyes:  sclerae anicteric, EOMI Ears: Normal auditory acuity Mouth: No deformity or lesions Neck: Supple, no masses or thyromegaly Lungs: Clear throughout to auscultation Heart: Regular rate and rhythm; no murmurs, rubs or bruits Abdomen: Soft, non tender and non distended. No masses, hepatosplenomegaly or hernias noted. Normal Bowel sounds Rectal: Deferred to colonoscopy Musculoskeletal: Symmetrical with no gross deformities  Skin: No lesions on visible extremities Pulses:  Normal pulses noted Extremities: No clubbing, cyanosis, edema or deformities noted Neurological: Alert oriented x 4, grossly nonfocal Cervical Nodes:  No significant cervical adenopathy Inguinal Nodes: No significant inguinal adenopathy Psychological:  Alert and cooperative. Normal  mood and affect  Assessment and Recommendations:  1. Chronic constipation. Not responsive to dietary and first-line therapies. Begin MiraLax once or twice daily titrated for adequate bowel movements. Ducolax pills for constipation refractory to MiraLax. Rule out obstructing colonic lesions or other colonic disease contributing to her symptoms. Schedule colonoscopy. The risks, benefits, and alternatives to colonoscopy with possible biopsy and possible polypectomy were discussed with the patient and they consent to proceed.

## 2012-07-08 ENCOUNTER — Other Ambulatory Visit

## 2012-07-10 ENCOUNTER — Other Ambulatory Visit: Payer: Self-pay | Admitting: Family Medicine

## 2012-07-10 DIAGNOSIS — Z136 Encounter for screening for cardiovascular disorders: Secondary | ICD-10-CM

## 2012-07-10 DIAGNOSIS — Z0000 Encounter for general adult medical examination without abnormal findings: Secondary | ICD-10-CM

## 2012-07-10 DIAGNOSIS — E559 Vitamin D deficiency, unspecified: Secondary | ICD-10-CM

## 2012-07-16 ENCOUNTER — Encounter: Admitting: Family Medicine

## 2012-07-22 ENCOUNTER — Other Ambulatory Visit (INDEPENDENT_AMBULATORY_CARE_PROVIDER_SITE_OTHER)

## 2012-07-22 DIAGNOSIS — E559 Vitamin D deficiency, unspecified: Secondary | ICD-10-CM

## 2012-07-22 DIAGNOSIS — Z0000 Encounter for general adult medical examination without abnormal findings: Secondary | ICD-10-CM

## 2012-07-22 DIAGNOSIS — Z136 Encounter for screening for cardiovascular disorders: Secondary | ICD-10-CM

## 2012-07-22 LAB — COMPREHENSIVE METABOLIC PANEL
Albumin: 4 g/dL (ref 3.5–5.2)
BUN: 16 mg/dL (ref 6–23)
Calcium: 8.9 mg/dL (ref 8.4–10.5)
Chloride: 103 mEq/L (ref 96–112)
GFR: 138.01 mL/min (ref >60.00)
Glucose, Bld: 93 mg/dL (ref 70–99)
Potassium: 3.9 mEq/L (ref 3.5–5.1)

## 2012-07-22 LAB — LDL CHOLESTEROL, DIRECT: Direct LDL: 127.9 mg/dL

## 2012-07-22 LAB — LIPID PANEL: HDL: 93.7 mg/dL (ref >39.00)

## 2012-07-23 LAB — VITAMIN D 25 HYDROXY (VIT D DEFICIENCY, FRACTURES): Vit D, 25-Hydroxy: 38 ng/mL (ref 30–89)

## 2012-07-24 ENCOUNTER — Ambulatory Visit (AMBULATORY_SURGERY_CENTER): Admitting: Gastroenterology

## 2012-07-24 ENCOUNTER — Encounter: Payer: Self-pay | Admitting: Gastroenterology

## 2012-07-24 VITALS — BP 134/89 | HR 67 | Temp 97.7°F | Resp 16 | Ht 65.0 in | Wt 182.0 lb

## 2012-07-24 DIAGNOSIS — R198 Other specified symptoms and signs involving the digestive system and abdomen: Secondary | ICD-10-CM

## 2012-07-24 DIAGNOSIS — K5900 Constipation, unspecified: Secondary | ICD-10-CM

## 2012-07-24 MED ORDER — SODIUM CHLORIDE 0.9 % IV SOLN: 500.0000 mL | INTRAVENOUS | Status: DC

## 2012-07-24 NOTE — Progress Notes (Signed)
Unable to navigate the colon with adult scope due to a very twisty colon so switched to peds scope per dr stark and able to reach cecum with difficulty. Pt tolerated procedure well. ewm

## 2012-07-24 NOTE — Progress Notes (Signed)
Patient did not experience any of the following events: a burn prior to discharge; a fall within the facility; wrong site/side/patient/procedure/implant event; or a hospital transfer or hospital admission upon discharge from the facility. (G8907) Patient did not have preoperative order for IV antibiotic SSI prophylaxis. (G8918)  

## 2012-07-24 NOTE — Progress Notes (Signed)
IV right antecubital unsuccesful per Ardeen Jourdain RN.

## 2012-07-24 NOTE — Patient Instructions (Addendum)
YOU HAD AN ENDOSCOPIC PROCEDURE TODAY AT THE [facility]: Refer to the procedure report that was given to you for any specific questions about what was found during the examination.  If the procedure report does not answer your questions, please call your gastroenterologist to clarify.  If you requested that your care partner not be given the details of your procedure findings, then the procedure report has been included in a sealed envelope for you to review at your convenience later.  YOU SHOULD EXPECT: Some feelings of bloating in the abdomen. Passage of more gas than usual.  Walking can help get rid of the air that was put into your GI tract during the procedure and reduce the bloating. If you had a lower endoscopy (such as a colonoscopy or flexible sigmoidoscopy) you may notice spotting of blood in your stool or on the toilet paper. If you underwent a bowel prep for your procedure, then you may not have a normal bowel movement for a few days.  DIET: Your first meal following the procedure should be a light meal and then it is ok to progress to your normal diet.  A half-sandwich or bowl of soup is an example of a good first meal.  Heavy or fried foods are harder to digest and may make you feel nauseous or bloated.  Likewise meals heavy in dairy and vegetables can cause extra gas to form and this can also increase the bloating.  Drink plenty of fluids but you should avoid alcoholic beverages for 24 hours.  ACTIVITY: Your care partner should take you home directly after the procedure.  You should plan to take it easy, moving slowly for the rest of the day.  You can resume normal activity the day after the procedure however you should NOT DRIVE or use heavy machinery for 24 hours (because of the sedation medicines used during the test).    SYMPTOMS TO REPORT IMMEDIATELY: A gastroenterologist can be reached at any hour.  During normal business hours, 8:30 AM to 5:00 PM Monday through Friday,  call [phone].  After hours and on weekends, please call the GI answering service at [phone] who will take a message and have the physician on call contact you.   Following lower endoscopy (colonoscopy or flexible sigmoidoscopy):  Excessive amounts of blood in the stool  Significant tenderness or worsening of abdominal pains  Swelling of the abdomen that is new, acute  Fever of 100F or higher  Following upper endoscopy (EGD)  Vomiting of blood or coffee ground material  New chest pain or pain under the shoulder blades  Painful or persistently difficult swallowing  New shortness of breath  Fever of 100F or higher  Black, tarry-looking stools  FOLLOW UP: If any biopsies were taken you will be contacted by phone or by letter within the next 1-3 weeks.  Call your gastroenterologist if you have not heard about the biopsies in 3 weeks.  Our staff will call the home number listed on your records the next business day following your procedure to check on you and address any questions or concerns that you may have at that time regarding the information given to you following your procedure. This is a courtesy call and so if there is no answer at the home number and we have not heard from you through the emergency physician on call, we will assume that you have returned to your regular daily activities without incident.  SIGNATURES/CONFIDENTIALITY: You and/or your care   partner have signed paperwork which will be entered into your electronic medical record.  These signatures attest to the fact that that the information above on your After Visit Summary has been reviewed and is understood.  Full responsibility of the confidentiality of this discharge information lies with you and/or your care-partner.  

## 2012-07-24 NOTE — Progress Notes (Signed)
Propofol given over incremental dosages 

## 2012-07-24 NOTE — Op Note (Signed)
[facility] [address] [place], [address]   COLONOSCOPY PROCEDURE REPORT  PATIENT: Jillian [name], Jillian [name]  MR#: [id number] BIRTHDATE: 1956/05/10 , 56  yrs. old GENDER: Female ENDOSCOPIST: Meryl Dare, MD, [facility] PROCEDURE DATE:  07/24/2012 PROCEDURE:   Colonoscopy, diagnostic ASA CLASS:   Class II INDICATIONS:Constipation and Change in bowel habits. MEDICATIONS: MAC sedation, administered by CRNA and propofol (Diprivan) 350mg  IV DESCRIPTION OF PROCEDURE:   After the risks benefits and alternatives of the procedure were thoroughly explained, informed consent was obtained.  A digital rectal exam revealed no abnormalities of the rectum.   The LB CF-H180AL [id number] and LB PCF-H180AL [id number]  endoscope was introduced through the anus and advanced to the cecum, which was identified by both the appendix and ileocecal valve. No adverse events experienced with a tortuous colon noted.  The quality of the prep was excellent, using MoviPrep The instrument was then slowly withdrawn as the colon was fully examined.  COLON FINDINGS: A normal appearing cecum, ileocecal valve, and appendiceal orifice were identified.  The ascending, hepatic flexure, transverse, splenic flexure, descending, sigmoid colon and rectum appeared unremarkable.  No polyps or cancers were seen. Retroflexed views revealed small internal hemorrhoids. The time to cecum=3 minutes 33 seconds.  Withdrawal time=7 minutes 45 seconds. The scope was withdrawn and the procedure completed.  COMPLICATIONS: There were no complications.  ENDOSCOPIC IMPRESSION: 1.  Normal colon 2.  Small internal hemorrhoids  RECOMMENDATIONS: 1.  Continue current colorectal screening recommendations for "routine risk" patients with a repeat colonoscopy in 10 years. 2.  High fiber diet with liberal fluid intake. 3.  Miralax titrate 1-3 times daily to need  eSigned:  Meryl Dare, MD, [facility] 07/24/2012 3:00 PM

## 2012-07-25 ENCOUNTER — Telehealth: Payer: Self-pay | Admitting: *Deleted

## 2012-07-28 ENCOUNTER — Encounter: Payer: Self-pay | Admitting: Family Medicine

## 2012-07-28 ENCOUNTER — Ambulatory Visit (INDEPENDENT_AMBULATORY_CARE_PROVIDER_SITE_OTHER): Admitting: Family Medicine

## 2012-07-28 VITALS — BP 140/82 | HR 72 | Temp 97.9°F | Ht 65.75 in | Wt 180.0 lb

## 2012-07-28 DIAGNOSIS — K5900 Constipation, unspecified: Secondary | ICD-10-CM

## 2012-07-28 DIAGNOSIS — Z0000 Encounter for general adult medical examination without abnormal findings: Secondary | ICD-10-CM

## 2012-07-28 DIAGNOSIS — E559 Vitamin D deficiency, unspecified: Secondary | ICD-10-CM

## 2012-07-28 MED ORDER — FLUTICASONE PROPIONATE 50 MCG/ACT NA SUSP: 1.0000 | Freq: Every day | NASAL | Status: DC | PRN

## 2012-07-28 MED ORDER — ESTRADIOL 0.025 MG/24HR TD PTTW: 1.0000 | MEDICATED_PATCH | TRANSDERMAL | Status: DC

## 2012-07-28 MED ORDER — DICLOFENAC SODIUM 75 MG PO TBEC: 75.0000 mg | DELAYED_RELEASE_TABLET | Freq: Two times a day (BID) | ORAL | Status: DC

## 2012-07-28 NOTE — Progress Notes (Signed)
56 yo here for CPX with no complaints.   Lab Results  Component Value Date   CHOL 234* 07/22/2012   HDL 93.70 07/22/2012   LDLCALC 115* 02/04/2007   LDLDIRECT 127.9 07/22/2012   TRIG 32.0 07/22/2012   CHOLHDL 2 07/22/2012    She is very physically active and tries to eat a very low fat, high fiber diet.  UTD on all prevention.   Colonoscopy normal this month.  S/p complete hysterectomy for fibroid tumors. On vivelle-DOT patch for hot flashes, vaginal dryness.  She feels this is controlling her symptoms well. Denies any post menopausal bleeding.   Started Miralax for constipation which seems to be working well.  Patient Active Problem List  Diagnosis  . FIBROIDS, UTERUS  . UNSPECIFIED VITAMIN D DEFICIENCY  . URI  . ALLERGIC RHINITIS  . JAW PAIN  . BREAST MASS, LEFT  . FRACTURE, ANKLE, RIGHT  . POSTMENOPAUSAL STATUS  . Routine general medical examination at a health care facility  . Constipation   Past Medical History  Diagnosis Date  . Allergic rhinitis   . Vitamin D deficiency    Past Surgical History  Procedure Date  . Abdominal hysterectomy    History  Substance Use Topics  . Smoking status: Former Smoker    Types: Cigarettes  . Smokeless tobacco: Never Used  . Alcohol Use: No   Family History  Problem Relation Age of Onset  . Colon cancer Sister   . Colon polyps Sister   . Diabetes Mother     father and 9 siblings  . Heart disease Father     paternal grandmother  . Kidney disease Mother     renal failure   No Known Allergies Current Outpatient Prescriptions on File Prior to Visit  Medication Sig Dispense Refill  . desloratadine-pseudoephedrine (CLARINEX-D 12-HOUR) 2.5-120 MG per tablet Take 1 tablet by mouth 2 (two) times daily.  180 tablet  0  . [DISCONTINUED] estradiol (VIVELLE-DOT) 0.025 MG/24HR Place 1 patch onto the skin 2 (two) times a week.  24 patch  0  . [DISCONTINUED] fluticasone (FLONASE) 50 MCG/ACT nasal spray Place 1 spray into the nose  daily as needed.  48 g  0   The PMH, PSH, Social History, Family History, Medications, and allergies have been reviewed in [facility], and have been updated if relevant.    Review of Systems  See HPI  Patient reports no  vision/ hearing changes,anorexia, weight change, fever ,adenopathy, persistant / recurrent hoarseness, swallowing issues, chest pain, edema,persistant / recurrent cough, hemoptysis, dyspnea(rest, exertional, paroxysmal nocturnal), gastrointestinal  bleeding (melena, rectal bleeding), abdominal pain, excessive heart burn, GU symptoms(dysuria, hematuria, pyuria, voiding/incontinence  Issues) syncope, focal weakness, severe memory loss, concerning skin lesions, depression, anxiety, abnormal bruising/bleeding, major joint swelling, breast masses or abnormal vaginal bleeding.    Physical Exam  BP 140/82  Pulse 72  Temp 97.9 F (36.6 C)  Ht 5' 5.75" (1.67 m)  Wt 180 lb (81.647 kg)  BMI 29.27 kg/m2  General:  Well-developed,well-nourished,in no acute distress; alert,appropriate and cooperative throughout examination Head:  normocephalic and atraumatic.   Eyes:  vision grossly intact, pupils equal, pupils round, and pupils reactive to light.   Ears:  R ear normal and L ear normal.   Nose:  no external deformity.   Mouth:  good dentition.   Neck:  No deformities, masses, or tenderness noted. Breasts:  No mass, nodules, thickening, tenderness, bulging, retraction, inflamation, nipple discharge or skin changes noted.   Lungs:  Normal  respiratory effort, chest expands symmetrically. Lungs are clear to auscultation, no crackles or wheezes. Heart:  Normal rate and regular rhythm. S1 and S2 normal without gallop, murmur, click, rub or other extra sounds. Abdomen:  Bowel sounds positive,abdomen soft and non-tender without masses, organomegaly or hernias noted. Rectal:  no external abnormalities.   Genitalia:  Pelvic Exam:        External: normal female genitalia without lesions or masses         Vagina: normal without lesions or masses        Cervix: absent        Adnexa: absent        Uterus: absent       Msk:  No deformity or scoliosis noted of thoracic or lumbar spine.   Extremities:  No clubbing, cyanosis, edema, or deformity noted with normal full range of motion of all joints.   Neurologic:  alert & oriented X3 and gait normal.   Skin:  Intact without suspicious lesions or rashes Cervical Nodes:  No lymphadenopathy noted Axillary Nodes:  No palpable lymphadenopathy Psych:  Cognition and judgment appear intact. Alert and cooperative with normal attention span and concentration. No apparent delusions, illusions, hallucinations    Assessment and Plan:  1. Routine general medical examination at a health care facility  Reviewed preventive care protocols, scheduled due services, and updated immunizations Discussed nutrition, exercise, diet, and healthy lifestyle.     2. Constipation  Improved with Miralax. Colonoscopy neg.

## 2012-12-22 ENCOUNTER — Other Ambulatory Visit: Payer: Self-pay

## 2012-12-22 MED ORDER — DESLORATADINE-PSEUDOEPHED ER 2.5-120 MG PO TB12: 1.0000 | ORAL_TABLET | Freq: Two times a day (BID) | ORAL | Status: DC

## 2012-12-22 NOTE — Telephone Encounter (Signed)
Pt left v/m requesting refill clarinex D to meds by mail.Please advise.

## 2012-12-25 NOTE — Telephone Encounter (Signed)
Pt left v/m for status of refill for clarinex D to meds by mail.Apologized to pt and advised pt sent electronically on 12/22/12. Pt will ck with pharmacy.

## 2013-03-27 ENCOUNTER — Encounter: Payer: Self-pay | Admitting: Family Medicine

## 2013-06-01 ENCOUNTER — Telehealth: Payer: Self-pay

## 2013-06-01 NOTE — Telephone Encounter (Signed)
Pt will ck with pharmacy re; refills and if refills needed will have mail order pharmacy contact our office.

## 2013-06-01 NOTE — Telephone Encounter (Signed)
Pt left v/m requesting all meds be refilled to mail order pharmacy.pt did not leave name of meds or pharmacy name. Left v/m for pt to cb.Pt should contact pharmacy for refills and if no refills pharmacy will contact our office.

## 2013-06-02 ENCOUNTER — Other Ambulatory Visit: Payer: Self-pay

## 2013-06-02 ENCOUNTER — Ambulatory Visit (INDEPENDENT_AMBULATORY_CARE_PROVIDER_SITE_OTHER)

## 2013-06-02 DIAGNOSIS — Z23 Encounter for immunization: Secondary | ICD-10-CM

## 2013-06-02 MED ORDER — DESLORATADINE-PSEUDOEPHED ER 2.5-120 MG PO TB12: 1.0000 | ORAL_TABLET | Freq: Two times a day (BID) | ORAL | Status: DC

## 2013-06-02 NOTE — Telephone Encounter (Signed)
Pt request refill clarinex D to [facility]. Please advise.

## 2013-06-03 NOTE — Telephone Encounter (Signed)
Left message for pt to cb; to notify med was sent to pharmacy.

## 2013-06-03 NOTE — Telephone Encounter (Signed)
Pt notified rx went to [facility].

## 2013-08-17 ENCOUNTER — Other Ambulatory Visit

## 2013-08-17 ENCOUNTER — Other Ambulatory Visit: Payer: Self-pay | Admitting: Internal Medicine

## 2013-08-17 DIAGNOSIS — Z0000 Encounter for general adult medical examination without abnormal findings: Secondary | ICD-10-CM

## 2013-08-18 ENCOUNTER — Other Ambulatory Visit (INDEPENDENT_AMBULATORY_CARE_PROVIDER_SITE_OTHER)

## 2013-08-18 DIAGNOSIS — Z0000 Encounter for general adult medical examination without abnormal findings: Secondary | ICD-10-CM

## 2013-08-18 DIAGNOSIS — E785 Hyperlipidemia, unspecified: Secondary | ICD-10-CM

## 2013-08-18 DIAGNOSIS — E559 Vitamin D deficiency, unspecified: Secondary | ICD-10-CM

## 2013-08-18 LAB — LDL CHOLESTEROL, DIRECT: Direct LDL: 158.6 mg/dL

## 2013-08-18 LAB — LIPID PANEL
Cholesterol: 250 mg/dL — ABNORMAL HIGH (ref 0–200)
HDL: 79.2 mg/dL (ref >39.00)
Triglycerides: 38 mg/dL (ref 0.0–149.0)
VLDL: 7.6 mg/dL (ref 0.0–40.0)

## 2013-08-18 LAB — COMPREHENSIVE METABOLIC PANEL
ALT: 28 U/L (ref 0–35)
BUN: 14 mg/dL (ref 6–23)
CO2: 29 mEq/L (ref 19–32)
Calcium: 9 mg/dL (ref 8.4–10.5)
Chloride: 106 mEq/L (ref 96–112)
Creatinine, Ser: 0.7 mg/dL (ref 0.4–1.2)
GFR: 107.13 mL/min (ref >60.00)
Glucose, Bld: 93 mg/dL (ref 70–99)

## 2013-08-18 LAB — CBC
Hemoglobin: 13.4 g/dL (ref 12.0–15.0)
MCHC: 33.5 g/dL (ref 30.0–36.0)
Platelets: 235 10*3/uL (ref 150.0–400.0)
RBC: 4.65 Mil/uL (ref 3.87–5.11)

## 2013-09-01 ENCOUNTER — Encounter: Payer: Self-pay | Admitting: Family Medicine

## 2013-09-01 ENCOUNTER — Ambulatory Visit (INDEPENDENT_AMBULATORY_CARE_PROVIDER_SITE_OTHER): Admitting: Family Medicine

## 2013-09-01 ENCOUNTER — Other Ambulatory Visit (HOSPITAL_COMMUNITY)
Admission: RE | Admit: 2013-09-01 | Discharge: 2013-09-01 | Disposition: A | Discharge status: Home / Self Care | Source: Ambulatory Visit | Attending: Family Medicine | Admitting: Family Medicine

## 2013-09-01 VITALS — BP 130/88 | HR 88 | Temp 98.0°F | Ht 65.5 in | Wt 191.2 lb

## 2013-09-01 DIAGNOSIS — E785 Hyperlipidemia, unspecified: Secondary | ICD-10-CM

## 2013-09-01 DIAGNOSIS — Z0000 Encounter for general adult medical examination without abnormal findings: Secondary | ICD-10-CM

## 2013-09-01 DIAGNOSIS — Z1151 Encounter for screening for human papillomavirus (HPV): Secondary | ICD-10-CM | POA: Insufficient documentation

## 2013-09-01 DIAGNOSIS — K5900 Constipation, unspecified: Secondary | ICD-10-CM

## 2013-09-01 DIAGNOSIS — Z01419 Encounter for gynecological examination (general) (routine) without abnormal findings: Secondary | ICD-10-CM | POA: Insufficient documentation

## 2013-09-01 DIAGNOSIS — G5600 Carpal tunnel syndrome, unspecified upper limb: Secondary | ICD-10-CM

## 2013-09-01 NOTE — Assessment & Plan Note (Signed)
S/p surgical release. In PT.

## 2013-09-01 NOTE — Progress Notes (Signed)
58 yo pleasant female here for CPX.  Mammogram 03/2013 Colonoscopy 07/2012  HLD- deteriorated.  Has been eating a lot of nuts lately. Lab Results  Component Value Date   CHOL 250* 08/18/2013   HDL 79.20 08/18/2013   LDLCALC 115* 02/04/2007   LDLDIRECT 158.6 08/18/2013   TRIG 38.0 08/18/2013   CHOLHDL 3 08/18/2013    She is very physically active.  Did have carpal tunnel and trigger finger release (right) a few weeks ago.  Now in PT.  Has to have other hand done soon as well.  S/p complete hysterectomy for fibroid tumors. On vivelle-DOT patch for hot flashes, vaginal dryness.  She feels this is controlling her symptoms well. Denies any post menopausal bleeding.   Continues miralax every other day for constipation which seems to be working well.  Patient Active Problem List   Diagnosis Date Noted  . Constipation 05/28/2012  . Routine general medical examination at a health care facility 07/16/2011  . UNSPECIFIED VITAMIN D DEFICIENCY 07/03/2010  . POSTMENOPAUSAL STATUS 06/30/2009  . ALLERGIC RHINITIS 03/01/2009  . FIBROIDS, UTERUS 12/23/2006   Past Medical History  Diagnosis Date  . Allergic rhinitis   . Vitamin D deficiency    Past Surgical History  Procedure Laterality Date  . Abdominal hysterectomy     History  Substance Use Topics  . Smoking status: Former Smoker    Types: Cigarettes  . Smokeless tobacco: Never Used  . Alcohol Use: No   Family History  Problem Relation Age of Onset  . Colon cancer Sister   . Colon polyps Sister   . Diabetes Mother     father and 9 siblings  . Heart disease Father     paternal grandmother  . Kidney disease Mother     renal failure   No Known Allergies Current Outpatient Prescriptions on File Prior to Visit  Medication Sig Dispense Refill  . desloratadine-pseudoephedrine (CLARINEX-D 12-HOUR) 2.5-120 MG per tablet Take 1 tablet by mouth 2 (two) times daily.  180 tablet  0  . diclofenac (VOLTAREN) 75 MG EC tablet Take 1  tablet (75 mg total) by mouth 2 (two) times daily.  180 tablet  3  . estradiol (VIVELLE-DOT) 0.025 MG/24HR Place 1 patch onto the skin 2 (two) times a week.  24 patch  6  . fluticasone (FLONASE) 50 MCG/ACT nasal spray Place 1 spray into the nose daily as needed.  48 g  6   No current facility-administered medications on file prior to visit.   The PMH, PSH, Social History, Family History, Medications, and allergies have been reviewed in [facility], and have been updated if relevant.    Review of Systems  See HPI  Patient reports no  vision/ hearing changes,anorexia, weight change, fever ,adenopathy, persistant / recurrent hoarseness, swallowing issues, chest pain, edema,persistant / recurrent cough, hemoptysis, dyspnea(rest, exertional, paroxysmal nocturnal), gastrointestinal  bleeding (melena, rectal bleeding), abdominal pain, excessive heart burn, GU symptoms(dysuria, hematuria, pyuria, voiding/incontinence  Issues) syncope, focal weakness, severe memory loss, concerning skin lesions, depression, anxiety, abnormal bruising/bleeding, major joint swelling, breast masses or abnormal vaginal bleeding.    Physical Exam  BP 130/88  Pulse 88  Temp(Src) 98 F (36.7 C) (Oral)  Ht 5' 5.5" (1.664 m)  Wt 191 lb 4 oz (86.75 kg)  BMI 31.33 kg/m2  SpO2 97%  General:  Well-developed,well-nourished,in no acute distress; alert,appropriate and cooperative throughout examination Head:  normocephalic and atraumatic.   Eyes:  vision grossly intact, pupils equal, pupils round, and pupils reactive  to light.   Ears:  R ear normal and L ear normal.   Nose:  no external deformity.   Mouth:  good dentition.   Neck:  No deformities, masses, or tenderness noted. Breasts:  No mass, nodules, thickening, tenderness, bulging, retraction, inflamation, nipple discharge or skin changes noted.   Lungs:  Normal respiratory effort, chest expands symmetrically. Lungs are clear to auscultation, no crackles or wheezes. Heart:   Normal rate and regular rhythm. S1 and S2 normal without gallop, murmur, click, rub or other extra sounds. Abdomen:  Bowel sounds positive,abdomen soft and non-tender without masses, organomegaly or hernias noted. Rectal:  no external abnormalities.   Genitalia:  Pelvic Exam:        External: normal female genitalia without lesions or masses        Vagina: normal without lesions or masses        Cervix: absent        Adnexa: absent        Uterus: absent       Msk:  No deformity or scoliosis noted of thoracic or lumbar spine.   Extremities:  No clubbing, cyanosis, edema, or deformity noted with normal full range of motion of all joints.   Well healed incision right wrist, base of thumb Neurologic:  alert & oriented X3 and gait normal.   Skin:  Intact without suspicious lesions or rashes Cervical Nodes:  No lymphadenopathy noted Axillary Nodes:  No palpable lymphadenopathy Psych:  Cognition and judgment appear intact. Alert and cooperative with normal attention span and concentration. No apparent delusions, illusions, hallucinations    Assessment and Plan:

## 2013-09-01 NOTE — Assessment & Plan Note (Addendum)
Reviewed preventive care protocols, scheduled due services, and updated immunizations Discussed nutrition, exercise, diet, and healthy lifestyle.  Discussed USPSTF recommendations of cervical cancer screening.  She is aware that she does not need a pap smear since she has had a hysterectomy and interval of 3 years is recommended for pelvic exam but pt would prefer to have pap smear done today.

## 2013-09-01 NOTE — Assessment & Plan Note (Signed)
Continue Miralax every other day.

## 2013-09-01 NOTE — Patient Instructions (Addendum)
Great to see you. Check with your insurance to see if they will cover the shingles shot.  Please cut back on nuts.  Schedule a lab appointment in a few months to recheck your cholesterol.

## 2013-09-01 NOTE — Assessment & Plan Note (Signed)
Deteriorated. Cut back on nuts.  Recheck lipid panel in 3 months.

## 2013-09-01 NOTE — Progress Notes (Signed)
Pre-visit discussion using our clinic review tool. No additional management support is needed unless otherwise documented below in the visit note.  

## 2013-09-04 ENCOUNTER — Encounter: Payer: Self-pay | Admitting: *Deleted

## 2013-09-22 ENCOUNTER — Telehealth: Payer: Self-pay | Admitting: *Deleted

## 2013-09-22 NOTE — Telephone Encounter (Signed)
Patient called stating that she needs a refill on her medications sent to [facility]. Medications needed is Clarinex D, Flonase and Estradiol.

## 2013-09-23 MED ORDER — ESTRADIOL 0.025 MG/24HR TD PTTW: 1.0000 | MEDICATED_PATCH | TRANSDERMAL | Status: DC

## 2013-09-23 MED ORDER — DESLORATADINE-PSEUDOEPHED ER 2.5-120 MG PO TB12: 1.0000 | ORAL_TABLET | Freq: Two times a day (BID) | ORAL | Status: DC

## 2013-09-23 MED ORDER — FLUTICASONE PROPIONATE 50 MCG/ACT NA SUSP: 1.0000 | Freq: Every day | NASAL | Status: DC | PRN

## 2013-09-23 NOTE — Telephone Encounter (Signed)
Lm on pts vm informing her Rx's have been sent to requested pharmacy

## 2013-11-10 ENCOUNTER — Ambulatory Visit (INDEPENDENT_AMBULATORY_CARE_PROVIDER_SITE_OTHER): Admitting: *Deleted

## 2013-11-10 DIAGNOSIS — Z2911 Encounter for prophylactic immunotherapy for respiratory syncytial virus (RSV): Secondary | ICD-10-CM

## 2013-11-10 DIAGNOSIS — Z23 Encounter for immunization: Secondary | ICD-10-CM

## 2013-11-10 NOTE — Progress Notes (Signed)
Patient ID: Jillian [name], female   DOB: 11/20/1955, 58 y.o.   MRN: [id number] Pt states she verified with her insurance company twice, that the shingles vaccine is fully covered to have in the office.

## 2014-01-19 ENCOUNTER — Ambulatory Visit (INDEPENDENT_AMBULATORY_CARE_PROVIDER_SITE_OTHER): Admitting: Internal Medicine

## 2014-01-19 ENCOUNTER — Ambulatory Visit: Admitting: Internal Medicine

## 2014-01-19 ENCOUNTER — Encounter: Payer: Self-pay | Admitting: Internal Medicine

## 2014-01-19 VITALS — BP 120/76 | HR 100 | Temp 97.8°F | Wt 198.8 lb

## 2014-01-19 DIAGNOSIS — K648 Other hemorrhoids: Secondary | ICD-10-CM

## 2014-01-19 DIAGNOSIS — K6289 Other specified diseases of anus and rectum: Secondary | ICD-10-CM

## 2014-01-19 MED ORDER — HYDROCORTISONE ACETATE 25 MG RE SUPP: 25.0000 mg | Freq: Two times a day (BID) | RECTAL | Status: DC

## 2014-01-19 NOTE — Progress Notes (Signed)
Subjective:    Patient ID: Jillian [name], female    DOB: 1956/05/21, 58 y.o.   MRN: [id number]  HPI  Pt presents to the clinic today with c/o hemorrhoids. This started 1 week ago. She has not been constipated. She denies pain with BM. She has not noticed any blood with wiping. She had tried Sitz baths with no relief. She has also tried OTC suppositories without relief. She did have a colonoscopy by Dr. Fuller Plan in 2013 which did show small internal hemorrhoids.  Review of Systems      Past Medical History  Diagnosis Date  . Allergic rhinitis   . Vitamin D deficiency     Current Outpatient Prescriptions  Medication Sig Dispense Refill  . desloratadine-pseudoephedrine (CLARINEX-D 12-HOUR) 2.5-120 MG per tablet Take 1 tablet by mouth 2 (two) times daily.  180 tablet  0  . diclofenac (VOLTAREN) 75 MG EC tablet Take 1 tablet (75 mg total) by mouth 2 (two) times daily.  180 tablet  3  . estradiol (VIVELLE-DOT) 0.025 MG/24HR Place 1 patch onto the skin 2 (two) times a week.  24 patch  6  . fluticasone (FLONASE) 50 MCG/ACT nasal spray Place 1 spray into both nostrils daily as needed.  48 g  6  . methocarbamol (ROBAXIN) 500 MG tablet Take 500 mg by mouth 4 (four) times daily.       No current facility-administered medications for this visit.    No Known Allergies  Family History  Problem Relation Age of Onset  . Colon cancer Sister   . Colon polyps Sister   . Diabetes Mother     father and 9 siblings  . Heart disease Father     paternal grandmother  . Kidney disease Mother     renal failure    History   Social History  . Marital Status: Married    Spouse Name: N/A    Number of Children: N/A  . Years of Education: N/A   Occupational History  . Not on file.   Social History Main Topics  . Smoking status: Former Smoker    Types: Cigarettes  . Smokeless tobacco: Never Used     Comment: 30 years  . Alcohol Use: No  . Drug Use: No  . Sexual Activity: Not on file    Other Topics Concern  . Not on file   Social History Narrative  . No narrative on file     Constitutional: Denies fever, malaise, fatigue, headache or abrupt weight changes.  Gastrointestinal: Pt reports rectal pain and pressure. Denies abdominal pain, bloating, constipation, diarrhea or blood in the stool.   No other specific complaints in a complete review of systems (except as listed in HPI above).  Objective:   Physical Exam    BP 120/76  Pulse 100  Temp(Src) 97.8 F (36.6 C) (Oral)  Wt 198 lb 12 oz (90.152 kg)  SpO2 98% Wt Readings from Last 3 Encounters:  01/19/14 198 lb 12 oz (90.152 kg)  09/01/13 191 lb 4 oz (86.75 kg)  07/28/12 180 lb (81.647 kg)    General: Appears her stated age, well developed, well nourished in NAD. Cardiovascular: Normal rate and rhythm. S1,S2 noted.  No murmur, rubs or gallops noted. No JVD or BLE edema. No carotid bruits noted. Pulmonary/Chest: Normal effort and positive vesicular breath sounds. No respiratory distress. No wheezes, rales or ronchi noted.  Abdomen: Soft and nontender. Normal bowel sounds, no bruits noted. No distention or masses noted. Liver,  spleen and kidneys non palpable. DRE: Small external hemorrhoid, non thrombosed. Rectal tone normal. I did not feel any evidence of mass or hemorrhoid inside the rectal vault.   BMET    Component Value Date/Time   NA 139 08/18/2013 0803   K 4.3 08/18/2013 0803   CL 106 08/18/2013 0803   CO2 29 08/18/2013 0803   GLUCOSE 93 08/18/2013 0803   BUN 14 08/18/2013 0803   CREATININE 0.7 08/18/2013 0803   CALCIUM 9.0 08/18/2013 0803   GFRNONAA 108.35 07/03/2010 0911   GFRAA 135 02/10/2008 0956    Lipid Panel     Component Value Date/Time   CHOL 250* 08/18/2013 0803   TRIG 38.0 08/18/2013 0803   HDL 79.20 08/18/2013 0803   CHOLHDL 3 08/18/2013 0803   VLDL 7.6 08/18/2013 0803   LDLCALC 115* 02/04/2007 1203    CBC    Component Value Date/Time   WBC 4.9 08/18/2013 0803   RBC  4.65 08/18/2013 0803   HGB 13.4 08/18/2013 0803   HCT 40.0 08/18/2013 0803   PLT 235.0 08/18/2013 0803   MCV 85.9 08/18/2013 0803   MCHC 33.5 08/18/2013 0803   RDW 14.5 08/18/2013 0803   MONOABS 0.3 02/10/2008 0956   EOSABS 0.1 02/10/2008 0956   BASOSABS 0.0 02/10/2008 0956    Hgb A1C No results found for this basename: HGBA1C       Assessment & Plan:   Rectal pain and pressure:  She is concerned that it is her internal hemorrhoids eRx for Anusol supppositories If no improvement, follow up with PCP or GI  RTC as needed or if symptoms persist or worsen

## 2014-01-19 NOTE — Patient Instructions (Addendum)
Hemorrhoids Hemorrhoids are swollen veins around the rectum or anus. There are two types of hemorrhoids:   Internal hemorrhoids. These occur in the veins just inside the rectum. They may poke through to the outside and become irritated and painful.  External hemorrhoids. These occur in the veins outside the anus and can be felt as a painful swelling or hard lump near the anus. CAUSES  Pregnancy.   Obesity.   Constipation or diarrhea.   Straining to have a bowel movement.   Sitting for long periods on the toilet.  Heavy lifting or other activity that caused you to strain.  Anal intercourse. SYMPTOMS   Pain.   Anal itching or irritation.   Rectal bleeding.   Fecal leakage.   Anal swelling.   One or more lumps around the anus.  DIAGNOSIS  Your caregiver may be able to diagnose hemorrhoids by visual examination. Other examinations or tests that may be performed include:   Examination of the rectal area with a gloved hand (digital rectal exam).   Examination of anal canal using a small tube (scope).   A blood test if you have lost a significant amount of blood.  A test to look inside the colon (sigmoidoscopy or colonoscopy). TREATMENT Most hemorrhoids can be treated at home. However, if symptoms do not seem to be getting better or if you have a lot of rectal bleeding, your caregiver may perform a procedure to help make the hemorrhoids get smaller or remove them completely. Possible treatments include:   Placing a rubber band at the base of the hemorrhoid to cut off the circulation (rubber band ligation).   Injecting a chemical to shrink the hemorrhoid (sclerotherapy).   Using a tool to burn the hemorrhoid (infrared light therapy).   Surgically removing the hemorrhoid (hemorrhoidectomy).   Stapling the hemorrhoid to block blood flow to the tissue (hemorrhoid stapling).  HOME CARE INSTRUCTIONS   Eat foods with fiber, such as whole grains, beans,  nuts, fruits, and vegetables. Ask your doctor about taking products with added fiber in them (fibersupplements).  Increase fluid intake. Drink enough water and fluids to keep your urine clear or pale yellow.   Exercise regularly.   Go to the bathroom when you have the urge to have a bowel movement. Do not wait.   Avoid straining to have bowel movements.   Keep the anal area dry and clean. Use wet toilet paper or moist towelettes after a bowel movement.   Medicated creams and suppositories may be used or applied as directed.   Only take over-the-counter or prescription medicines as directed by your caregiver.   Take warm sitz baths for 15 20 minutes, 3 4 times a day to ease pain and discomfort.   Place ice packs on the hemorrhoids if they are tender and swollen. Using ice packs between sitz baths may be helpful.   Put ice in a plastic bag.   Place a towel between your skin and the bag.   Leave the ice on for 15 20 minutes, 3 4 times a day.   Do not use a donut-shaped pillow or sit on the toilet for long periods. This increases blood pooling and pain.  SEEK MEDICAL CARE IF:  You have increasing pain and swelling that is not controlled by treatment or medicine.  You have uncontrolled bleeding.  You have difficulty or you are unable to have a bowel movement.  You have pain or inflammation outside the area of the hemorrhoids. MAKE SURE YOU:    Understand these instructions.  Will watch your condition.  Will get help right away if you are not doing well or get worse. Document Released: 08/03/2000 Document Revised: 07/23/2012 Document Reviewed: 06/10/2012 [employer] Patient Information 2014 [employer].  

## 2014-01-19 NOTE — Progress Notes (Signed)
Pre visit review using our clinic review tool, if applicable. No additional management support is needed unless otherwise documented below in the visit note. 

## 2014-04-16 ENCOUNTER — Encounter: Payer: Self-pay | Admitting: Family Medicine

## 2014-05-26 ENCOUNTER — Ambulatory Visit (INDEPENDENT_AMBULATORY_CARE_PROVIDER_SITE_OTHER)

## 2014-05-26 DIAGNOSIS — Z23 Encounter for immunization: Secondary | ICD-10-CM

## 2014-08-24 ENCOUNTER — Other Ambulatory Visit: Payer: Self-pay | Admitting: Family Medicine

## 2014-08-24 MED ORDER — ESTRADIOL 0.025 MG/24HR TD PTTW: 1.0000 | MEDICATED_PATCH | TRANSDERMAL | Status: DC

## 2014-08-24 MED ORDER — DESLORATADINE-PSEUDOEPHED ER 2.5-120 MG PO TB12: 1.0000 | ORAL_TABLET | Freq: Two times a day (BID) | ORAL | Status: DC

## 2014-08-24 MED ORDER — FLUTICASONE PROPIONATE 50 MCG/ACT NA SUSP: 1.0000 | Freq: Every day | NASAL | Status: DC | PRN

## 2014-08-24 NOTE — Telephone Encounter (Signed)
Pt has CPE scheduled for next week, ok to fill x 3 mos. For mail order?

## 2014-08-25 ENCOUNTER — Other Ambulatory Visit: Payer: Self-pay | Admitting: Family Medicine

## 2014-08-25 DIAGNOSIS — Z0000 Encounter for general adult medical examination without abnormal findings: Secondary | ICD-10-CM

## 2014-08-30 ENCOUNTER — Other Ambulatory Visit (INDEPENDENT_AMBULATORY_CARE_PROVIDER_SITE_OTHER)

## 2014-08-30 DIAGNOSIS — Z0000 Encounter for general adult medical examination without abnormal findings: Secondary | ICD-10-CM

## 2014-08-30 LAB — LIPID PANEL
CHOL/HDL RATIO: 3
Cholesterol: 227 mg/dL — ABNORMAL HIGH (ref 0–200)
HDL: 81.6 mg/dL (ref >39.00)
LDL CALC: 136 mg/dL — AB (ref 0–99)
NonHDL: 145.4
Triglycerides: 46 mg/dL (ref 0.0–149.0)
VLDL: 9.2 mg/dL (ref 0.0–40.0)

## 2014-08-30 LAB — COMPREHENSIVE METABOLIC PANEL
ALK PHOS: 56 U/L (ref 39–117)
ALT: 47 U/L — ABNORMAL HIGH (ref 0–35)
AST: 37 U/L (ref 0–37)
Albumin: 4 g/dL (ref 3.5–5.2)
BILIRUBIN TOTAL: 0.4 mg/dL (ref 0.2–1.2)
BUN: 11 mg/dL (ref 6–23)
CO2: 25 meq/L (ref 19–32)
CREATININE: 0.7 mg/dL (ref 0.4–1.2)
Calcium: 8.7 mg/dL (ref 8.4–10.5)
Chloride: 108 mEq/L (ref 96–112)
GFR: 114.02 mL/min (ref >60.00)
GLUCOSE: 92 mg/dL (ref 70–99)
POTASSIUM: 4.4 meq/L (ref 3.5–5.1)
Sodium: 140 mEq/L (ref 135–145)
Total Protein: 7.6 g/dL (ref 6.0–8.3)

## 2014-08-30 LAB — CBC WITH DIFFERENTIAL/PLATELET
BASOS PCT: 0.7 % (ref 0.0–3.0)
Basophils Absolute: 0 10*3/uL (ref 0.0–0.1)
Eosinophils Absolute: 0.2 10*3/uL (ref 0.0–0.7)
Eosinophils Relative: 4.7 % (ref 0.0–5.0)
HEMATOCRIT: 42.6 % (ref 36.0–46.0)
Hemoglobin: 13.8 g/dL (ref 12.0–15.0)
Lymphocytes Relative: 40.1 % (ref 12.0–46.0)
Lymphs Abs: 1.7 10*3/uL (ref 0.7–4.0)
MCHC: 32.5 g/dL (ref 30.0–36.0)
MCV: 86.7 fl (ref 78.0–100.0)
Monocytes Absolute: 0.5 10*3/uL (ref 0.1–1.0)
Monocytes Relative: 11.3 % (ref 3.0–12.0)
NEUTROS ABS: 1.8 10*3/uL (ref 1.4–7.7)
Neutrophils Relative %: 43.2 % (ref 43.0–77.0)
Platelets: 254 10*3/uL (ref 150.0–400.0)
RBC: 4.91 Mil/uL (ref 3.87–5.11)
RDW: 14.3 % (ref 11.5–15.5)
WBC: 4.2 10*3/uL (ref 4.0–10.5)

## 2014-08-30 LAB — TSH: TSH: 2.09 u[IU]/mL (ref 0.35–4.50)

## 2014-09-08 ENCOUNTER — Ambulatory Visit (INDEPENDENT_AMBULATORY_CARE_PROVIDER_SITE_OTHER): Admitting: Family Medicine

## 2014-09-08 ENCOUNTER — Encounter: Payer: Self-pay | Admitting: Family Medicine

## 2014-09-08 VITALS — BP 122/78 | HR 77 | Temp 97.8°F | Ht 65.5 in | Wt 194.5 lb

## 2014-09-08 DIAGNOSIS — E785 Hyperlipidemia, unspecified: Secondary | ICD-10-CM

## 2014-09-08 DIAGNOSIS — L918 Other hypertrophic disorders of the skin: Secondary | ICD-10-CM

## 2014-09-08 DIAGNOSIS — Z0000 Encounter for general adult medical examination without abnormal findings: Secondary | ICD-10-CM

## 2014-09-08 DIAGNOSIS — Z87891 Personal history of nicotine dependence: Secondary | ICD-10-CM | POA: Insufficient documentation

## 2014-09-08 DIAGNOSIS — Z136 Encounter for screening for cardiovascular disorders: Secondary | ICD-10-CM

## 2014-09-08 NOTE — Assessment & Plan Note (Signed)
Improved with diet  

## 2014-09-08 NOTE — Progress Notes (Signed)
59 yo pleasant female here for CPX.  Has been having more issues with her shoulder and carpal tunnel-seeing Dr. Marlou Sa.  Just had a subacromial injection (right) this am.  Mammogram 04/16/14 Colonoscopy 07/24/2012  Skin tag on the back of her neck- starting to bother her when it gets caught in her necklace and clothing. HLD- improved.  Lab Results  Component Value Date   CHOL 227* 08/30/2014   HDL 81.60 08/30/2014   LDLCALC 136* 08/30/2014   LDLDIRECT 158.6 08/18/2013   TRIG 46.0 08/30/2014   CHOLHDL 3 08/30/2014   Lab Results  Component Value Date   WBC 4.2 08/30/2014   HGB 13.8 08/30/2014   HCT 42.6 08/30/2014   MCV 86.7 08/30/2014   PLT 254.0 08/30/2014   Lab Results  Component Value Date   CREATININE 0.7 08/30/2014   Lab Results  Component Value Date   TSH 2.09 08/30/2014    She is very physically active. Wt Readings from Last 3 Encounters:  09/08/14 194 lb 8 oz (88.225 kg)  01/19/14 198 lb 12 oz (90.152 kg)  09/01/13 191 lb 4 oz (86.75 kg)   l.  S/p complete hysterectomy for fibroid tumors. On vivelle-DOT patch for hot flashes, vaginal dryness.  She feels this is controlling her symptoms well. Denies any post menopausal bleeding.    Patient Active Problem List   Diagnosis Date Noted  . Skin tag 09/08/2014  . HLD (hyperlipidemia) 09/01/2013  . Carpal tunnel syndrome 09/01/2013  . Constipation 05/28/2012  . Routine general medical examination at a health care facility 07/16/2011  . UNSPECIFIED VITAMIN D DEFICIENCY 07/03/2010  . POSTMENOPAUSAL STATUS 06/30/2009  . ALLERGIC RHINITIS 03/01/2009  . FIBROIDS, UTERUS 12/23/2006   Past Medical History  Diagnosis Date  . Allergic rhinitis   . Vitamin D deficiency    Past Surgical History  Procedure Laterality Date  . Abdominal hysterectomy     History  Substance Use Topics  . Smoking status: Former Smoker    Types: Cigarettes  . Smokeless tobacco: Never Used     Comment: 30 years  . Alcohol Use: No    Family History  Problem Relation Age of Onset  . Colon cancer Sister   . Colon polyps Sister   . Diabetes Mother     father and 9 siblings  . Heart disease Father     paternal grandmother  . Kidney disease Mother     renal failure   No Known Allergies Current Outpatient Prescriptions on File Prior to Visit  Medication Sig Dispense Refill  . desloratadine-pseudoephedrine (CLARINEX-D 12-HOUR) 2.5-120 MG per tablet Take 1 tablet by mouth 2 (two) times daily. 180 tablet 0  . diclofenac (VOLTAREN) 75 MG EC tablet Take 1 tablet (75 mg total) by mouth 2 (two) times daily. 180 tablet 3  . estradiol (VIVELLE-DOT) 0.025 MG/24HR Place 1 patch onto the skin 2 (two) times a week. 24 patch 0  . fluticasone (FLONASE) 50 MCG/ACT nasal spray Place 1 spray into both nostrils daily as needed. 48 g 2  . hydrocortisone (ANUSOL-HC) 25 MG suppository Place 1 suppository (25 mg total) rectally 2 (two) times daily. 12 suppository 0  . methocarbamol (ROBAXIN) 500 MG tablet Take 500 mg by mouth 4 (four) times daily.     No current facility-administered medications on file prior to visit.   The PMH, PSH, Social History, Family History, Medications, and allergies have been reviewed in [facility], and have been updated if relevant.    Review of Systems  See HPI  Denies changes in bowel or bladder habits Denies HA No blood in her stool No dysuria or increased urinary frequency Denies CP or SOB No LE edema No blurred vision Denies feeling depressed or anxious Appetite good- she has cut back on nuts and other foods higher in cholesterol Wt Readings from Last 3 Encounters:  09/08/14 194 lb 8 oz (88.225 kg)  01/19/14 198 lb 12 oz (90.152 kg)  09/01/13 191 lb 4 oz (86.75 kg)    Physical Exam  BP 122/78 mmHg  Pulse 77  Temp(Src) 97.8 F (36.6 C) (Oral)  Ht 5' 5.5" (1.664 m)  Wt 194 lb 8 oz (88.225 kg)  BMI 31.86 kg/m2  SpO2 99%  General:  Well-developed,well-nourished,in no acute distress;  alert,appropriate and cooperative throughout examination Head:  normocephalic and atraumatic.   Eyes:  vision grossly intact, pupils equal, pupils round, and pupils reactive to light.   Ears:  R ear normal and L ear normal.   Nose:  no external deformity.   Mouth:  good dentition.   Neck:  No deformities, masses, or tenderness noted. Breasts:  No mass, nodules, thickening, tenderness, bulging, retraction, inflamation, nipple discharge or skin changes noted.   Lungs:  Normal respiratory effort, chest expands symmetrically. Lungs are clear to auscultation, no crackles or wheezes. Heart:  Normal rate and regular rhythm. S1 and S2 normal without gallop, murmur, click, rub or other extra sounds. Abdomen:  Bowel sounds positive,abdomen soft and non-tender without masses, organomegaly or hernias noted. Rectal:  no external abnormalities.     Msk:  No deformity or scoliosis noted of thoracic or lumbar spine.   Extremities:  No clubbing, cyanosis, edema, or deformity noted with normal full range of motion of all joints.   Neurologic:  alert & oriented X3 and gait normal.   Skin:  benign appearing skin tag noted on back of the neck Cervical Nodes:  No lymphadenopathy noted Axillary Nodes:  No palpable lymphadenopathy Psych:  Cognition and judgment appear intact. Alert and cooperative with normal attention span and concentration. No apparent delusions, illusions, hallucinations

## 2014-09-08 NOTE — Progress Notes (Signed)
Pre visit review using our clinic review tool, if applicable. No additional management support is needed unless otherwise documented below in the visit note. 

## 2014-09-08 NOTE — Assessment & Plan Note (Signed)
Reviewed preventive care protocols, scheduled due services, and updated immunizations Discussed nutrition, exercise, diet, and healthy lifestyle.  Influenza vaccine UTD.

## 2014-09-08 NOTE — Assessment & Plan Note (Signed)
Skin tag is snipped off using Betadine for cleansing and sterile iris scissors. Local anesthesia was used. Sent for pathology.

## 2014-09-09 ENCOUNTER — Other Ambulatory Visit: Payer: Self-pay | Admitting: Internal Medicine

## 2014-09-09 DIAGNOSIS — I714 Abdominal aortic aneurysm, without rupture, unspecified: Secondary | ICD-10-CM

## 2014-09-27 ENCOUNTER — Telehealth: Payer: Self-pay | Admitting: Family Medicine

## 2014-09-27 NOTE — Telephone Encounter (Signed)
What??  No, I do want her to have AAA repaired. Please call pt and [facility] to tell them.

## 2014-09-27 NOTE — Telephone Encounter (Signed)
Dr. Deborra Medina, [facility] in [place] called. Pt was scheduled for AAA procedure for 10/01/13 but called and cancelled b/c she was told by you that this procedure was not needed. [facility] wanted to make sure this was correct and to verify? Please advise

## 2014-09-27 NOTE — Telephone Encounter (Signed)
Spoke to [facility] and advised that pts insurance will not cover procedure,and it was cancelled. This was not the actual surgery, but instead, an u/s. This was not a procedure requested by Dr Deborra Medina, but instead, the pt, because she recently had a friend to die, who was a former smoker, which the pt also was. She was a little nervous and wanted to have it completed as a precautionary measure.

## 2014-10-01 ENCOUNTER — Encounter

## 2014-10-05 NOTE — Addendum Note (Signed)
Addended by: Lurlean Nanny on: 10/05/2014 11:05 AM   Modules accepted: Orders

## 2014-10-29 ENCOUNTER — Other Ambulatory Visit: Payer: Self-pay | Admitting: Orthopedic Surgery

## 2014-10-29 DIAGNOSIS — M545 Low back pain: Secondary | ICD-10-CM

## 2014-11-07 ENCOUNTER — Other Ambulatory Visit

## 2014-11-21 ENCOUNTER — Ambulatory Visit
Admission: RE | Admit: 2014-11-21 | Discharge: 2014-11-21 | Disposition: A | Discharge status: Home / Self Care | Source: Ambulatory Visit | Attending: Orthopedic Surgery | Admitting: Orthopedic Surgery

## 2014-11-21 DIAGNOSIS — M545 Low back pain: Secondary | ICD-10-CM

## 2014-11-22 ENCOUNTER — Other Ambulatory Visit

## 2014-12-21 ENCOUNTER — Other Ambulatory Visit: Payer: Self-pay | Admitting: *Deleted

## 2014-12-21 MED ORDER — ESTRADIOL 0.025 MG/24HR TD PTTW: 1.0000 | MEDICATED_PATCH | TRANSDERMAL | Status: DC

## 2014-12-21 NOTE — Telephone Encounter (Signed)
Patient left a voicemail stating that when she was in for her annual exam she was told that her medications were refilled, but Vivelle dot was not sent in. Patient stated that she needs this sent in electronically to her mail order pharmacy. Patient stated that she is completely out. Patient wants a call back to let her know that this was done.  Refill sent to pharmacy electronically per patient's request. Patient notified by telephone.

## 2015-01-03 ENCOUNTER — Other Ambulatory Visit: Payer: Self-pay | Admitting: *Deleted

## 2015-01-03 MED ORDER — ESTRADIOL 0.025 MG/24HR TD PTTW: 1.0000 | MEDICATED_PATCH | TRANSDERMAL | Status: DC

## 2015-01-03 NOTE — Telephone Encounter (Signed)
Prescription refill was sent to mail order pharmacy but they are currently out of stock.  Patient asked for a 30 day supply to [facility] in [place] to last until she can get mail order.  Refill sent.

## 2015-01-27 ENCOUNTER — Other Ambulatory Visit: Payer: Self-pay

## 2015-01-27 NOTE — Telephone Encounter (Signed)
Pt left v/m requesting refill estradiol patch to [facility] [place]; meds by mail does not have estradiol patch in stock. Please advise. Last annual 09/08/2014 and rx last refilled # 8 patch on 01/03/2015.

## 2015-01-28 MED ORDER — ESTRADIOL 0.025 MG/24HR TD PTTW: 1.0000 | MEDICATED_PATCH | TRANSDERMAL | Status: DC

## 2015-03-07 ENCOUNTER — Other Ambulatory Visit: Payer: Self-pay

## 2015-03-07 MED ORDER — ESTRADIOL 0.025 MG/24HR TD PTTW: 1.0000 | MEDICATED_PATCH | TRANSDERMAL | Status: DC

## 2015-03-07 NOTE — Telephone Encounter (Signed)
Pt left v/m; [facility] by mail still out of estradiol; pt request refill estradiol to [facility] [place]; last annual exam 09/08/14; last mammo 04/19/14 and last pap 09/01/2013.Please advise.

## 2015-03-21 ENCOUNTER — Other Ambulatory Visit: Payer: Self-pay

## 2015-03-21 ENCOUNTER — Other Ambulatory Visit: Payer: Self-pay | Admitting: Family Medicine

## 2015-03-21 MED ORDER — DESLORATADINE-PSEUDOEPHED ER 2.5-120 MG PO TB12
1.0000 | ORAL_TABLET | Freq: Two times a day (BID) | ORAL | Status: DC
Start: 2015-03-21 — End: 2015-08-10

## 2015-03-21 MED ORDER — ESTRADIOL 0.025 MG/24HR TD PTTW: 1.0000 | MEDICATED_PATCH | TRANSDERMAL | Status: DC

## 2015-03-21 NOTE — Telephone Encounter (Signed)
Pt left v/m requesting refill pseudoephedrine to [facility] mail; last annual exam 09/08/14 and last refilled # 180 on 08/24/2014.Please advise.

## 2015-03-21 NOTE — Telephone Encounter (Signed)
Pt left v/m requesting 3 refill estradiol to [facility] [place]; pt last annual exam on 09/08/14 last mammo 04/19/14 and last pap 09/01/13.Please advise.

## 2015-05-17 ENCOUNTER — Encounter: Payer: Self-pay | Admitting: Family Medicine

## 2015-06-06 ENCOUNTER — Telehealth: Payer: Self-pay

## 2015-06-06 NOTE — Telephone Encounter (Signed)
Pt request refill estradiol to [facility] [place]. Left v/m requesting pt to cb. Last filled 05/21/15.

## 2015-06-06 NOTE — Telephone Encounter (Signed)
Pt still has few weeks of med left and will cb at end of month for refills.

## 2015-06-23 ENCOUNTER — Ambulatory Visit (INDEPENDENT_AMBULATORY_CARE_PROVIDER_SITE_OTHER)

## 2015-06-23 DIAGNOSIS — Z23 Encounter for immunization: Secondary | ICD-10-CM

## 2015-06-27 ENCOUNTER — Other Ambulatory Visit: Payer: Self-pay | Admitting: Family Medicine

## 2015-07-22 ENCOUNTER — Encounter: Payer: Self-pay | Admitting: Family Medicine

## 2015-07-22 ENCOUNTER — Ambulatory Visit (INDEPENDENT_AMBULATORY_CARE_PROVIDER_SITE_OTHER): Admitting: Family Medicine

## 2015-07-22 VITALS — BP 130/84 | HR 97 | Temp 98.6°F | Ht 65.5 in | Wt 195.5 lb

## 2015-07-22 DIAGNOSIS — H6980 Other specified disorders of Eustachian tube, unspecified ear: Secondary | ICD-10-CM | POA: Insufficient documentation

## 2015-07-22 DIAGNOSIS — J302 Other seasonal allergic rhinitis: Secondary | ICD-10-CM | POA: Diagnosis not present

## 2015-07-22 DIAGNOSIS — J040 Acute laryngitis: Secondary | ICD-10-CM | POA: Diagnosis not present

## 2015-07-22 DIAGNOSIS — H6983 Other specified disorders of Eustachian tube, bilateral: Secondary | ICD-10-CM

## 2015-07-22 MED ORDER — GUAIFENESIN-CODEINE 100-10 MG/5ML PO SYRP: 5.0000 mL | ORAL_SOLUTION | Freq: Every evening | ORAL | Status: DC | PRN

## 2015-07-22 NOTE — Assessment & Plan Note (Signed)
INncease flonase and continue mucinex and clarinex.

## 2015-07-22 NOTE — Patient Instructions (Signed)
Increase flonase to 2 spray daily.  Continue mucinex  Twice daily.  Continue clarinex D.  Can use cough supressant at night for cough.  Work on voice rest as able.  Call if not improving as expected.

## 2015-07-22 NOTE — Assessment & Plan Note (Signed)
Treat with nasal steroids.

## 2015-07-22 NOTE — Progress Notes (Signed)
   Subjective:    Patient ID: Jillian [name], female    DOB: 1955-08-22, 59 y.o.   MRN: [id number]  HPI    59 year old female pt of Dr. Hulen Shouts with history of  allergic rhinitis, tobacco use presents with new onset hoarseness.    She reports several days of post nasal drip, drainage, nasal congestion. No sore throat, feel something in throat. Cough just started after rushing here. No fever.  No ear pain, no sinus pain. Some ear congestion and fullness. No sneeze, no itchy eyes.   Using mucinex and cough syrup. USing flonase 1 spray per nostril daily.  She does work outside. She uses clarinex D year round for allergies.   Husband with cold and allergies. Social History /Family History/Past Medical History reviewed and updated if needed.   Review of Systems  Constitutional: Negative for fever and fatigue.  HENT: Negative for ear pain.   Eyes: Negative for pain.  Respiratory: Positive for cough. Negative for chest tightness and shortness of breath.   Cardiovascular: Negative for chest pain, palpitations and leg swelling.  Gastrointestinal: Negative for abdominal pain.  Genitourinary: Negative for dysuria.       Objective:   Physical Exam  Constitutional: Vital signs are normal. She appears well-developed and well-nourished. She is cooperative.  Non-toxic appearance. She does not appear ill. No distress.  Has lost voice, unable to speak except faintly  HENT:  Head: Normocephalic.  Right Ear: Hearing, external ear and ear canal normal. Tympanic membrane is not erythematous, not retracted and not bulging. A middle ear effusion is present.  Left Ear: Hearing, external ear and ear canal normal. Tympanic membrane is not erythematous, not retracted and not bulging. A middle ear effusion is present.  Nose: Mucosal edema and rhinorrhea present. Right sinus exhibits no maxillary sinus tenderness and no frontal sinus tenderness. Left sinus exhibits no maxillary sinus tenderness and no  frontal sinus tenderness.  Mouth/Throat: Uvula is midline and mucous membranes are normal. Posterior oropharyngeal erythema present. No oropharyngeal exudate or posterior oropharyngeal edema.  Eyes: Conjunctivae, EOM and lids are normal. Pupils are equal, round, and reactive to light. Lids are everted and swept, no foreign bodies found.  Neck: Trachea normal and normal range of motion. Neck supple. Carotid bruit is not present. No thyroid mass and no thyromegaly present.  Cardiovascular: Normal rate, regular rhythm, S1 normal, S2 normal, normal heart sounds, intact distal pulses and normal pulses.  Exam reveals no gallop and no friction rub.   No murmur heard. Pulmonary/Chest: Effort normal and breath sounds normal. No tachypnea. No respiratory distress. She has no decreased breath sounds. She has no wheezes. She has no rhonchi. She has no rales.  Neurological: She is alert.  Skin: Skin is warm, dry and intact. No rash noted.  Psychiatric: Her speech is normal and behavior is normal. Judgment normal. Her mood appears not anxious. Cognition and memory are normal. She does not exhibit a depressed mood.          Assessment & Plan:

## 2015-07-22 NOTE — Assessment & Plan Note (Signed)
Liekly allergic versus viral source. Treat allergies and symtpoms. Voice rest as able.

## 2015-07-22 NOTE — Progress Notes (Signed)
Pre visit review using our clinic review tool, if applicable. No additional management support is needed unless otherwise documented below in the visit note. 

## 2015-07-26 ENCOUNTER — Encounter: Payer: Self-pay | Admitting: Family Medicine

## 2015-07-26 ENCOUNTER — Ambulatory Visit (INDEPENDENT_AMBULATORY_CARE_PROVIDER_SITE_OTHER): Admitting: Family Medicine

## 2015-07-26 VITALS — BP 110/62 | HR 88 | Temp 97.9°F | Wt 195.0 lb

## 2015-07-26 DIAGNOSIS — J069 Acute upper respiratory infection, unspecified: Secondary | ICD-10-CM | POA: Diagnosis not present

## 2015-07-26 DIAGNOSIS — J040 Acute laryngitis: Secondary | ICD-10-CM

## 2015-07-26 NOTE — Progress Notes (Signed)
Pre visit review using our clinic review tool, if applicable. No additional management support is needed unless otherwise documented below in the visit note. 

## 2015-07-26 NOTE — Assessment & Plan Note (Signed)
Exam reassuring- likely remains a viral process. Continue supportive care. Call or return to clinic prn if these symptoms worsen or fail to improve as anticipated. The patient indicates understanding of these issues and agrees with the plan.

## 2015-07-26 NOTE — Progress Notes (Signed)
Subjective:   Patient ID: Jillian [name], female    DOB: 1955/12/09, 59 y.o.   MRN: [id number]  Jillian [name] is a pleasant 59 y.o. year old female who presents to clinic today with Nasal Congestion and congestion in chest  on 07/26/2015  HPI:  Saw my partner, Dr. Diona Browner on 12/2 (4 days ago) for laryngitis, runny nose and cough. Note reviewed- advised likely viral vs allergic source. Treat allergy and symptoms with flonase, mucinex and inhaled steroids.  She does not feel symptoms have worsened but they are not better like she hoped.  Still having a lot of sinus pressure, congestion and laryngitis. Taking all rxs as suggested.  Current Outpatient Prescriptions on File Prior to Visit  Medication Sig Dispense Refill  . desloratadine-pseudoephedrine (CLARINEX-D 12-HOUR) 2.5-120 MG per tablet Take 1 tablet by mouth 2 (two) times daily. 180 tablet 0  . diclofenac (VOLTAREN) 75 MG EC tablet Take 1 tablet (75 mg total) by mouth 2 (two) times daily. 180 tablet 3  . estradiol (VIVELLE-DOT) 0.025 MG/24HR PLACE 1 PATCH ONTO THE SKIN 2 TIMES A WEEK. 8 patch 1  . fluticasone (FLONASE) 50 MCG/ACT nasal spray Place 1 spray into both nostrils daily as needed. 48 g 2  . guaiFENesin-codeine (ROBITUSSIN AC) 100-10 MG/5ML syrup Take 5-10 mLs by mouth at bedtime as needed for cough. 180 mL 0  . hydrocortisone (ANUSOL-HC) 25 MG suppository Place 1 suppository (25 mg total) rectally 2 (two) times daily. 12 suppository 0  . methocarbamol (ROBAXIN) 500 MG tablet Take 500 mg by mouth 4 (four) times daily.     No current facility-administered medications on file prior to visit.    No Known Allergies  Past Medical History  Diagnosis Date  . Allergic rhinitis   . Vitamin D deficiency     Past Surgical History  Procedure Laterality Date  . Abdominal hysterectomy      Family History  Problem Relation Age of Onset  . Colon cancer Sister   . Colon polyps Sister   . Diabetes Mother     father and 9  siblings  . Heart disease Father     paternal grandmother  . Kidney disease Mother     renal failure    Social History   Social History  . Marital Status: Married    Spouse Name: N/A  . Number of Children: N/A  . Years of Education: N/A   Occupational History  . Not on file.   Social History Main Topics  . Smoking status: Former Smoker    Types: Cigarettes  . Smokeless tobacco: Never Used     Comment: 30 years  . Alcohol Use: No  . Drug Use: No  . Sexual Activity: Not on file   Other Topics Concern  . Not on file   Social History Narrative   The PMH, PSH, Social History, Family History, Medications, and allergies have been reviewed in [facility], and have been updated if relevant.    Review of Systems  Constitutional: Negative for fever.  HENT: Positive for congestion, ear pain, postnasal drip, rhinorrhea, sinus pressure and voice change. Negative for mouth sores and trouble swallowing.   Respiratory: Positive for cough. Negative for shortness of breath, wheezing and stridor.   Cardiovascular: Negative.   Gastrointestinal: Negative.   Musculoskeletal: Negative.   Skin: Negative.   All other systems reviewed and are negative.      Objective:    BP 110/62 mmHg  Pulse 88  Temp(Src) 97.9  F (36.6 C) (Oral)  Wt 195 lb (88.451 kg)  SpO2 93%   Physical Exam  Constitutional: She is oriented to person, place, and time. She appears well-developed and well-nourished. No distress.  HENT:  Head: Normocephalic and atraumatic.  Right Ear: Hearing normal. No drainage or swelling. A middle ear effusion is present.  Left Ear: Hearing and tympanic membrane normal.  Nose: Rhinorrhea present. Right sinus exhibits no maxillary sinus tenderness and no frontal sinus tenderness. Left sinus exhibits no maxillary sinus tenderness and no frontal sinus tenderness.  Eyes: Conjunctivae are normal.  Neck: Neck supple.  Cardiovascular: Normal rate and regular rhythm.   Pulmonary/Chest:  Effort normal and breath sounds normal. No respiratory distress. She has no wheezes.  Musculoskeletal: Normal range of motion.  Neurological: She is oriented to person, place, and time. No cranial nerve deficit.  Skin: Skin is warm and dry.  Psychiatric: She has a normal mood and affect. Her behavior is normal. Judgment and thought content normal.  Nursing note and vitals reviewed.         Assessment & Plan:   Laryngitis, acute  Acute upper respiratory infection No Follow-up on file.

## 2015-07-26 NOTE — Patient Instructions (Signed)
Great to see you. Happy Holidays. Keep doing what you have been doing and keep me updated.

## 2015-08-10 ENCOUNTER — Other Ambulatory Visit: Payer: Self-pay

## 2015-08-10 MED ORDER — FLUTICASONE PROPIONATE 50 MCG/ACT NA SUSP: 1.0000 | Freq: Every day | NASAL | Status: DC | PRN

## 2015-08-10 MED ORDER — DESLORATADINE-PSEUDOEPHED ER 2.5-120 MG PO TB12: 1.0000 | ORAL_TABLET | Freq: Two times a day (BID) | ORAL | Status: DC

## 2015-08-10 MED ORDER — ESTRADIOL 0.025 MG/24HR TD PTTW: MEDICATED_PATCH | TRANSDERMAL | Status: DC

## 2015-08-10 NOTE — Telephone Encounter (Signed)
Pt request refill clarinex D and flonase to [facility]; pt seen 07/22/15 and refill done per protocol. Pt has CPX scheduled for 09/13/15 and pt request years refill vivelle patch to [facility] [place].Please advise.

## 2015-09-08 ENCOUNTER — Other Ambulatory Visit: Payer: Self-pay | Admitting: Family Medicine

## 2015-09-08 DIAGNOSIS — E785 Hyperlipidemia, unspecified: Secondary | ICD-10-CM

## 2015-09-08 DIAGNOSIS — Z0000 Encounter for general adult medical examination without abnormal findings: Secondary | ICD-10-CM | POA: Insufficient documentation

## 2015-09-08 DIAGNOSIS — Z01419 Encounter for gynecological examination (general) (routine) without abnormal findings: Secondary | ICD-10-CM

## 2015-09-09 ENCOUNTER — Other Ambulatory Visit

## 2015-09-12 ENCOUNTER — Other Ambulatory Visit (INDEPENDENT_AMBULATORY_CARE_PROVIDER_SITE_OTHER)

## 2015-09-12 DIAGNOSIS — Z0000 Encounter for general adult medical examination without abnormal findings: Secondary | ICD-10-CM | POA: Diagnosis not present

## 2015-09-12 DIAGNOSIS — Z01419 Encounter for gynecological examination (general) (routine) without abnormal findings: Secondary | ICD-10-CM

## 2015-09-12 LAB — CBC WITH DIFFERENTIAL/PLATELET
BASOS ABS: 0 10*3/uL (ref 0.0–0.1)
Basophils Relative: 1 % (ref 0.0–3.0)
EOS ABS: 0.2 10*3/uL (ref 0.0–0.7)
Eosinophils Relative: 4.8 % (ref 0.0–5.0)
HCT: 40 % (ref 36.0–46.0)
Hemoglobin: 13.3 g/dL (ref 12.0–15.0)
LYMPHS ABS: 1.8 10*3/uL (ref 0.7–4.0)
Lymphocytes Relative: 47.9 % — ABNORMAL HIGH (ref 12.0–46.0)
MCHC: 33.1 g/dL (ref 30.0–36.0)
MCV: 85.5 fl (ref 78.0–100.0)
MONO ABS: 0.4 10*3/uL (ref 0.1–1.0)
Monocytes Relative: 10.4 % (ref 3.0–12.0)
NEUTROS ABS: 1.4 10*3/uL (ref 1.4–7.7)
NEUTROS PCT: 35.9 % — AB (ref 43.0–77.0)
PLATELETS: 240 10*3/uL (ref 150.0–400.0)
RBC: 4.68 Mil/uL (ref 3.87–5.11)
RDW: 14.5 % (ref 11.5–15.5)
WBC: 3.8 10*3/uL — ABNORMAL LOW (ref 4.0–10.5)

## 2015-09-12 LAB — COMPREHENSIVE METABOLIC PANEL
ALT: 28 U/L (ref 0–35)
AST: 30 U/L (ref 0–37)
Albumin: 3.9 g/dL (ref 3.5–5.2)
Alkaline Phosphatase: 60 U/L (ref 39–117)
BILIRUBIN TOTAL: 0.4 mg/dL (ref 0.2–1.2)
BUN: 11 mg/dL (ref 6–23)
CO2: 27 meq/L (ref 19–32)
Calcium: 8.9 mg/dL (ref 8.4–10.5)
Chloride: 107 mEq/L (ref 96–112)
Creatinine, Ser: 0.68 mg/dL (ref 0.40–1.20)
GFR: 113.62 mL/min (ref >60.00)
GLUCOSE: 99 mg/dL (ref 70–99)
Potassium: 4.3 mEq/L (ref 3.5–5.1)
SODIUM: 141 meq/L (ref 135–145)
TOTAL PROTEIN: 7.3 g/dL (ref 6.0–8.3)

## 2015-09-12 LAB — LIPID PANEL
CHOL/HDL RATIO: 2
Cholesterol: 202 mg/dL — ABNORMAL HIGH (ref 0–200)
HDL: 81.1 mg/dL (ref >39.00)
LDL Cholesterol: 114 mg/dL — ABNORMAL HIGH (ref 0–99)
NONHDL: 120.74
Triglycerides: 33 mg/dL (ref 0.0–149.0)
VLDL: 6.6 mg/dL (ref 0.0–40.0)

## 2015-09-12 LAB — TSH: TSH: 1.84 u[IU]/mL (ref 0.35–4.50)

## 2015-09-13 ENCOUNTER — Encounter: Admitting: Family Medicine

## 2015-09-20 ENCOUNTER — Encounter: Payer: Self-pay | Admitting: Family Medicine

## 2015-09-20 ENCOUNTER — Ambulatory Visit (INDEPENDENT_AMBULATORY_CARE_PROVIDER_SITE_OTHER): Admitting: Family Medicine

## 2015-09-20 VITALS — BP 114/78 | HR 91 | Temp 97.6°F | Ht 65.5 in | Wt 186.5 lb

## 2015-09-20 DIAGNOSIS — Z01419 Encounter for gynecological examination (general) (routine) without abnormal findings: Secondary | ICD-10-CM | POA: Diagnosis not present

## 2015-09-20 DIAGNOSIS — E785 Hyperlipidemia, unspecified: Secondary | ICD-10-CM | POA: Diagnosis not present

## 2015-09-20 DIAGNOSIS — E669 Obesity, unspecified: Secondary | ICD-10-CM | POA: Diagnosis not present

## 2015-09-20 NOTE — Progress Notes (Signed)
60 yo pleasant female here for CPX and follow up of chronic medical conditions.   S/p complete hysterectomy for fibroid tumors. On vivelle-DOT patch for hot flashes, vaginal dryness.  She feels this is controlling her symptoms well. Mammogram 05/17/15 Colonoscopy 07/24/2012    HLD- improved.  Lab Results  Component Value Date   CHOL 202* 09/12/2015   HDL 81.10 09/12/2015   LDLCALC 114* 09/12/2015   LDLDIRECT 158.6 08/18/2013   TRIG 33.0 09/12/2015   CHOLHDL 2 09/12/2015   Lab Results  Component Value Date   WBC 3.8* 09/12/2015   HGB 13.3 09/12/2015   HCT 40.0 09/12/2015   MCV 85.5 09/12/2015   PLT 240.0 09/12/2015   Lab Results  Component Value Date   CREATININE 0.68 09/12/2015   Lab Results  Component Value Date   TSH 1.84 09/12/2015    She is very physically active and has rejoined weight watchers. Wt Readings from Last 3 Encounters:  09/20/15 186 lb 8 oz (84.596 kg)  07/26/15 195 lb (88.451 kg)  07/22/15 195 lb 8 oz (88.678 kg)          Patient Active Problem List   Diagnosis Date Noted  . Obesity 09/20/2015  . Well woman exam 09/08/2015  . Former smoker 09/08/2014  . HLD (hyperlipidemia) 09/01/2013  . UNSPECIFIED VITAMIN D DEFICIENCY 07/03/2010  . POSTMENOPAUSAL STATUS 06/30/2009  . Allergic rhinitis 03/01/2009   Past Medical History  Diagnosis Date  . Allergic rhinitis   . Vitamin D deficiency    Past Surgical History  Procedure Laterality Date  . Abdominal hysterectomy     Social History  Substance Use Topics  . Smoking status: Former Smoker    Types: Cigarettes  . Smokeless tobacco: Never Used     Comment: 30 years  . Alcohol Use: No   Family History  Problem Relation Age of Onset  . Colon cancer Sister   . Colon polyps Sister   . Diabetes Mother     father and 9 siblings  . Heart disease Father     paternal grandmother  . Kidney disease Mother     renal failure   No Known Allergies Current Outpatient Prescriptions on File  Prior to Visit  Medication Sig Dispense Refill  . desloratadine-pseudoephedrine (CLARINEX-D 12-HOUR) 2.5-120 MG 12 hr tablet Take 1 tablet by mouth 2 (two) times daily. 180 tablet 0  . diclofenac (VOLTAREN) 75 MG EC tablet Take 1 tablet (75 mg total) by mouth 2 (two) times daily. 180 tablet 3  . estradiol (VIVELLE-DOT) 0.025 MG/24HR PLACE 1 PATCH ONTO THE SKIN 2 TIMES A WEEK. 8 patch 11  . fluticasone (FLONASE) 50 MCG/ACT nasal spray Place 1 spray into both nostrils daily as needed. 48 g 2  . hydrocortisone (ANUSOL-HC) 25 MG suppository Place 1 suppository (25 mg total) rectally 2 (two) times daily. 12 suppository 0  . methocarbamol (ROBAXIN) 500 MG tablet Take 500 mg by mouth 4 (four) times daily.     No current facility-administered medications on file prior to visit.   The PMH, PSH, Social History, Family History, Medications, and allergies have been reviewed in [facility], and have been updated if relevant.    Review of Systems  Constitutional: Negative.   HENT: Negative.   Eyes: Negative.   Respiratory: Negative.   Cardiovascular: Negative.   Gastrointestinal: Negative.   Endocrine: Negative.   Genitourinary: Negative.   Musculoskeletal: Negative.   Skin: Negative.   Allergic/Immunologic: Negative.   Neurological: Negative.   Hematological: Negative.  Psychiatric/Behavioral: Negative.   All other systems reviewed and are negative.   Wt Readings from Last 3 Encounters:  09/20/15 186 lb 8 oz (84.596 kg)  07/26/15 195 lb (88.451 kg)  07/22/15 195 lb 8 oz (88.678 kg)    Physical Exam  BP 114/78 mmHg  Pulse 91  Temp(Src) 97.6 F (36.4 C) (Oral)  Ht 5' 5.5" (1.664 m)  Wt 186 lb 8 oz (84.596 kg)  BMI 30.55 kg/m2  SpO2 98%  General:  Well-developed,well-nourished,in no acute distress; alert,appropriate and cooperative throughout examination Head:  normocephalic and atraumatic.   Eyes:  vision grossly intact, pupils equal, pupils round, and pupils reactive to light.   Ears:   R ear normal and L ear normal.   Nose:  no external deformity.   Mouth:  good dentition.   Neck:  No deformities, masses, or tenderness noted. Breasts:  No mass, nodules, thickening, tenderness, bulging, retraction, inflamation, nipple discharge or skin changes noted.   Lungs:  Normal respiratory effort, chest expands symmetrically. Lungs are clear to auscultation, no crackles or wheezes. Heart:  Normal rate and regular rhythm. S1 and S2 normal without gallop, murmur, click, rub or other extra sounds. Abdomen:  Bowel sounds positive,abdomen soft and non-tender without masses, organomegaly or hernias noted. Rectal:  no external abnormalities.     Msk:  No deformity or scoliosis noted of thoracic or lumbar spine.   Extremities:  No clubbing, cyanosis, edema, or deformity noted with normal full range of motion of all joints.   Neurologic:  alert & oriented X3 and gait normal.   Skin:  benign appearing skin tag noted on back of the neck Cervical Nodes:  No lymphadenopathy noted Axillary Nodes:  No palpable lymphadenopathy Psych:  Cognition and judgment appear intact. Alert and cooperative with normal attention span and concentration. No apparent delusions, illusions, hallucinations

## 2015-09-20 NOTE — Progress Notes (Signed)
Pre visit review using our clinic review tool, if applicable. No additional management support is needed unless otherwise documented below in the visit note. 

## 2015-09-20 NOTE — Assessment & Plan Note (Signed)
Diet controlled.  

## 2015-09-20 NOTE — Patient Instructions (Signed)
Great to see you! Keep up the great work!!    

## 2015-09-20 NOTE — Assessment & Plan Note (Signed)
Improving with weight watchers and walking. Encouraged her to keep up the good work.

## 2015-09-20 NOTE — Assessment & Plan Note (Signed)
Reviewed preventive care protocols, scheduled due services, and updated immunizations Discussed nutrition, exercise, diet, and healthy lifestyle.  

## 2015-12-15 ENCOUNTER — Ambulatory Visit: Admitting: Family Medicine

## 2015-12-19 ENCOUNTER — Other Ambulatory Visit: Payer: Self-pay

## 2015-12-19 MED ORDER — DESLORATADINE-PSEUDOEPHED ER 2.5-120 MG PO TB12: 1.0000 | ORAL_TABLET | Freq: Two times a day (BID) | ORAL | Status: DC

## 2015-12-19 NOTE — Telephone Encounter (Addendum)
Pt left v/m requesting refill clarinex d to [facility]. Last refilled # 180 on 08/10/15. Last seen 09/20/15. Pt also trying a weight loss program; pt needs goal weight; pt want to know if Dr Deborra Medina thinks a weight of 175 lbs is a good goal for pt.. Pt request cb.

## 2015-12-21 ENCOUNTER — Ambulatory Visit: Admitting: Family Medicine

## 2016-01-10 ENCOUNTER — Encounter: Payer: Self-pay | Admitting: Family Medicine

## 2016-01-10 ENCOUNTER — Ambulatory Visit (INDEPENDENT_AMBULATORY_CARE_PROVIDER_SITE_OTHER): Admitting: Family Medicine

## 2016-01-10 VITALS — BP 124/62 | HR 80 | Temp 97.6°F | Ht 67.0 in | Wt 183.5 lb

## 2016-01-10 DIAGNOSIS — L989 Disorder of the skin and subcutaneous tissue, unspecified: Secondary | ICD-10-CM | POA: Diagnosis not present

## 2016-01-10 DIAGNOSIS — L659 Nonscarring hair loss, unspecified: Secondary | ICD-10-CM | POA: Diagnosis not present

## 2016-01-10 LAB — CBC WITH DIFFERENTIAL/PLATELET
BASOS PCT: 0.6 % (ref 0.0–3.0)
Basophils Absolute: 0 10*3/uL (ref 0.0–0.1)
EOS ABS: 0.2 10*3/uL (ref 0.0–0.7)
Eosinophils Relative: 3.6 % (ref 0.0–5.0)
HCT: 39.8 % (ref 36.0–46.0)
HEMOGLOBIN: 13.3 g/dL (ref 12.0–15.0)
Lymphocytes Relative: 40.4 % (ref 12.0–46.0)
Lymphs Abs: 1.7 10*3/uL (ref 0.7–4.0)
MCHC: 33.3 g/dL (ref 30.0–36.0)
MCV: 85.4 fl (ref 78.0–100.0)
MONO ABS: 0.4 10*3/uL (ref 0.1–1.0)
Monocytes Relative: 9 % (ref 3.0–12.0)
Neutro Abs: 2 10*3/uL (ref 1.4–7.7)
Neutrophils Relative %: 46.4 % (ref 43.0–77.0)
PLATELETS: 246 10*3/uL (ref 150.0–400.0)
RBC: 4.67 Mil/uL (ref 3.87–5.11)
RDW: 14.1 % (ref 11.5–15.5)
WBC: 4.3 10*3/uL (ref 4.0–10.5)

## 2016-01-10 LAB — COMPREHENSIVE METABOLIC PANEL
ALK PHOS: 50 U/L (ref 39–117)
ALT: 26 U/L (ref 0–35)
AST: 27 U/L (ref 0–37)
Albumin: 4.1 g/dL (ref 3.5–5.2)
BUN: 20 mg/dL (ref 6–23)
CHLORIDE: 105 meq/L (ref 96–112)
CO2: 29 meq/L (ref 19–32)
Calcium: 9.3 mg/dL (ref 8.4–10.5)
Creatinine, Ser: 0.81 mg/dL (ref 0.40–1.20)
GFR: 92.74 mL/min (ref >60.00)
GLUCOSE: 89 mg/dL (ref 70–99)
POTASSIUM: 4.3 meq/L (ref 3.5–5.1)
Sodium: 137 mEq/L (ref 135–145)
TOTAL PROTEIN: 7 g/dL (ref 6.0–8.3)
Total Bilirubin: 0.4 mg/dL (ref 0.2–1.2)

## 2016-01-10 LAB — TSH: TSH: 1.28 u[IU]/mL (ref 0.35–4.50)

## 2016-01-10 LAB — VITAMIN D 25 HYDROXY (VIT D DEFICIENCY, FRACTURES): VITD: 34.46 ng/mL (ref 30.00–100.00)

## 2016-01-10 LAB — VITAMIN B12: Vitamin B-12: >1500 pg/mL — ABNORMAL HIGH (ref 211–911)

## 2016-01-10 MED ORDER — FLUOCINONIDE-E 0.05 % EX CREA: 1.0000 "application " | TOPICAL_CREAM | Freq: Two times a day (BID) | CUTANEOUS | Status: DC

## 2016-01-10 NOTE — Assessment & Plan Note (Signed)
?   Irritated callous. She does not feel that it is a callous.  Will try topical steroid cream twice daily x 10 days. Call or return to clinic prn if these symptoms worsen or fail to improve as anticipated. The patient indicates understanding of these issues and agrees with the plan.

## 2016-01-10 NOTE — Progress Notes (Signed)
Subjective:   Patient ID: Jillian [name], female    DOB: [birth year], 60 y.o.   MRN: [id number]  Jillian [name] is a pleasant 60 y.o. year old female who presents to clinic today with Hand Pain and Alopecia  on 01/10/2016  HPI:  Alopecia- has noticed more hair in her brush in the morning and in the shower over the past few months.  Not falling out in clumps and has not noticed any bald patches on her scalp. Diet has changed but in a good way- she is doing weight watchers but has not restricted herself from food groups.  Lesion her right hand- gets irritated and flakes for past few months. No known injury. She is right hand dominant.  Current Outpatient Prescriptions on File Prior to Visit  Medication Sig Dispense Refill  . desloratadine-pseudoephedrine (CLARINEX-D 12-HOUR) 2.5-120 MG 12 hr tablet Take 1 tablet by mouth 2 (two) times daily. 180 tablet 0  . diclofenac (VOLTAREN) 75 MG EC tablet Take 1 tablet (75 mg total) by mouth 2 (two) times daily. 180 tablet 3  . estradiol (VIVELLE-DOT) 0.025 MG/24HR PLACE 1 PATCH ONTO THE SKIN 2 TIMES A WEEK. 8 patch 11  . fluticasone (FLONASE) 50 MCG/ACT nasal spray Place 1 spray into both nostrils daily as needed. 48 g 2  . hydrocortisone (ANUSOL-HC) 25 MG suppository Place 1 suppository (25 mg total) rectally 2 (two) times daily. 12 suppository 0  . methocarbamol (ROBAXIN) 500 MG tablet Take 500 mg by mouth 4 (four) times daily.     No current facility-administered medications on file prior to visit.    No Known Allergies  Past Medical History  Diagnosis Date  . Allergic rhinitis   . Vitamin D deficiency     Past Surgical History  Procedure Laterality Date  . Abdominal hysterectomy      Family History  Problem Relation Age of Onset  . Colon cancer Sister   . Colon polyps Sister   . Diabetes Mother     father and 9 siblings  . Heart disease Father     paternal grandmother  . Kidney disease Mother     renal failure    Social  History   Social History  . Marital Status: Married    Spouse Name: N/A  . Number of Children: N/A  . Years of Education: N/A   Occupational History  . Not on file.   Social History Main Topics  . Smoking status: Former Smoker    Types: Cigarettes  . Smokeless tobacco: Never Used     Comment: 30 years  . Alcohol Use: No  . Drug Use: No  . Sexual Activity: Not on file   Other Topics Concern  . Not on file   Social History Narrative   The PMH, PSH, Social History, Family History, Medications, and allergies have been reviewed in [facility], and have been updated if relevant.   Review of Systems  Constitutional: Positive for fatigue.  Respiratory: Negative.   Cardiovascular: Negative.   Gastrointestinal: Negative.   Genitourinary: Negative.   Skin: Positive for wound.       +hair loss  Neurological: Negative.   All other systems reviewed and are negative.      Objective:    BP 124/62 mmHg  Pulse 80  Temp(Src) 97.6 F (36.4 C) (Oral)  Ht 5\' 7"  (1.702 m)  Wt 183 lb 8 oz (83.235 kg)  BMI 28.73 kg/m2  SpO2 99% Wt Readings from Last 3 Encounters:  01/10/16 183 lb 8 oz (83.235 kg)  09/20/15 186 lb 8 oz (84.596 kg)  07/26/15 195 lb (88.451 kg)     Physical Exam  Constitutional: She is oriented to person, place, and time. She appears well-developed and well-nourished. No distress.  HENT:  Head: Normocephalic.  Eyes: Conjunctivae are normal.  Cardiovascular: Normal rate.   Pulmonary/Chest: Effort normal.  Musculoskeletal: Normal range of motion.  Neurological: She is alert and oriented to person, place, and time. No cranial nerve deficit. Coordination normal.  Skin: No rash noted. She is not diaphoretic.     Psychiatric: She has a normal mood and affect. Her behavior is normal. Judgment and thought content normal.  Nursing note and vitals reviewed.         Assessment & Plan:   Hand lesion  Alopecia - Plan: Vitamin B12, CBC with Differential/Platelet, TSH,  Vitamin D, 25-hydroxy, Comprehensive metabolic panel No Follow-up on file.

## 2016-01-10 NOTE — Assessment & Plan Note (Signed)
New- no signs of hair loss on exam. Check labs today as initial part of work up. The patient indicates understanding of these issues and agrees with the plan. Orders Placed This Encounter  Procedures  . Vitamin B12  . CBC with Differential/Platelet  . TSH  . Vitamin D, 25-hydroxy  . Comprehensive metabolic panel

## 2016-01-10 NOTE — Progress Notes (Signed)
Pre visit review using our clinic review tool, if applicable. No additional management support is needed unless otherwise documented below in the visit note. 

## 2016-01-19 ENCOUNTER — Encounter: Payer: Self-pay | Admitting: Internal Medicine

## 2016-01-19 ENCOUNTER — Ambulatory Visit (INDEPENDENT_AMBULATORY_CARE_PROVIDER_SITE_OTHER): Admitting: Internal Medicine

## 2016-01-19 VITALS — BP 122/84 | HR 80 | Temp 97.9°F | Wt 179.0 lb

## 2016-01-19 DIAGNOSIS — J069 Acute upper respiratory infection, unspecified: Secondary | ICD-10-CM | POA: Diagnosis not present

## 2016-01-19 DIAGNOSIS — B9789 Other viral agents as the cause of diseases classified elsewhere: Principal | ICD-10-CM

## 2016-01-19 LAB — POCT RAPID STREP A (OFFICE): Rapid Strep A Screen: NEGATIVE

## 2016-01-19 NOTE — Progress Notes (Signed)
HPI  Pt presents to the clinic today with c/o headache, facial pain and pressure, runny nose, sorry throat and cough. This started 2 days ago. She is blwoig clear mucous out of her nose. She is having some difficulty swallowing. The cough is non productive. She denies fever, chills or shortness of breath. She has been taking Mucinex and salt water gargles with minimal relief. She does have allergies, and takes Claritin D and Flonase with minimal relief. She has not had sick contacts that she is aware of.   Review of Systems      Past Medical History  Diagnosis Date  . Allergic rhinitis   . Vitamin D deficiency     Family History  Problem Relation Age of Onset  . Colon cancer Sister   . Colon polyps Sister   . Diabetes Mother     father and 9 siblings  . Heart disease Father     paternal grandmother  . Kidney disease Mother     renal failure    Social History   Social History  . Marital Status: Married    Spouse Name: N/A  . Number of Children: N/A  . Years of Education: N/A   Occupational History  . Not on file.   Social History Main Topics  . Smoking status: Former Smoker    Types: Cigarettes  . Smokeless tobacco: Never Used     Comment: 30 years  . Alcohol Use: No  . Drug Use: No  . Sexual Activity: Not on file   Other Topics Concern  . Not on file   Social History Narrative    No Known Allergies   Constitutional: Positive headache, fatigue. Denies fever or abrupt weight changes.  HEENT:  Positive runny nose, sore throat. Denies eye redness, eye pain, pressure behind the eyes, facial pain, nasal congestion, ear pain, ringing in the ears, wax buildup, runny nose or bloody nose. Respiratory: Positive cough. Denies difficulty breathing or shortness of breath.  Cardiovascular: Denies chest pain, chest tightness, palpitations or swelling in the hands or feet.   No other specific complaints in a complete review of systems (except as listed in HPI  above).  Objective:   BP 122/84 mmHg  Pulse 80  Temp(Src) 97.9 F (36.6 C) (Oral)  Wt 179 lb (81.194 kg)  SpO2 98% Wt Readings from Last 3 Encounters:  01/19/16 179 lb (81.194 kg)  01/10/16 183 lb 8 oz (83.235 kg)  09/20/15 186 lb 8 oz (84.596 kg)     General: Appears her stated age, well developed, well nourished in NAD. HEENT: Head: normal shape and size, mild maxillary tenderness noted; Eyes: sclera white, no icterus, conjunctiva pink; Ears: Tm's gray and intact, normal light reflex; Nose: mucosa boggy and moist, septum midline; Throat/Mouth: + PND. Teeth present, mucosa erythematous and moist, no exudate noted, no lesions or ulcerations noted.  Neck: No cervical lymphadenopathy.  Cardiovascular: Normal rate and rhythm. S1,S2 noted.  No murmur, rubs or gallops noted.  Pulmonary/Chest: Normal effort and positive vesicular breath sounds. No respiratory distress. No wheezes, rales or ronchi noted.      Assessment & Plan:   Allergic Rhinitis:  RST: negative Get some rest and drink plenty of water Continue salt water gargles for the sore throat Continue Flonase and Claritin D She declines RX for cough syrup  RTC as needed or if symptoms persist.

## 2016-01-19 NOTE — Progress Notes (Signed)
Pre visit review using our clinic review tool, if applicable. No additional management support is needed unless otherwise documented below in the visit note. 

## 2016-01-19 NOTE — Patient Instructions (Signed)

## 2016-05-25 ENCOUNTER — Ambulatory Visit (INDEPENDENT_AMBULATORY_CARE_PROVIDER_SITE_OTHER)

## 2016-05-25 DIAGNOSIS — Z23 Encounter for immunization: Secondary | ICD-10-CM | POA: Diagnosis not present

## 2016-05-30 ENCOUNTER — Encounter: Payer: Self-pay | Admitting: Family Medicine

## 2016-06-15 ENCOUNTER — Encounter (INDEPENDENT_AMBULATORY_CARE_PROVIDER_SITE_OTHER): Payer: Self-pay | Admitting: Orthopedic Surgery

## 2016-06-15 ENCOUNTER — Ambulatory Visit (INDEPENDENT_AMBULATORY_CARE_PROVIDER_SITE_OTHER): Admitting: Orthopedic Surgery

## 2016-06-15 DIAGNOSIS — M19011 Primary osteoarthritis, right shoulder: Secondary | ICD-10-CM

## 2016-06-15 DIAGNOSIS — M7551 Bursitis of right shoulder: Secondary | ICD-10-CM

## 2016-06-15 NOTE — Progress Notes (Signed)
Office Visit Note   Patient: Jillian [name]           Date of Birth: 1955-10-24           MRN: [id number] Visit Date: 06/15/2016 Requested by: Lucille Passy, MD [place], [place] [address] PCP: Arnette Norris, MD  Subjective: No chief complaint on file. Jillian [name] comes back today for recheck on right shoulder.  She has done physical therapy and is getting stronger but she has not started working conditioning yet.  Still reports some fatigue with prolonged activity.  She is doing a home exercise program.  She denies any mechanical symptoms in the shoulder  .  Patient returns in followup s/p right shoulder scope, limited debridement underfraying surface biceps anchor, DCE, SAD on 02/13/16, 17 w 4 d postop.  Is feeling some better, still not completely better.  They are doing dry needling at PT and this has helped.  She is up to 6 lb weight at PT.  Planned to start work conditioning next week. Has HEP also.  Taking occasional oxycodone, not often.  Patient is still OOW.                 Review of Systems all systems reviewed and negative is a related to the chief complaint the fevers or chills   Assessment & Plan: Visit Diagnoses: No diagnosis found.  Plan: Impression is right shoulder pain following distal clavicle excision in general she's doing well and making progress she finished physical therapy but now needs to proceed to work conditioning before she returns to work on the see her back in 3 weeks after 3 weeks of work conditioning for repeat assessment likely rating and release at that time rehabilitation conference performed today as well with the rehabilitation counselor  Follow-Up Instructions: No Follow-up on file.   Orders:  No orders of the defined types were placed in this encounter.  No orders of the defined types were placed in this encounter.     Procedures: No procedures performed   Clinical Data: No additional findings.  Objective: Vital Signs:  There were no vitals taken for this visit.  Physical Exam  Ortho Exam  Specialty Comments:  No specialty comments available.  Imaging: No results found.   PMFS History: Patient Active Problem List   Diagnosis Date Noted  . Hand lesion 01/10/2016  . Alopecia 01/10/2016  . Obesity 09/20/2015  . Well woman exam 09/08/2015  . Former smoker 09/08/2014  . HLD (hyperlipidemia) 09/01/2013  . UNSPECIFIED VITAMIN D DEFICIENCY 07/03/2010  . POSTMENOPAUSAL STATUS 06/30/2009  . Allergic rhinitis 03/01/2009   Past Medical History:  Diagnosis Date  . Allergic rhinitis   . Vitamin D deficiency     Family History  Problem Relation Age of Onset  . Colon cancer Sister   . Colon polyps Sister   . Diabetes Mother     father and 9 siblings  . Heart disease Father     paternal grandmother  . Kidney disease Mother     renal failure    Past Surgical History:  Procedure Laterality Date  . ABDOMINAL HYSTERECTOMY     Social History   Occupational History  . Not on file.   Social History Main Topics  . Smoking status: Former Smoker    Types: Cigarettes  . Smokeless tobacco: Never Used     Comment: 30 years  . Alcohol use No  . Drug use: No  . Sexual activity: Not on  file

## 2016-07-03 ENCOUNTER — Telehealth: Payer: Self-pay

## 2016-07-03 MED ORDER — FLUTICASONE PROPIONATE 50 MCG/ACT NA SUSP: 1.0000 | Freq: Every day | NASAL | 2 refills | Status: DC | PRN

## 2016-07-03 MED ORDER — DESLORATADINE-PSEUDOEPHED ER 2.5-120 MG PO TB12: 1.0000 | ORAL_TABLET | Freq: Two times a day (BID) | ORAL | 0 refills | Status: DC

## 2016-07-03 NOTE — Progress Notes (Signed)
Created in error

## 2016-07-03 NOTE — Telephone Encounter (Signed)
Pt left v/m requesting refill for sudafed 2.5 mg, flonase nasal spray (done)and now meds by mail can send Climara patch 25 mg to meds by mail [facility]. Last annual 09/20/15. Please advise.

## 2016-07-04 MED ORDER — DESLORATADINE-PSEUDOEPHED ER 2.5-120 MG PO TB12: 1.0000 | ORAL_TABLET | Freq: Two times a day (BID) | ORAL | 0 refills | Status: DC

## 2016-07-04 MED ORDER — ESTRADIOL 0.025 MG/24HR TD PTTW: MEDICATED_PATCH | TRANSDERMAL | 11 refills | Status: DC

## 2016-07-04 NOTE — Telephone Encounter (Signed)
Please enter as refill request. 

## 2016-07-04 NOTE — Telephone Encounter (Signed)
eRxs sent. 

## 2016-07-04 NOTE — Telephone Encounter (Signed)
Were not able to enter these as a refill request. They are not on her current medication list and will have to be new Rx that come directly from you for the sudafed and Climara patch

## 2016-07-05 ENCOUNTER — Ambulatory Visit (INDEPENDENT_AMBULATORY_CARE_PROVIDER_SITE_OTHER): Payer: Self-pay | Admitting: Orthopedic Surgery

## 2016-07-06 ENCOUNTER — Ambulatory Visit (INDEPENDENT_AMBULATORY_CARE_PROVIDER_SITE_OTHER): Payer: Self-pay | Admitting: Orthopedic Surgery

## 2016-07-18 ENCOUNTER — Ambulatory Visit (INDEPENDENT_AMBULATORY_CARE_PROVIDER_SITE_OTHER): Admitting: Orthopedic Surgery

## 2016-07-18 ENCOUNTER — Encounter (INDEPENDENT_AMBULATORY_CARE_PROVIDER_SITE_OTHER): Payer: Self-pay | Admitting: Orthopedic Surgery

## 2016-07-18 DIAGNOSIS — M25511 Pain in right shoulder: Secondary | ICD-10-CM | POA: Diagnosis not present

## 2016-07-18 DIAGNOSIS — G8929 Other chronic pain: Secondary | ICD-10-CM

## 2016-07-18 NOTE — Progress Notes (Signed)
Office Visit Note   Patient: Jillian [name]           Date of Birth: 1955-10-17           MRN: [id number] Visit Date: 07/18/2016 Requested by: Lucille Passy, MD [place], [place] [address] PCP: Arnette Norris, MD  Subjective: Chief Complaint  Patient presents with  . Right Shoulder - Routine Post Op    HPI since he is a 60 year old female who underwent right shoulder arthroscopy subacromial decompression distal clavicle excision 02/13/2016.  Been doing reasonably well.  She's been doing work conditioning.  Work conditioning reevaluation today shows that she's made progress but she tested a light physical demand level.  Recommended work conditioning for 3 times a week for 4 more weeks.  We will request that and then plan on follow-up in 4-6 weeks for final recheck rating and release.  She's not taking any pain medicine except for occasional Robaxin and Aleve.              Review of Systems All systems reviewed are negative as they relate to the chief complaint within the history of present illness.  Patient denies  fevers or chills.    Assessment & Plan: Visit Diagnoses:  1. Chronic right shoulder pain     Plan: Plan is to continue his work conditioning with repeat evaluation in 4-6 weeks.  She is having a little bit of clicking around the coracoid process but no tenderness in that area, acromioclavicular joint itself is nontender.  We will follow this along at her next clinic visit nurse case manager is present for treatment plan discussion as well  Follow-Up Instructions: Return in about 8 weeks (around 09/12/2016).   Orders:  No orders of the defined types were placed in this encounter.  No orders of the defined types were placed in this encounter.     Procedures: No procedures performed   Clinical Data: No additional findings.  Objective: Vital Signs: There were no vitals taken for this visit.  Physical Exam  Constitutional: She appears  well-developed.  HENT:  Head: Normocephalic.  Eyes: EOM are normal.  Neck: Normal range of motion.  Cardiovascular: Normal rate.   Pulmonary/Chest: Effort normal.  Neurological: She is alert.  Skin: Skin is warm.  Psychiatric: She has a normal mood and affect.    Ortho Exam on exam she has full active and passive range of motion of the right shoulder.  A little bit of clicking around the coracoid process but no tenderness at the before meals joint.  Rotator cuff strength is excellent.  No real radiation of symptoms into the biceps tendon  Specialty Comments:  No specialty comments available.  Imaging: No results found.   PMFS History: Patient Active Problem List   Diagnosis Date Noted  . Hand lesion 01/10/2016  . Alopecia 01/10/2016  . Obesity 09/20/2015  . Well woman exam 09/08/2015  . Former smoker 09/08/2014  . HLD (hyperlipidemia) 09/01/2013  . UNSPECIFIED VITAMIN D DEFICIENCY 07/03/2010  . POSTMENOPAUSAL STATUS 06/30/2009  . Allergic rhinitis 03/01/2009   Past Medical History:  Diagnosis Date  . Allergic rhinitis   . Vitamin D deficiency     Family History  Problem Relation Age of Onset  . Colon cancer Sister   . Colon polyps Sister   . Diabetes Mother     father and 9 siblings  . Heart disease Father     paternal grandmother  . Kidney disease Mother  renal failure    Past Surgical History:  Procedure Laterality Date  . ABDOMINAL HYSTERECTOMY     Social History   Occupational History  . Not on file.   Social History Main Topics  . Smoking status: Former Smoker    Types: Cigarettes  . Smokeless tobacco: Never Used     Comment: 30 years  . Alcohol use No  . Drug use: No  . Sexual activity: Not on file

## 2016-09-17 ENCOUNTER — Telehealth (INDEPENDENT_AMBULATORY_CARE_PROVIDER_SITE_OTHER): Payer: Self-pay | Admitting: Orthopedic Surgery

## 2016-09-17 NOTE — Telephone Encounter (Signed)
Pt asking if we can do an ultrasound when she come in for appt Thurs. She is asking if we would have the time to do so. She said she's had it done before here.  [phone] or  [phone]

## 2016-09-19 NOTE — Telephone Encounter (Signed)
IC patient and advised [place] is here if Dr Marlou Sa needs it at her visit.

## 2016-09-20 ENCOUNTER — Ambulatory Visit (INDEPENDENT_AMBULATORY_CARE_PROVIDER_SITE_OTHER): Payer: Self-pay

## 2016-09-20 ENCOUNTER — Encounter (INDEPENDENT_AMBULATORY_CARE_PROVIDER_SITE_OTHER): Payer: Self-pay | Admitting: Orthopedic Surgery

## 2016-09-20 ENCOUNTER — Ambulatory Visit (INDEPENDENT_AMBULATORY_CARE_PROVIDER_SITE_OTHER): Admitting: Orthopedic Surgery

## 2016-09-20 DIAGNOSIS — M19011 Primary osteoarthritis, right shoulder: Secondary | ICD-10-CM | POA: Diagnosis not present

## 2016-09-20 NOTE — Progress Notes (Signed)
Office Visit Note   Patient: Jillian [name]           Date of Birth: [birth year]           MRN: [id number] Visit Date: 09/20/2016 Requested by: Lucille Passy, MD [place], [place] [address] PCP: Arnette Norris, MD  Subjective: Chief Complaint  Patient presents with  . Right Shoulder - Pain, Follow-up    HPI since he is a 61-year-old female follow up right shoulder distal clavicle excision and subacromial decompression.  This happened 02/13/2016.  She has seen physical therapy in the recommending more work conditioning type of exercises.  She's wondering if she needs more physical therapy.  Localizes most of her pain and tightness anteriorly.  Taking some over-the-counter medications on a limited basis.              Review of Systems All systems reviewed are negative as they relate to the chief complaint within the history of present illness.  Patient denies  fevers or chills.    Assessment & Plan: Visit Diagnoses:  1. Primary osteoarthritis of right shoulder     Plan: Impression right shoulder with good range of motion and strength which could potentially be improved with an alternating course of work conditioning and physical therapy.  The physical therapy I would aim more towards modalities to free up some of this underlying scar tissue around the anterior portion of the shoulder.  I think strengthening is pretty reasonable but her range of motion is very symmetric with the left side and thus I don't think there is much to be gained and that regard.  I think she could get stronger which would make her more functional with activities of daily living.  I would favor alternating workmen's comp conditioning (work conditioning) and physical therapy.  I'll see her back in 6 weeks for recheck may consider subacromial injection at that time if necessary.  Follow-Up Instructions: Return in about 6 weeks (around 11/01/2016).   Orders:  Orders Placed This Encounter  Procedures    . XR Shoulder Right   No orders of the defined types were placed in this encounter.     Procedures: No procedures performed   Clinical Data: No additional findings.  Objective: Vital Signs: There were no vitals taken for this visit.  Physical Exam   Constitutional: Patient appears well-developed HEENT:  Head: Normocephalic Eyes:EOM are normal Neck: Normal range of motion Cardiovascular: Normal rate Pulmonary/chest: Effort normal Neurologic: Patient is alert Skin: Skin is warm Psychiatric: Patient has normal mood and affect    Ortho Exam orthopedic exam demonstrates pretty full active and passive range of motion of the right shoulder compared to left.  No discrete tenderness of the before meals joint or with crossarm adduction.  She does have a little bit of tenderness inferior to that distal clavicle excision site.  Cuff strength is intact impingement signs equivocal not much in the way of course grinding crepitus present with internal/external rotation of the right arm and shoulder.  No other masses lymph adenopathy or skin changes of the shoulder region.  Specialty Comments:  No specialty comments available.  Imaging: Xr Shoulder Right  Result Date: 09/20/2016 Right shoulder 3 views reviewed AP outlet and axillary.  Distal clavicle resection looks good.  No bony spur regrowth.  Acromiohumeral distance maintained.  No glenohumeral arthritis is present.  Visualized lung fields clear.    PMFS History: Patient Active Problem List   Diagnosis Date Noted  .  Primary osteoarthritis of right shoulder 09/20/2016  . Hand lesion 01/10/2016  . Alopecia 01/10/2016  . Obesity 09/20/2015  . Well woman exam 09/08/2015  . Former smoker 09/08/2014  . HLD (hyperlipidemia) 09/01/2013  . UNSPECIFIED VITAMIN D DEFICIENCY 07/03/2010  . POSTMENOPAUSAL STATUS 06/30/2009  . Allergic rhinitis 03/01/2009   Past Medical History:  Diagnosis Date  . Allergic rhinitis   . Vitamin D  deficiency     Family History  Problem Relation Age of Onset  . Colon cancer Sister   . Colon polyps Sister   . Diabetes Mother     father and 9 siblings  . Heart disease Father     paternal grandmother  . Kidney disease Mother     renal failure    Past Surgical History:  Procedure Laterality Date  . ABDOMINAL HYSTERECTOMY     Social History   Occupational History  . Not on file.   Social History Main Topics  . Smoking status: Former Smoker    Types: Cigarettes  . Smokeless tobacco: Never Used     Comment: 30 years  . Alcohol use No  . Drug use: No  . Sexual activity: Not on file

## 2016-10-26 ENCOUNTER — Ambulatory Visit (INDEPENDENT_AMBULATORY_CARE_PROVIDER_SITE_OTHER): Payer: Self-pay | Admitting: Orthopedic Surgery

## 2016-11-01 ENCOUNTER — Ambulatory Visit (INDEPENDENT_AMBULATORY_CARE_PROVIDER_SITE_OTHER): Payer: Self-pay | Admitting: Orthopedic Surgery

## 2016-11-07 ENCOUNTER — Encounter (INDEPENDENT_AMBULATORY_CARE_PROVIDER_SITE_OTHER): Payer: Self-pay | Admitting: Orthopedic Surgery

## 2016-11-07 ENCOUNTER — Ambulatory Visit (INDEPENDENT_AMBULATORY_CARE_PROVIDER_SITE_OTHER): Admitting: Orthopedic Surgery

## 2016-11-07 DIAGNOSIS — M19011 Primary osteoarthritis, right shoulder: Secondary | ICD-10-CM

## 2016-11-12 NOTE — Progress Notes (Signed)
Office Visit Note   Patient: Jillian [name]           Date of Birth: 02-13-1956           MRN: [id number] Visit Date: 11/07/2016 Requested by: Lucille Passy, MD [place], [place] [address] PCP: Arnette Norris, MD  Subjective: Chief Complaint  Patient presents with  . Right Shoulder - Follow-up    HPI: Jillian [name] is a 61 year old patient who underwent right clavicle excision and subacromial decompression 02/13/2016.  She finished with physical therapy but his conditions continuing with more work conditioning.  Still has an aching pain but is better overall.  She takes occasional Tylenol and occasional diclofenac.              ROS: All systems reviewed are negative as they relate to the chief complaint within the history of present illness.  Patient denies  fevers or chills.   Assessment & Plan: Visit Diagnoses:  1. Primary osteoarthritis of right shoulder     Plan: Impression is patient's doing well following her right shoulder surgery.  She had subacromial decompression and arthroscopic distal clavicle excision.  She is at met maximal medical improvement.  She is rated at 4 % permanent partial disability for upper extremity impairment based on page 403 of the shoulder regional grid for upper extremity impairments in the [facility] rating guides sixth edition.  This is 3% for the distal clavicle excision and 1% 4 impingement syndrome She has some residual loss but is functional with normal motion.  I will see her back as needed  Follow-Up Instructions: Return if symptoms worsen or fail to improve.   Orders:  No orders of the defined types were placed in this encounter.  No orders of the defined types were placed in this encounter.     Procedures: No procedures performed   Clinical Data: No additional findings.  Objective: Vital Signs: There were no vitals taken for this visit.  Physical Exam:   Constitutional: Patient appears well-developed HEENT:  Head:  Normocephalic Eyes:EOM are normal Neck: Normal range of motion Cardiovascular: Normal rate Pulmonary/chest: Effort normal Neurologic: Patient is alert Skin: Skin is warm Psychiatric: Patient has normal mood and affect    Ortho Exam: Orthopedic exam demonstrates full active and passive range of motion of the right shoulder with some mild residual tenderness to direct palpation over the distal end of the clavicle.  She has negative impingement signs good rotator cuff strength.  No course grinding or crepitus is noted with active or passive range of motion.  There is no atrophy in the shoulder girdle region.  Incisions are well-healed.  Specialty Comments:  No specialty comments available.  Imaging: No results found.   PMFS History: Patient Active Problem List   Diagnosis Date Noted  . Primary osteoarthritis of right shoulder 09/20/2016  . Hand lesion 01/10/2016  . Alopecia 01/10/2016  . Obesity 09/20/2015  . Well woman exam 09/08/2015  . Former smoker 09/08/2014  . HLD (hyperlipidemia) 09/01/2013  . UNSPECIFIED VITAMIN D DEFICIENCY 07/03/2010  . POSTMENOPAUSAL STATUS 06/30/2009  . Allergic rhinitis 03/01/2009   Past Medical History:  Diagnosis Date  . Allergic rhinitis   . Vitamin D deficiency     Family History  Problem Relation Age of Onset  . Colon cancer Sister   . Colon polyps Sister   . Diabetes Mother     father and 9 siblings  . Heart disease Father     paternal grandmother  .  Kidney disease Mother     renal failure    Past Surgical History:  Procedure Laterality Date  . ABDOMINAL HYSTERECTOMY     Social History   Occupational History  . Not on file.   Social History Main Topics  . Smoking status: Former Smoker    Types: Cigarettes  . Smokeless tobacco: Never Used     Comment: 30 years  . Alcohol use No  . Drug use: No  . Sexual activity: Not on file

## 2016-11-21 ENCOUNTER — Telehealth (INDEPENDENT_AMBULATORY_CARE_PROVIDER_SITE_OTHER): Payer: Self-pay | Admitting: Orthopedic Surgery

## 2016-11-21 NOTE — Telephone Encounter (Signed)
Patient called to check on the status of a narrative letter that Dr. Marlou Sa was to write for her. She states they talked about it at her last office visit & she had not received a call to say it was ready.

## 2016-11-21 NOTE — Telephone Encounter (Signed)
I think she is referring to the rating that is in the note as artery been done please call thanks

## 2016-11-21 NOTE — Telephone Encounter (Signed)
See note from patient regarding a letter.

## 2016-11-22 NOTE — Telephone Encounter (Signed)
Tried to call patient back to discuss. No answer. Left message for her advising what Dr Marlou Sa had advised. II printed copy of last OV note with rating listed and put up front for her to come by and pick up. All of this information was left on her voicemail.  I advised if she had further questions to call us back.

## 2016-11-29 ENCOUNTER — Other Ambulatory Visit: Payer: Self-pay

## 2016-11-29 MED ORDER — DESLORATADINE-PSEUDOEPHED ER 2.5-120 MG PO TB12: 1.0000 | ORAL_TABLET | Freq: Two times a day (BID) | ORAL | 0 refills | Status: DC

## 2016-11-29 NOTE — Telephone Encounter (Signed)
Pt left v/m requesting refill clarinex D to meds by mail. Pt last annual exam on 09/20/15; last acute visit 01/19/16. No future appt scheduled. Dr Deborra Medina out of office and not on computer.Please advise.

## 2016-11-29 NOTE — Telephone Encounter (Signed)
Refilled x1, advising schedule CPE as due.

## 2016-11-30 ENCOUNTER — Encounter: Payer: Self-pay | Admitting: Family Medicine

## 2016-11-30 ENCOUNTER — Ambulatory Visit: Admitting: Internal Medicine

## 2016-11-30 ENCOUNTER — Ambulatory Visit (INDEPENDENT_AMBULATORY_CARE_PROVIDER_SITE_OTHER): Admitting: Family Medicine

## 2016-11-30 VITALS — BP 120/78 | HR 96 | Temp 98.3°F | Ht 67.0 in | Wt 213.2 lb

## 2016-11-30 DIAGNOSIS — R1900 Intra-abdominal and pelvic swelling, mass and lump, unspecified site: Secondary | ICD-10-CM

## 2016-11-30 NOTE — Patient Instructions (Signed)
-  We placed a referral for you as discussed for evaluation of the lump on your abdomen. It usually takes about 1 week to process and schedule this referral. If you have not heard from [place] or the surgery office regarding this appointment within 1 week please contact our office.

## 2016-11-30 NOTE — Progress Notes (Signed)
  HPI:  Acute visit for lump on abdomen: -noticed this morning when she rolled over in bed -no pain, pruritis, fever, malaise or similar lesions  ROS: See pertinent positives and negatives per HPI.  Past Medical History:  Diagnosis Date  . Allergic rhinitis   . Vitamin D deficiency     Past Surgical History:  Procedure Laterality Date  . ABDOMINAL HYSTERECTOMY      Family History  Problem Relation Age of Onset  . Colon cancer Sister   . Colon polyps Sister   . Diabetes Mother     father and 9 siblings  . Heart disease Father     paternal grandmother  . Kidney disease Mother     renal failure    Social History   Social History  . Marital status: Married    Spouse name: N/A  . Number of children: N/A  . Years of education: N/A   Social History Main Topics  . Smoking status: Former Smoker    Types: Cigarettes  . Smokeless tobacco: Never Used     Comment: 30 years  . Alcohol use No  . Drug use: No  . Sexual activity: Not Asked   Other Topics Concern  . None   Social History Narrative  . None     Current Outpatient Prescriptions:  .  desloratadine-pseudoephedrine (CLARINEX-D 12-HOUR) 2.5-120 MG 12 hr tablet, Take 1 tablet by mouth 2 (two) times daily., Disp: 180 tablet, Rfl: 0 .  diclofenac (VOLTAREN) 75 MG EC tablet, Take 1 tablet (75 mg total) by mouth 2 (two) times daily., Disp: 180 tablet, Rfl: 3 .  estradiol (VIVELLE-DOT) 0.025 MG/24HR, PLACE 1 PATCH ONTO THE SKIN 2 TIMES A WEEK., Disp: 8 patch, Rfl: 11 .  fluticasone (FLONASE) 50 MCG/ACT nasal spray, Place 1 spray into both nostrils daily as needed., Disp: 48 g, Rfl: 2 .  methocarbamol (ROBAXIN) 500 MG tablet, Take 500 mg by mouth 4 (four) times daily., Disp: , Rfl:  .  oxycodone (OXY-IR) 5 MG capsule, Take 5 mg by mouth every 4 (four) hours as needed., Disp: , Rfl:   EXAM:  Vitals:   11/30/16 1354  BP: 120/78  Pulse: 96  Temp: 98.3 F (36.8 C)    Body mass index is 33.39 kg/m.  GENERAL:  vitals reviewed and listed above, alert, oriented, appears well hydrated and in no acute distress  HEENT: atraumatic, conjunttiva clear, no obvious abnormalities on inspection of external nose and ears  NECK: no obvious masses on inspection  ABD/ABD WALL: 2x3 cm in diameter firm nontender semi-mobile subcut mass L upper lat abdomen, no erythema, discoloration or warmth  PSYCH: pleasant and cooperative, no obvious depression or anxiety  ASSESSMENT AND PLAN:  Discussed the following assessment and plan:  Abdominal wall mass of left flank - Plan: Ambulatory referral to General Surgery  -we discussed possible serious and likely etiologies, workup and treatment, treatment risks and return precautions - I can not exclude attachment or involvement of underlying structures, but seems to be more superficial in nature in abd wall such as lypoma or cyst or other. -after this discussion, Jillian [name] opted for evaluation with gen surg for likely removal -of course, we advised Jillian [name]  to return or notify a doctor immediately if symptoms worsen or persist or new concerns arise.   There are no Patient Instructions on file for this visit.  Colin Benton R., DO

## 2016-11-30 NOTE — Progress Notes (Signed)
Pre visit review using our clinic review tool, if applicable. No additional management support is needed unless otherwise documented below in the visit note. 

## 2016-12-16 ENCOUNTER — Other Ambulatory Visit (INDEPENDENT_AMBULATORY_CARE_PROVIDER_SITE_OTHER): Payer: Self-pay | Admitting: Orthopedic Surgery

## 2016-12-17 MED ORDER — DICLOFENAC SODIUM 75 MG PO TBEC: DELAYED_RELEASE_TABLET | ORAL | 2 refills | Status: DC

## 2016-12-17 NOTE — Telephone Encounter (Signed)
y

## 2016-12-17 NOTE — Telephone Encounter (Signed)
Ok to rf? 

## 2016-12-17 NOTE — Telephone Encounter (Signed)
Rx request 

## 2016-12-31 ENCOUNTER — Telehealth (INDEPENDENT_AMBULATORY_CARE_PROVIDER_SITE_OTHER): Payer: Self-pay | Admitting: Orthopedic Surgery

## 2016-12-31 NOTE — Telephone Encounter (Signed)
Patient called saying that her PT faxed over some progress notes to our office for her visit on Thursday. Just wanting you to know what they were for.

## 2016-12-31 NOTE — Telephone Encounter (Signed)
Have not seen them yet. Will keep lookout for them.

## 2017-01-03 ENCOUNTER — Encounter (INDEPENDENT_AMBULATORY_CARE_PROVIDER_SITE_OTHER): Payer: Self-pay | Admitting: Orthopedic Surgery

## 2017-01-03 ENCOUNTER — Telehealth (INDEPENDENT_AMBULATORY_CARE_PROVIDER_SITE_OTHER): Payer: Self-pay | Admitting: *Deleted

## 2017-01-03 ENCOUNTER — Ambulatory Visit (INDEPENDENT_AMBULATORY_CARE_PROVIDER_SITE_OTHER): Admitting: Orthopedic Surgery

## 2017-01-03 DIAGNOSIS — M25511 Pain in right shoulder: Secondary | ICD-10-CM | POA: Diagnosis not present

## 2017-01-03 DIAGNOSIS — G8929 Other chronic pain: Secondary | ICD-10-CM

## 2017-01-03 DIAGNOSIS — M19011 Primary osteoarthritis, right shoulder: Secondary | ICD-10-CM

## 2017-01-03 NOTE — Telephone Encounter (Signed)
Pt has appt today, pt stated she was not aware this was denied. Asking for a call please

## 2017-01-04 NOTE — Telephone Encounter (Signed)
I called pt back and left detailed message adv claim not denied and to call me back if she had any questions

## 2017-01-04 NOTE — Progress Notes (Signed)
Jillian [name] is a 61 year old patient with right shoulder primary osteoarthritis as her diagnosis.  Patient underwent right distal clavicle excision and subacromial decompression 02/13/2016.  She finished with physical therapy and had extensive work conditioning.  She still has some achiness in the shoulder at times but is better overall.  She takes occasional Tylenol and occasional diclofenac.  The patient's condition has reached maximal medical improvement.  The date of maximal medical improvement is 11/07/2016.  This data maximal medical improvement is chosen because she has plateaued in terms of range of motion strengthening and clinical improvement.  Her primary diagnosis is primary osteoarthritis of the right shoulder.  Patient underwent arthroscopic right distal clavicle excision and subacromial decompression on 02/13/2016.  She still has occasional subjective complaints of achy pain but in general her shoulder is better overall.  There is no permanent impairment of the right shoulder which preexisted the injury  The patient is at maximal medical improvement.  She is rated at 4% permanent partial disability for upper extremity impairment based on page 403 of the shoulder regional grid for upper extremity impairments in the [facility] rating guides sixth edition.  This is 3% for the distal clavicle excision and 1% for impingement syndrome.  She has some residual loss of function as well as mild loss of motion.  She has lost about 10 of external rotation on the right compared to the left and has lost approximately 10 of forward flexion on the right compared to the left the diagnosis based impairment rating compared to the range of motion method gives the higher rating and thus that is the method utilized to arrive at her permanent partial disability.  The range of motion rating does not achieve the 4% permanent partial disability rating.

## 2017-01-04 NOTE — Progress Notes (Signed)
Office Visit Note   Patient: Jillian [name]           Date of Birth: June 09, 1956           MRN: [id number] Visit Date: 01/03/2017 Requested by: Lucille Passy, MD [place], [place] [id number] PCP: Lucille Passy, MD  Subjective: Chief Complaint  Patient presents with  . Right Shoulder - Follow-up    HPI: Patient is a 61 year old female with right shoulder pain.  She underwent right distal clavicle excision and subacromial decompression on 02/13/2016.  She's had extensive physical therapy as follow-up.  She has been released and has retired from her work at the [employer].  In general she's doing reasonably well but still has some occasional pain in the shoulder.              ROS: All systems reviewed are negative as they relate to the chief complaint within the history of present illness.  Patient denies  fevers or chills.   Assessment & Plan: Visit Diagnoses:  1. Chronic right shoulder pain   2. Primary osteoarthritis, right shoulder     Plan: Impression is reasonable result following distal clavicle excision and subacromial decompression.  She's lost little bit of range of motion with terminal forward flexion and external rotation but in general her strength is good and her motion is functional and we'll see her back as needed  Follow-Up Instructions: No Follow-up on file.   Orders:  No orders of the defined types were placed in this encounter.  No orders of the defined types were placed in this encounter.     Procedures: No procedures performed   Clinical Data: No additional findings.  Objective: Vital Signs: There were no vitals taken for this visit.  Physical Exam:   Constitutional: Patient appears well-developed HEENT:  Head: Normocephalic Eyes:EOM are normal Neck: Normal range of motion Cardiovascular: Normal rate Pulmonary/chest: Effort normal Neurologic: Patient is alert Skin: Skin is warm Psychiatric: Patient has normal mood and  affect    Ortho Exam: Orthopedic exam demonstrates 170 of forward flexion on the right compared to 180 on the left.  External rotation at 15 abduction is about 650 on the right compared to 60 on the left.  Rotator cuff strength is intact impingement signs equivocal on the right negative on the left.  Not much in the way of discrete tenderness over the acromioclavicular joint on the right-hand side  Specialty Comments:  No specialty comments available.  Imaging: No results found.   PMFS History: Patient Active Problem List   Diagnosis Date Noted  . Primary osteoarthritis of right shoulder 09/20/2016  . Hand lesion 01/10/2016  . Alopecia 01/10/2016  . Obesity 09/20/2015  . Well woman exam 09/08/2015  . Former smoker 09/08/2014  . HLD (hyperlipidemia) 09/01/2013  . UNSPECIFIED VITAMIN D DEFICIENCY 07/03/2010  . POSTMENOPAUSAL STATUS 06/30/2009  . Allergic rhinitis 03/01/2009   Past Medical History:  Diagnosis Date  . Allergic rhinitis   . Vitamin D deficiency     Family History  Problem Relation Age of Onset  . Colon cancer Sister   . Colon polyps Sister   . Diabetes Mother        father and 9 siblings  . Heart disease Father        paternal grandmother  . Kidney disease Mother        renal failure    Past Surgical History:  Procedure Laterality Date  . ABDOMINAL HYSTERECTOMY  Social History   Occupational History  . Not on file.   Social History Main Topics  . Smoking status: Former Smoker    Types: Cigarettes  . Smokeless tobacco: Never Used     Comment: 30 years  . Alcohol use No  . Drug use: No  . Sexual activity: Not on file

## 2017-01-04 NOTE — Telephone Encounter (Signed)
I'm assuming so. The claim was denied due to Dx code, a corrected claim was sent and it was paid. Thank you!

## 2017-02-13 ENCOUNTER — Other Ambulatory Visit: Payer: Self-pay | Admitting: Family Medicine

## 2017-02-13 NOTE — Telephone Encounter (Signed)
Last refill 01/10/16 Last OV 01/10/16  Ok to refill?

## 2017-04-15 ENCOUNTER — Other Ambulatory Visit: Payer: Self-pay

## 2017-04-15 MED ORDER — DESLORATADINE-PSEUDOEPHED ER 2.5-120 MG PO TB12: 1.0000 | ORAL_TABLET | Freq: Two times a day (BID) | ORAL | 0 refills | Status: DC

## 2017-04-15 NOTE — Telephone Encounter (Signed)
Pt request refill clarinex D to meds by mail [facility]. Pt has CPX scheduled on 05/28/17. Refilled x 1 until CPX. Pt voiced understanding.

## 2017-05-15 ENCOUNTER — Other Ambulatory Visit: Payer: Self-pay | Admitting: Family Medicine

## 2017-05-15 DIAGNOSIS — Z01419 Encounter for gynecological examination (general) (routine) without abnormal findings: Secondary | ICD-10-CM

## 2017-05-21 ENCOUNTER — Other Ambulatory Visit (INDEPENDENT_AMBULATORY_CARE_PROVIDER_SITE_OTHER)

## 2017-05-21 DIAGNOSIS — Z1159 Encounter for screening for other viral diseases: Secondary | ICD-10-CM

## 2017-05-21 DIAGNOSIS — Z01419 Encounter for gynecological examination (general) (routine) without abnormal findings: Secondary | ICD-10-CM | POA: Diagnosis not present

## 2017-05-21 LAB — LIPID PANEL
CHOLESTEROL: 251 mg/dL — AB (ref 0–200)
HDL: 81 mg/dL (ref >39.00)
LDL CALC: 161 mg/dL — AB (ref 0–99)
NonHDL: 170.31
Total CHOL/HDL Ratio: 3
Triglycerides: 48 mg/dL (ref 0.0–149.0)
VLDL: 9.6 mg/dL (ref 0.0–40.0)

## 2017-05-21 LAB — COMPREHENSIVE METABOLIC PANEL
ALBUMIN: 4.1 g/dL (ref 3.5–5.2)
ALK PHOS: 45 U/L (ref 39–117)
ALT: 17 U/L (ref 0–35)
AST: 23 U/L (ref 0–37)
BILIRUBIN TOTAL: 0.5 mg/dL (ref 0.2–1.2)
BUN: 14 mg/dL (ref 6–23)
CHLORIDE: 105 meq/L (ref 96–112)
CO2: 28 mEq/L (ref 19–32)
CREATININE: 0.78 mg/dL (ref 0.40–1.20)
Calcium: 8.9 mg/dL (ref 8.4–10.5)
GFR: 96.43 mL/min (ref >60.00)
Glucose, Bld: 92 mg/dL (ref 70–99)
Potassium: 4.2 mEq/L (ref 3.5–5.1)
SODIUM: 139 meq/L (ref 135–145)
TOTAL PROTEIN: 7.1 g/dL (ref 6.0–8.3)

## 2017-05-21 LAB — CBC WITH DIFFERENTIAL/PLATELET
BASOS PCT: 0.9 % (ref 0.0–3.0)
Basophils Absolute: 0 10*3/uL (ref 0.0–0.1)
EOS ABS: 0.1 10*3/uL (ref 0.0–0.7)
EOS PCT: 3.8 % (ref 0.0–5.0)
HEMATOCRIT: 40.3 % (ref 36.0–46.0)
HEMOGLOBIN: 13.3 g/dL (ref 12.0–15.0)
LYMPHS PCT: 46.7 % — AB (ref 12.0–46.0)
Lymphs Abs: 1.8 10*3/uL (ref 0.7–4.0)
MCHC: 33.1 g/dL (ref 30.0–36.0)
MCV: 87 fl (ref 78.0–100.0)
Monocytes Absolute: 0.3 10*3/uL (ref 0.1–1.0)
Monocytes Relative: 8.9 % (ref 3.0–12.0)
NEUTROS ABS: 1.6 10*3/uL (ref 1.4–7.7)
Neutrophils Relative %: 39.7 % — ABNORMAL LOW (ref 43.0–77.0)
Platelets: 232 10*3/uL (ref 150.0–400.0)
RBC: 4.63 Mil/uL (ref 3.87–5.11)
RDW: 14.4 % (ref 11.5–15.5)
WBC: 3.9 10*3/uL — AB (ref 4.0–10.5)

## 2017-05-21 LAB — TSH: TSH: 2.19 u[IU]/mL (ref 0.35–4.50)

## 2017-05-22 LAB — HEPATITIS C ANTIBODY
Hepatitis C Ab: NONREACTIVE
SIGNAL TO CUT-OFF: 0.02 (ref <1.00)

## 2017-05-28 ENCOUNTER — Other Ambulatory Visit (HOSPITAL_COMMUNITY)
Admission: RE | Admit: 2017-05-28 | Discharge: 2017-05-28 | Disposition: A | Discharge status: Home / Self Care | Source: Ambulatory Visit | Attending: Family Medicine | Admitting: Family Medicine

## 2017-05-28 ENCOUNTER — Ambulatory Visit (INDEPENDENT_AMBULATORY_CARE_PROVIDER_SITE_OTHER): Admitting: Family Medicine

## 2017-05-28 ENCOUNTER — Encounter: Payer: Self-pay | Admitting: Family Medicine

## 2017-05-28 VITALS — BP 128/86 | HR 86 | Temp 98.1°F | Ht 65.5 in | Wt 211.8 lb

## 2017-05-28 DIAGNOSIS — E785 Hyperlipidemia, unspecified: Secondary | ICD-10-CM | POA: Diagnosis not present

## 2017-05-28 DIAGNOSIS — Z124 Encounter for screening for malignant neoplasm of cervix: Secondary | ICD-10-CM | POA: Diagnosis not present

## 2017-05-28 DIAGNOSIS — Z23 Encounter for immunization: Secondary | ICD-10-CM

## 2017-05-28 DIAGNOSIS — Z01419 Encounter for gynecological examination (general) (routine) without abnormal findings: Secondary | ICD-10-CM | POA: Diagnosis not present

## 2017-05-28 DIAGNOSIS — Z1151 Encounter for screening for human papillomavirus (HPV): Secondary | ICD-10-CM | POA: Insufficient documentation

## 2017-05-28 MED ORDER — DESLORATADINE-PSEUDOEPHED ER 2.5-120 MG PO TB12: 1.0000 | ORAL_TABLET | Freq: Two times a day (BID) | ORAL | 3 refills | Status: DC

## 2017-05-28 MED ORDER — FLUTICASONE PROPIONATE 50 MCG/ACT NA SUSP: 1.0000 | Freq: Every day | NASAL | 2 refills | Status: DC | PRN

## 2017-05-28 MED ORDER — ESTRADIOL 0.025 MG/24HR TD PTTW: MEDICATED_PATCH | TRANSDERMAL | 3 refills | Status: DC

## 2017-05-28 NOTE — Addendum Note (Signed)
Addended by: Mady Haagensen on: 05/28/2017 01:39 PM   Modules accepted: Orders

## 2017-05-28 NOTE — Addendum Note (Signed)
Addended by: Mady Haagensen on: 05/28/2017 03:00 PM   Modules accepted: Orders

## 2017-05-28 NOTE — Assessment & Plan Note (Signed)
Deteriorated. She will work on diet. Repeat labs in 2- 3 months.

## 2017-05-28 NOTE — Patient Instructions (Signed)
Great to see you.  Please work on [employer] like we discussed and we will plan to recheck your cholesterol in a few months.

## 2017-05-28 NOTE — Assessment & Plan Note (Signed)
Reviewed preventive care protocols, scheduled due services, and updated immunizations Discussed nutrition, exercise, diet, and healthy lifestyle.  

## 2017-05-28 NOTE — Progress Notes (Signed)
61 yo pleasant female here for CPX and follow up of chronic medical conditions.   S/p complete hysterectomy for fibroid tumors. She was told that she should still have a pap smear to scrape where she had her cervix due to dysplasia.   On vivelle-DOT patch for hot flashes, vaginal dryness.  She feels this is controlling her symptoms well. Mammogram 05/30/16 Mammogram scheduled for next week. Colonoscopy 07/24/2012  Zoster 11/10/13  HLD- deteriorated.  She is not sure why.  She did stop doing weight watchers. Lab Results  Component Value Date   CHOL 251 (H) 05/21/2017   HDL 81.00 05/21/2017   LDLCALC 161 (H) 05/21/2017   LDLDIRECT 158.6 08/18/2013   TRIG 48.0 05/21/2017   CHOLHDL 3 05/21/2017   Lab Results  Component Value Date   WBC 3.9 (L) 05/21/2017   HGB 13.3 05/21/2017   HCT 40.3 05/21/2017   MCV 87.0 05/21/2017   PLT 232.0 05/21/2017   Lab Results  Component Value Date   CREATININE 0.78 05/21/2017   Lab Results  Component Value Date   TSH 2.19 05/21/2017    Wt Readings from Last 3 Encounters:  05/28/17 211 lb 12.8 oz (96.1 kg)  11/30/16 213 lb 3.2 oz (96.7 kg)  01/19/16 179 lb (81.2 kg)          Patient Active Problem List   Diagnosis Date Noted  . Primary osteoarthritis of right shoulder 09/20/2016  . Alopecia 01/10/2016  . Obesity 09/20/2015  . Well woman exam 09/08/2015  . Former smoker 09/08/2014  . HLD (hyperlipidemia) 09/01/2013  . UNSPECIFIED VITAMIN D DEFICIENCY 07/03/2010  . POSTMENOPAUSAL STATUS 06/30/2009  . Allergic rhinitis 03/01/2009   Past Medical History:  Diagnosis Date  . Allergic rhinitis   . Vitamin D deficiency    Past Surgical History:  Procedure Laterality Date  . ABDOMINAL HYSTERECTOMY     Social History  Substance Use Topics  . Smoking status: Former Smoker    Types: Cigarettes  . Smokeless tobacco: Never Used     Comment: 30 years  . Alcohol use No   Family History  Problem Relation Age of Onset  . Colon  cancer Sister   . Colon polyps Sister   . Diabetes Mother        father and 9 siblings  . Heart disease Father        paternal grandmother  . Kidney disease Mother        renal failure   No Known Allergies Current Outpatient Prescriptions on File Prior to Visit  Medication Sig Dispense Refill  . desloratadine-pseudoephedrine (CLARINEX-D 12-HOUR) 2.5-120 MG 12 hr tablet Take 1 tablet by mouth 2 (two) times daily. 180 tablet 0  . diclofenac (VOLTAREN) 75 MG EC tablet Take 1 tablet once daily as needed for pain and inflammation. 30 tablet 2  . estradiol (VIVELLE-DOT) 0.025 MG/24HR PLACE 1 PATCH ONTO THE SKIN 2 TIMES A WEEK. 8 patch 11  . Fluocinonide Emulsified Base 0.05 % CREA APPLY  CREAM TO AFFECTED AREA TWICE DAILY 30 g 0  . fluticasone (FLONASE) 50 MCG/ACT nasal spray Place 1 spray into both nostrils daily as needed. 48 g 2  . methocarbamol (ROBAXIN) 500 MG tablet Take 500 mg by mouth 4 (four) times daily.    Marland Kitchen oxycodone (OXY-IR) 5 MG capsule Take 5 mg by mouth every 4 (four) hours as needed.     No current facility-administered medications on file prior to visit.    The PMH, PSH, Social History,  Family History, Medications, and allergies have been reviewed in [facility], and have been updated if relevant.    Review of Systems  Constitutional: Negative.   HENT: Negative.   Eyes: Negative.   Respiratory: Negative.   Cardiovascular: Negative.   Gastrointestinal: Negative.   Endocrine: Negative.   Genitourinary: Negative.   Musculoskeletal: Negative.   Skin: Negative.   Allergic/Immunologic: Negative.   Neurological: Negative.   Hematological: Negative.   Psychiatric/Behavioral: Negative.   All other systems reviewed and are negative.   Wt Readings from Last 3 Encounters:  05/28/17 211 lb 12.8 oz (96.1 kg)  11/30/16 213 lb 3.2 oz (96.7 kg)  01/19/16 179 lb (81.2 kg)    Physical Exam  BP 128/86 (BP Location: Right Arm, Patient Position: Sitting, Cuff Size: Normal)   Pulse  86   Temp 98.1 F (36.7 C) (Oral)   Ht 5' 5.5" (1.664 m)   Wt 211 lb 12.8 oz (96.1 kg)   SpO2 97%   BMI 34.71 kg/m    General:  Well-developed,well-nourished,in no acute distress; alert,appropriate and cooperative throughout examination Head:  normocephalic and atraumatic.   Eyes:  vision grossly intact, PERRL Ears:  R ear normal and L ear normal externally, TMs clear bilaterally Nose:  no external deformity.   Mouth:  good dentition.   Neck:  No deformities, masses, or tenderness noted. Breasts:  No mass, nodules, thickening, tenderness, bulging, retraction, inflamation, nipple discharge or skin changes noted.   Lungs:  Normal respiratory effort, chest expands symmetrically. Lungs are clear to auscultation, no crackles or wheezes. Heart:  Normal rate and regular rhythm. S1 and S2 normal without gallop, murmur, click, rub or other extra sounds. Abdomen:  Bowel sounds positive,abdomen soft and non-tender without masses, organomegaly or hernias noted. Rectal:  no external abnormalities.   Genitalia:  Pelvic Exam:        External: normal female genitalia without lesions or masses        Vagina: normal without lesions or masses        Cervix:absent        Adnexa: normal bimanual exam without masses or fullness        Uterus: absent        Pap smear: performed Msk:  No deformity or scoliosis noted of thoracic or lumbar spine.   Extremities:  No clubbing, cyanosis, edema, or deformity noted with normal full range of motion of all joints.   Neurologic:  alert & oriented X3 and gait normal.   Skin:  Intact without suspicious lesions or rashes Cervical Nodes:  No lymphadenopathy noted Axillary Nodes:  No palpable lymphadenopathy Psych:  Cognition and judgment appear intact. Alert and cooperative with normal attention span and concentration. No apparent delusions, illusions, hallucinations

## 2017-05-28 NOTE — Addendum Note (Signed)
Addended by: Mady Haagensen on: 05/28/2017 03:02 PM   Modules accepted: Orders

## 2017-05-30 LAB — CYTOLOGY - PAP
Adequacy: ABSENT
DIAGNOSIS: NEGATIVE
HPV (WINDOPATH): NOT DETECTED

## 2017-06-05 ENCOUNTER — Telehealth: Payer: Self-pay

## 2017-06-05 NOTE — Telephone Encounter (Signed)
Pt wanted to verify that clarinex,vivelle dot patches and flonase were sent to meds by mail. I advised done on10/09/18. Pt voiced understanding.

## 2017-06-06 ENCOUNTER — Encounter: Payer: Self-pay | Admitting: Family Medicine

## 2017-07-26 ENCOUNTER — Encounter: Payer: Self-pay | Admitting: Emergency Medicine

## 2017-07-26 ENCOUNTER — Emergency Department
Admission: EM | Admit: 2017-07-26 | Discharge: 2017-07-26 | Disposition: A | Discharge status: Home / Self Care | Payer: No Typology Code available for payment source | Attending: Emergency Medicine | Admitting: Emergency Medicine

## 2017-07-26 DIAGNOSIS — Z87891 Personal history of nicotine dependence: Secondary | ICD-10-CM | POA: Insufficient documentation

## 2017-07-26 DIAGNOSIS — M5442 Lumbago with sciatica, left side: Secondary | ICD-10-CM | POA: Insufficient documentation

## 2017-07-26 DIAGNOSIS — M545 Low back pain: Secondary | ICD-10-CM | POA: Diagnosis present

## 2017-07-26 DIAGNOSIS — M7918 Myalgia, other site: Secondary | ICD-10-CM

## 2017-07-26 DIAGNOSIS — M5432 Sciatica, left side: Secondary | ICD-10-CM

## 2017-07-26 HISTORY — DX: Sciatica, unspecified side: M54.30

## 2017-07-26 MED ORDER — LIDOCAINE 5 % EX PTCH
1.0000 | MEDICATED_PATCH | CUTANEOUS | Status: DC
  Administered 2017-07-26: 1 via TRANSDERMAL
  Filled 2017-07-26: qty 1

## 2017-07-26 MED ORDER — ONDANSETRON 4 MG PO TBDP: 4.0000 mg | ORAL_TABLET | Freq: Three times a day (TID) | ORAL | 0 refills | Status: DC | PRN

## 2017-07-26 MED ORDER — DICLOFENAC SODIUM 75 MG PO TBEC: 75.0000 mg | DELAYED_RELEASE_TABLET | Freq: Two times a day (BID) | ORAL | 0 refills | Status: DC

## 2017-07-26 MED ORDER — LIDOCAINE 5 % EX PTCH: 1.0000 | MEDICATED_PATCH | Freq: Two times a day (BID) | CUTANEOUS | 0 refills | Status: DC

## 2017-07-26 MED ORDER — ORPHENADRINE CITRATE 30 MG/ML IJ SOLN
60.0000 mg | Freq: Two times a day (BID) | INTRAMUSCULAR | Status: DC
  Administered 2017-07-26: 60 mg via INTRAMUSCULAR
  Filled 2017-07-26: qty 2

## 2017-07-26 MED ORDER — ONDANSETRON 4 MG PO TBDP
4.0000 mg | ORAL_TABLET | Freq: Once | ORAL | Status: AC
Start: 2017-07-26 — End: 2017-07-26
  Administered 2017-07-26: 4 mg via ORAL
  Filled 2017-07-26: qty 1

## 2017-07-26 MED ORDER — CYCLOBENZAPRINE HCL 5 MG PO TABS: ORAL_TABLET | ORAL | 0 refills | Status: DC

## 2017-07-26 MED ORDER — KETOROLAC TROMETHAMINE 30 MG/ML IJ SOLN
30.0000 mg | Freq: Once | INTRAMUSCULAR | Status: AC
  Administered 2017-07-26: 30 mg via INTRAMUSCULAR
  Filled 2017-07-26: qty 1
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
## 2017-07-29 ENCOUNTER — Encounter: Payer: Self-pay | Admitting: Family Medicine

## 2017-08-09 ENCOUNTER — Encounter: Payer: Self-pay | Admitting: Primary Care

## 2017-08-09 ENCOUNTER — Ambulatory Visit (INDEPENDENT_AMBULATORY_CARE_PROVIDER_SITE_OTHER): Admitting: Primary Care

## 2017-08-09 VITALS — BP 118/74 | HR 84 | Temp 98.1°F | Wt 208.5 lb

## 2017-08-09 DIAGNOSIS — G8929 Other chronic pain: Secondary | ICD-10-CM | POA: Diagnosis not present

## 2017-08-09 DIAGNOSIS — M545 Low back pain: Secondary | ICD-10-CM

## 2017-08-09 DIAGNOSIS — N952 Postmenopausal atrophic vaginitis: Secondary | ICD-10-CM | POA: Diagnosis not present

## 2017-08-09 DIAGNOSIS — E785 Hyperlipidemia, unspecified: Secondary | ICD-10-CM | POA: Diagnosis not present

## 2017-08-09 DIAGNOSIS — M19011 Primary osteoarthritis, right shoulder: Secondary | ICD-10-CM

## 2017-08-09 DIAGNOSIS — M5442 Lumbago with sciatica, left side: Secondary | ICD-10-CM | POA: Diagnosis not present

## 2017-08-09 DIAGNOSIS — M549 Dorsalgia, unspecified: Secondary | ICD-10-CM

## 2017-08-09 MED ORDER — LIDOCAINE 5 % EX PTCH: 1.0000 | MEDICATED_PATCH | Freq: Two times a day (BID) | CUTANEOUS | 0 refills | Status: DC

## 2017-08-09 MED ORDER — LIDOCAINE 5 % EX PTCH: 1.0000 | MEDICATED_PATCH | Freq: Two times a day (BID) | CUTANEOUS | 0 refills | Status: AC

## 2017-08-09 NOTE — Patient Instructions (Addendum)
I sent a refill for the lidoderm patches to your pharmacy.  Schedule a lab only appointment in 1 month to recheck your cholesterol.  Continue to work on stretching and exercises daily.  Follow up in October 2019 for your annual exam or sooner if needed.  It was a pleasure meeting you!
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
## 2017-08-09 NOTE — Assessment & Plan Note (Signed)
Previously following with orthopedics, no recent visit given improvement in pain.

## 2017-08-09 NOTE — Assessment & Plan Note (Signed)
Above goal on recent labs. Will recheck next month as she's been working on diet and exercise. Discussed the importance of a healthy diet and regular exercise in order for weight loss, and to reduce the risk of any potential medical problems.

## 2017-08-09 NOTE — Assessment & Plan Note (Signed)
Also with hot flashes. Discussed to consider weaning off estrogen in the future. Discussed potential risks for long term estrogen use, she verbalized understanding.
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
## 2017-08-28 ENCOUNTER — Ambulatory Visit (INDEPENDENT_AMBULATORY_CARE_PROVIDER_SITE_OTHER): Admitting: Orthopedic Surgery

## 2017-08-28 ENCOUNTER — Encounter (INDEPENDENT_AMBULATORY_CARE_PROVIDER_SITE_OTHER): Payer: Self-pay | Admitting: Orthopedic Surgery

## 2017-08-28 DIAGNOSIS — G8929 Other chronic pain: Secondary | ICD-10-CM

## 2017-08-28 DIAGNOSIS — M5442 Lumbago with sciatica, left side: Secondary | ICD-10-CM | POA: Diagnosis not present

## 2017-08-30 ENCOUNTER — Encounter (INDEPENDENT_AMBULATORY_CARE_PROVIDER_SITE_OTHER): Payer: Self-pay | Admitting: Orthopedic Surgery
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
## 2017-09-05 ENCOUNTER — Ambulatory Visit (INDEPENDENT_AMBULATORY_CARE_PROVIDER_SITE_OTHER): Payer: Self-pay | Admitting: Orthopedic Surgery

## 2017-09-23 ENCOUNTER — Other Ambulatory Visit

## 2017-10-01 ENCOUNTER — Other Ambulatory Visit (INDEPENDENT_AMBULATORY_CARE_PROVIDER_SITE_OTHER)

## 2017-10-01 DIAGNOSIS — E785 Hyperlipidemia, unspecified: Secondary | ICD-10-CM

## 2017-10-01 LAB — LIPID PANEL
CHOL/HDL RATIO: 3
Cholesterol: 217 mg/dL — ABNORMAL HIGH (ref 0–200)
HDL: 74.2 mg/dL (ref >39.00)
LDL CALC: 132 mg/dL — AB (ref 0–99)
NonHDL: 143.23
Triglycerides: 56 mg/dL (ref 0.0–149.0)
VLDL: 11.2 mg/dL (ref 0.0–40.0)

## 2018-01-07 ENCOUNTER — Telehealth: Payer: Self-pay | Admitting: Primary Care

## 2018-01-07 NOTE — Telephone Encounter (Signed)
Noted. Will send prior auth

## 2018-01-07 NOTE — Telephone Encounter (Signed)
Copied from [place] [id number]. Topic: Quick Communication - See Telephone Encounter >> Jan 07, 2018  9:59 AM Cleaster Corin, NT wrote: CRM for notification. See Telephone encounter for: 01/07/18.  Prior auth. Need for med. Fluocinonide Emulsified Base 0.05 % CREA [[id number]]   [facility] [address], [place] - [place] [address] Phone: [phone] Fax: [phone]

## 2018-01-08 ENCOUNTER — Other Ambulatory Visit: Payer: Self-pay | Admitting: Family Medicine

## 2018-01-08 DIAGNOSIS — L853 Xerosis cutis: Secondary | ICD-10-CM

## 2018-01-09 NOTE — Telephone Encounter (Signed)
KC-Plz see refill req/thx dmf 

## 2018-01-09 NOTE — Telephone Encounter (Signed)
What's she using this cream for? There's no obvious indication in her chart.

## 2018-01-10 NOTE — Telephone Encounter (Signed)
Spoken to patient. She stated that the cream is for her dry skin and mainly use on her hands. This is use only when the skins starts to crack and very dry.

## 2018-01-10 NOTE — Telephone Encounter (Signed)
I don't see anything on problem list that indicates she needs this refilled. Will defer to PCP

## 2018-01-10 NOTE — Telephone Encounter (Addendum)
Ok to refill?  Rx have not been prescribed by Allie Bossier. Last seen on 08/09/2017.

## 2018-01-11 NOTE — Telephone Encounter (Signed)
Please ask patient the reason she's using this fluocinonide cream. How often does she use this? Has she tried anything else? Will you also explain that it's requiring a prior authorization?

## 2018-01-11 NOTE — Telephone Encounter (Signed)
Noted. Who gave her the original prescription? Dermatology? Also see phone note questions regarding frequency of use. Was this refilled yet?

## 2018-01-14 NOTE — Telephone Encounter (Signed)
This is address in another refill encounter as well.

## 2018-01-14 NOTE — Telephone Encounter (Signed)
Spoken to patient. She stated that Dr Deborra Medina is the one original prescribed this medication last year. She had tried OTC and moisturizer. Dr Deborra Medina tried something before this but could not remember what it was. She uses this sparingly and when she does, it is twice a daily until it get better in 2-4 days. No this has been refill yet. Patient if possible to give an extra refill.

## 2018-01-14 NOTE — Telephone Encounter (Signed)
Noted, refill sent to pharmacy for large tube.

## 2018-02-19 ENCOUNTER — Encounter: Payer: Self-pay | Admitting: Family Medicine

## 2018-02-19 ENCOUNTER — Ambulatory Visit (INDEPENDENT_AMBULATORY_CARE_PROVIDER_SITE_OTHER): Admitting: Family Medicine

## 2018-02-19 VITALS — BP 128/78 | HR 75 | Temp 98.4°F | Ht 65.5 in | Wt 213.0 lb

## 2018-02-19 DIAGNOSIS — R102 Pelvic and perineal pain: Secondary | ICD-10-CM | POA: Insufficient documentation

## 2018-02-19 MED ORDER — IBUPROFEN 800 MG PO TABS: 800.0000 mg | ORAL_TABLET | Freq: Three times a day (TID) | ORAL | 0 refills | Status: DC | PRN

## 2018-02-19 NOTE — Patient Instructions (Addendum)
Start home stretching.  Use heat, massage.  Start ibuprofen 800 mg every eight hours as needed for pain.

## 2018-02-19 NOTE — Progress Notes (Signed)
   Subjective:    Patient ID: Jillian [name], female    DOB: March 09, 1956, 62 y.o.   MRN: [id number]  HPI  62 year old female with history of chronic back pain presents for  New onset groin pain.  She reports   Late last week.. She did a lot of gardening sitting on a bucket.   No fall or injury.   This does not feel like sciatica.   She is having  6 days of pain in right  Upper inner thigh.. Pain at rest but worse with walking.  Pain 6/10.. Started at 8/10.Marland Kitchen Has had some improvement but still bothering her.  No swelling, no skin change.  Start tylenol for pain. Helped minimally.  Review of Systems  Constitutional: Negative for fatigue and fever.  HENT: Negative for congestion.   Eyes: Negative for pain.  Respiratory: Negative for cough and shortness of breath.   Cardiovascular: Negative for chest pain, palpitations and leg swelling.  Gastrointestinal: Negative for abdominal pain.  Genitourinary: Negative for dysuria and vaginal bleeding.  Musculoskeletal: Negative for back pain.  Neurological: Negative for syncope, light-headedness and headaches.  Psychiatric/Behavioral: Negative for dysphoric mood.       Objective:   Physical Exam  Constitutional: Vital signs are normal. She appears well-developed and well-nourished. She is cooperative.  Non-toxic appearance. She does not appear ill. No distress.  HENT:  Head: Normocephalic.  Right Ear: Hearing, tympanic membrane, external ear and ear canal normal. Tympanic membrane is not erythematous, not retracted and not bulging.  Left Ear: Hearing, tympanic membrane, external ear and ear canal normal. Tympanic membrane is not erythematous, not retracted and not bulging.  Nose: No mucosal edema or rhinorrhea. Right sinus exhibits no maxillary sinus tenderness and no frontal sinus tenderness. Left sinus exhibits no maxillary sinus tenderness and no frontal sinus tenderness.  Mouth/Throat: Uvula is midline, oropharynx is clear and  moist and mucous membranes are normal.  Eyes: Pupils are equal, round, and reactive to light. Conjunctivae, EOM and lids are normal. Lids are everted and swept, no foreign bodies found.  Neck: Trachea normal and normal range of motion. Neck supple. Carotid bruit is not present. No thyroid mass and no thyromegaly present.  Cardiovascular: Normal rate, regular rhythm, S1 normal, S2 normal, normal heart sounds, intact distal pulses and normal pulses. Exam reveals no gallop and no friction rub.  No murmur heard. Pulmonary/Chest: Effort normal and breath sounds normal. No tachypnea. No respiratory distress. She has no decreased breath sounds. She has no wheezes. She has no rhonchi. She has no rales.  Abdominal: Soft. Normal appearance and bowel sounds are normal. There is no tenderness.  Musculoskeletal:       Right hip: Normal.       Left hip: Normal.       Thoracic back: Normal.       Lumbar back: Normal.  ttp in SI joint and at insertion of pelvic muscles.  neg SLR,  Positive faber's  No anterior thigh pain.  Neurological: She is alert. She has normal strength. No cranial nerve deficit or sensory deficit.  Skin: Skin is warm, dry and intact. No rash noted.  Psychiatric: Her speech is normal and behavior is normal. Judgment and thought content normal. Her mood appears not anxious. Cognition and memory are normal. She does not exhibit a depressed mood.          Assessment & Plan:

## 2018-02-19 NOTE — Assessment & Plan Note (Signed)
Not typical groin strain.. Injury to pelvic muscles and possibly SI joint.  treat with NSAIDs, heat, and home PT info given.

## 2018-04-08 ENCOUNTER — Telehealth: Payer: Self-pay | Admitting: *Deleted

## 2018-04-08 DIAGNOSIS — Z1211 Encounter for screening for malignant neoplasm of colon: Secondary | ICD-10-CM

## 2018-04-08 NOTE — Telephone Encounter (Signed)
Copied from [place] [id number]. Topic: General - Other >> Apr 08, 2018  4:41 PM Marin Olp L wrote: Reason for CRM: Patient would like a colonoscopy because her sister was diagnosed with colon cancer.

## 2018-04-08 NOTE — Telephone Encounter (Signed)
Please ask patient: How old was her sister when she was diagnosed? Is this a recent finding? Is she experiencing any symptoms (bloody stools, unexplained weight loss, weakness)?   She once saw Dr. Kennedy Bucker for colon cancer screening in 2013, looks like she was clear and he recommended it be repeated in 2023.  I can send Dr. Fuller Plan a message to see what he thinks, or she can call their office directly.

## 2018-04-09 NOTE — Telephone Encounter (Signed)
Message left for patient to return my call.  

## 2018-04-11 NOTE — Telephone Encounter (Signed)
Noted, referral placed.  

## 2018-04-11 NOTE — Telephone Encounter (Signed)
Spoken and notified patient of Jillian Millers comments. Patient stated that her sister was Dx at age 62 last year and finding were polys. Also she was not experiencing any symptoms.   Patient stated that her sister's doctor inform family that it is suggested to get this done before the standard 10 years. Patient stated can Jillian [name] put the referral in to Jillian [name] so they know why she wants it done earlier.

## 2018-04-11 NOTE — Addendum Note (Signed)
Addended by: Pleas Koch on: 04/11/2018 10:42 PM   Modules accepted: Orders

## 2018-04-15 ENCOUNTER — Telehealth: Payer: Self-pay | Admitting: Gastroenterology

## 2018-04-15 NOTE — Telephone Encounter (Signed)
Pt is being referred for colonoscopy because her sister has hx of colon polyps. Pt had colon with Dr. Fuller Plan on 2013 and is not due until 2023. She states not to have any gi issues except for constipation. Is it ok to schedule pt for a repeat colon or should she wait until recall date? Pls advise.

## 2018-04-15 NOTE — Telephone Encounter (Signed)
If her sister has precancerous colon polyp(s) the patient would be due for a higher risk screening colonoscopy now. If that is the case please schedule. If she cannot find out if polyps were precancerous also ok to schedule based on her family history.

## 2018-04-15 NOTE — Telephone Encounter (Signed)
See message. Will her sisters hx of polyps change her recall date?

## 2018-04-15 NOTE — Telephone Encounter (Signed)
Patient's sister was a colon cancer and a pre-cancerous polyps.  She has been scheduled for endo/colon 10/15 and 10:30

## 2018-05-21 ENCOUNTER — Other Ambulatory Visit: Payer: Self-pay | Admitting: Primary Care

## 2018-05-21 DIAGNOSIS — E785 Hyperlipidemia, unspecified: Secondary | ICD-10-CM

## 2018-05-29 ENCOUNTER — Other Ambulatory Visit (INDEPENDENT_AMBULATORY_CARE_PROVIDER_SITE_OTHER)

## 2018-05-29 ENCOUNTER — Other Ambulatory Visit

## 2018-05-29 DIAGNOSIS — E785 Hyperlipidemia, unspecified: Secondary | ICD-10-CM | POA: Diagnosis not present

## 2018-05-29 LAB — LIPID PANEL
CHOL/HDL RATIO: 3
Cholesterol: 232 mg/dL — ABNORMAL HIGH (ref 0–200)
HDL: 86.8 mg/dL (ref >39.00)
LDL Cholesterol: 134 mg/dL — ABNORMAL HIGH (ref 0–99)
NONHDL: 144.98
Triglycerides: 56 mg/dL (ref 0.0–149.0)
VLDL: 11.2 mg/dL (ref 0.0–40.0)

## 2018-05-29 LAB — COMPREHENSIVE METABOLIC PANEL
ALT: 18 U/L (ref 0–35)
AST: 20 U/L (ref 0–37)
Albumin: 4.2 g/dL (ref 3.5–5.2)
Alkaline Phosphatase: 53 U/L (ref 39–117)
BILIRUBIN TOTAL: 0.5 mg/dL (ref 0.2–1.2)
BUN: 14 mg/dL (ref 6–23)
CO2: 29 meq/L (ref 19–32)
Calcium: 9.2 mg/dL (ref 8.4–10.5)
Chloride: 104 mEq/L (ref 96–112)
Creatinine, Ser: 0.81 mg/dL (ref 0.40–1.20)
GFR: 92.01 mL/min (ref >60.00)
GLUCOSE: 95 mg/dL (ref 70–99)
Potassium: 4 mEq/L (ref 3.5–5.1)
SODIUM: 140 meq/L (ref 135–145)
Total Protein: 7.8 g/dL (ref 6.0–8.3)

## 2018-06-02 ENCOUNTER — Encounter: Payer: Self-pay | Admitting: Primary Care

## 2018-06-02 ENCOUNTER — Ambulatory Visit (INDEPENDENT_AMBULATORY_CARE_PROVIDER_SITE_OTHER): Admitting: Primary Care

## 2018-06-02 VITALS — BP 122/78 | HR 84 | Temp 98.0°F | Ht 65.5 in | Wt 217.0 lb

## 2018-06-02 DIAGNOSIS — M19011 Primary osteoarthritis, right shoulder: Secondary | ICD-10-CM | POA: Diagnosis not present

## 2018-06-02 DIAGNOSIS — L853 Xerosis cutis: Secondary | ICD-10-CM | POA: Diagnosis not present

## 2018-06-02 DIAGNOSIS — Z0000 Encounter for general adult medical examination without abnormal findings: Secondary | ICD-10-CM | POA: Diagnosis not present

## 2018-06-02 DIAGNOSIS — J309 Allergic rhinitis, unspecified: Secondary | ICD-10-CM

## 2018-06-02 DIAGNOSIS — E785 Hyperlipidemia, unspecified: Secondary | ICD-10-CM

## 2018-06-02 DIAGNOSIS — M545 Low back pain: Secondary | ICD-10-CM | POA: Diagnosis not present

## 2018-06-02 DIAGNOSIS — G8929 Other chronic pain: Secondary | ICD-10-CM

## 2018-06-02 DIAGNOSIS — Z23 Encounter for immunization: Secondary | ICD-10-CM | POA: Diagnosis not present

## 2018-06-02 DIAGNOSIS — E6609 Other obesity due to excess calories: Secondary | ICD-10-CM

## 2018-06-02 DIAGNOSIS — Z6835 Body mass index (BMI) 35.0-35.9, adult: Secondary | ICD-10-CM

## 2018-06-02 DIAGNOSIS — N952 Postmenopausal atrophic vaginitis: Secondary | ICD-10-CM

## 2018-06-02 MED ORDER — FLUOCINONIDE EMULSIFIED BASE 0.05 % EX CREA: TOPICAL_CREAM | CUTANEOUS | 0 refills | Status: DC

## 2018-06-02 MED ORDER — IBUPROFEN 800 MG PO TABS: 800.0000 mg | ORAL_TABLET | Freq: Three times a day (TID) | ORAL | 0 refills | Status: DC | PRN

## 2018-06-02 MED ORDER — CETIRIZINE HCL 10 MG PO TABS: ORAL_TABLET | ORAL | 0 refills | Status: DC

## 2018-06-02 MED ORDER — FLUTICASONE PROPIONATE 50 MCG/ACT NA SUSP: 1.0000 | Freq: Every day | NASAL | 2 refills | Status: DC | PRN

## 2018-06-02 MED ORDER — ESTRADIOL 0.025 MG/24HR TD PTTW: MEDICATED_PATCH | TRANSDERMAL | 1 refills | Status: DC

## 2018-06-02 NOTE — Assessment & Plan Note (Signed)
Requesting weight loss medication for appetite suppressant. Discussed that I do not prescribe these medications. Discussed changes to make in her diet, referral placed to health weight loss management through Dr. Leafy Ro.

## 2018-06-02 NOTE — Assessment & Plan Note (Signed)
Managed on Clarinex-D daily for years. Discussed today that this is not safe for daily use. Rx for Zyrtec sent to pharmacy. Also discussed that she will likely have rebound symptoms for several days once she stops Clarinex. She will update. Continue Flonase.

## 2018-06-02 NOTE — Progress Notes (Signed)
Subjective:    Patient ID: Jillian [name], female    DOB: 28-Nov-1955, 62 y.o.   MRN: [id number]  HPI  Jillian [name] is a 62 year old female who presents today for complete physical.  Immunizations: -Tetanus: Completed in 2010 -Influenza: Due -Shingles: On wait list  Diet: She endorses a fair diet Breakfast: Oatmeal with almond milk, fruit, eggs Lunch: Skips, sandwiches Dinner: Vegetables, fried chicken, roasted chicken, pork chops; twice weekly fast food. Snacks: Popcorn, peanuts, [place] bar, ice cream, chocolate Desserts: Five days weekly Beverages: Water, Coffee  Exercise: She is working out at [employer] three days weekly, walking 10,000 steps daily.  Eye exam: Completed last month Dental exam: Completes semi-annually  Colonoscopy: Completed in 2013, due for November 2019 Pap Smear: Completed in 2018 Mammogram: Due next month Hep C Screen: Completed in 2018   Review of Systems  Constitutional: Negative for unexpected weight change.  HENT: Negative for rhinorrhea.   Respiratory: Negative for cough and shortness of breath.   Cardiovascular: Negative for chest pain.  Gastrointestinal: Negative for constipation and diarrhea.  Genitourinary: Negative for difficulty urinating and menstrual problem.  Musculoskeletal: Negative for arthralgias and myalgias.  Skin: Negative for rash.       Dry skin to hands  Allergic/Immunologic: Negative for environmental allergies.  Neurological: Negative for dizziness, numbness and headaches.  Psychiatric/Behavioral: The patient is not nervous/anxious.    BP Readings from Last 3 Encounters:  06/02/18 122/78  02/19/18 128/78  08/09/17 118/74        Past Medical History:  Diagnosis Date  . Allergic rhinitis   . Sciatica   . Vitamin D deficiency      Social History   Socioeconomic History  . Marital status: Married    Spouse name: Not on file  . Number of children: Not on file  . Years of education: Not on file    . Highest education level: Not on file  Occupational History  . Not on file  Social Needs  . Financial resource strain: Not on file  . Food insecurity:    Worry: Not on file    Inability: Not on file  . Transportation needs:    Medical: Not on file    Non-medical: Not on file  Tobacco Use  . Smoking status: Former Smoker    Types: Cigarettes  . Smokeless tobacco: Never Used  . Tobacco comment: 30 years  Substance and Sexual Activity  . Alcohol use: No    Frequency: Never  . Drug use: No  . Sexual activity: Not on file  Lifestyle  . Physical activity:    Days per week: Not on file    Minutes per session: Not on file  . Stress: Not on file  Relationships  . Social connections:    Talks on phone: Not on file    Gets together: Not on file    Attends religious service: Not on file    Active member of club or organization: Not on file    Attends meetings of clubs or organizations: Not on file    Relationship status: Not on file  . Intimate partner violence:    Fear of current or ex partner: Not on file    Emotionally abused: Not on file    Physically abused: Not on file    Forced sexual activity: Not on file  Other Topics Concern  . Not on file  Social History Narrative   ** Merged History Encounter **  Past Surgical History:  Procedure Laterality Date  . ABDOMINAL HYSTERECTOMY      Family History  Problem Relation Age of Onset  . Colon cancer Sister   . Colon polyps Sister   . Diabetes Mother        father and 9 siblings  . Heart disease Father        paternal grandmother  . Kidney disease Mother        renal failure    No Known Allergies  Current Outpatient Medications on File Prior to Visit  Medication Sig Dispense Refill  . lidocaine (LIDODERM) 5 % Place 1 patch onto the skin every 12 (twelve) hours. Remove & Discard patch within 12 hours. 10 patch 0   No current facility-administered medications on file prior to visit.     BP 122/78    Pulse 84   Temp 98 F (36.7 C) (Oral)   Ht 5' 5.5" (1.664 m)   Wt 217 lb (98.4 kg)   SpO2 97%   BMI 35.56 kg/m    Objective:   Physical Exam  Constitutional: She is oriented to person, place, and time. She appears well-nourished.  HENT:  Mouth/Throat: No oropharyngeal exudate.  Eyes: Pupils are equal, round, and reactive to light. EOM are normal.  Neck: Neck supple.  Cardiovascular: Normal rate and regular rhythm.  Respiratory: Effort normal and breath sounds normal.  GI: Soft. Bowel sounds are normal. There is no tenderness.  Musculoskeletal: Normal range of motion.  Neurological: She is alert and oriented to person, place, and time.  Skin: Skin is warm and dry.  Several flesh colored dry spots to bilateral palmer hands  Psychiatric: She has a normal mood and affect.           Assessment & Plan:

## 2018-06-02 NOTE — Assessment & Plan Note (Signed)
Using estradiol patches twice weekly on average. She will work on reducing to once weekly and update.

## 2018-06-02 NOTE — Assessment & Plan Note (Signed)
Lipids unremarkable. Continue to monitor.

## 2018-06-02 NOTE — Assessment & Plan Note (Signed)
Immunizations UTD, influenza vaccination provided today. Mammogram scheduled. Pap smear UTD. Colonoscopy UTD.  Recommended to work on diet, continue exercising. Referral placed to weight loss management through Dr. Leafy Ro.  Exam unremarkable.  Labs reviewed. Follow up in 1 year for CPE.

## 2018-06-02 NOTE — Assessment & Plan Note (Signed)
Using Ibuprofen PRN, refill provided today.BMP unremarkable.

## 2018-06-02 NOTE — Assessment & Plan Note (Signed)
Chronic, also with scaly patches to palmer hands. Some improvement with Lidex, but without resolve. Referral placed to dermatology for further evaluation.

## 2018-06-02 NOTE — Patient Instructions (Signed)
Continue exercising. You should be getting 150 minutes of moderate intensity exercise weekly.  Limit fried food, take out food, snacking. Increase vegetables, fruit, whole grains, lean protein, water.  Ensure you are consuming 64 ounces of water daily.  You will be contacted regarding your referral to Weight Management and Dermatology.  Please let us know if you have not been contacted within one week.   Stop using Clarinex-D for allergies as this can be dangerous for the heart with long term use. Start certirizine (Zyrtec) instead, take once daily at bedtime.  We will see you in one year for your annual exam or sooner if needed.  It was a pleasure to see you today!   Preventive Care 40-64 Years, Female Preventive care refers to lifestyle choices and visits with your health care provider that can promote health and wellness. What does preventive care include?  A yearly physical exam. This is also called an annual well check.  Dental exams once or twice a year.  Routine eye exams. Ask your health care provider how often you should have your eyes checked.  Personal lifestyle choices, including: ? Daily care of your teeth and gums. ? Regular physical activity. ? Eating a healthy diet. ? Avoiding tobacco and drug use. ? Limiting alcohol use. ? Practicing safe sex. ? Taking low-dose aspirin daily starting at age 58. ? Taking vitamin and mineral supplements as recommended by your health care provider. What happens during an annual well check? The services and screenings done by your health care provider during your annual well check will depend on your age, overall health, lifestyle risk factors, and family history of disease. Counseling Your health care provider may ask you questions about your:  Alcohol use.  Tobacco use.  Drug use.  Emotional well-being.  Home and relationship well-being.  Sexual activity.  Eating habits.  Work and work Statistician.  Method of  birth control.  Menstrual cycle.  Pregnancy history.  Screening You may have the following tests or measurements:  Height, weight, and BMI.  Blood pressure.  Lipid and cholesterol levels. These may be checked every 5 years, or more frequently if you are over 53 years old.  Skin check.  Lung cancer screening. You may have this screening every year starting at age 45 if you have a 30-pack-year history of smoking and currently smoke or have quit within the past 15 years.  Fecal occult blood test (FOBT) of the stool. You may have this test every year starting at age 29.  Flexible sigmoidoscopy or colonoscopy. You may have a sigmoidoscopy every 5 years or a colonoscopy every 10 years starting at age 9.  Hepatitis C blood test.  Hepatitis B blood test.  Sexually transmitted disease (STD) testing.  Diabetes screening. This is done by checking your blood sugar (glucose) after you have not eaten for a while (fasting). You may have this done every 1-3 years.  Mammogram. This may be done every 1-2 years. Talk to your health care provider about when you should start having regular mammograms. This may depend on whether you have a family history of breast cancer.  BRCA-related cancer screening. This may be done if you have a family history of breast, ovarian, tubal, or peritoneal cancers.  Pelvic exam and Pap test. This may be done every 3 years starting at age 6. Starting at age 25, this may be done every 5 years if you have a Pap test in combination with an HPV test.  Bone density scan.  This is done to screen for osteoporosis. You may have this scan if you are at high risk for osteoporosis.  Discuss your test results, treatment options, and if necessary, the need for more tests with your health care provider. Vaccines Your health care provider may recommend certain vaccines, such as:  Influenza vaccine. This is recommended every year.  Tetanus, diphtheria, and acellular pertussis  (Tdap, Td) vaccine. You may need a Td booster every 10 years.  Varicella vaccine. You may need this if you have not been vaccinated.  Zoster vaccine. You may need this after age 14.  Measles, mumps, and rubella (MMR) vaccine. You may need at least one dose of MMR if you were born in 1957 or later. You may also need a second dose.  Pneumococcal 13-valent conjugate (PCV13) vaccine. You may need this if you have certain conditions and were not previously vaccinated.  Pneumococcal polysaccharide (PPSV23) vaccine. You may need one or two doses if you smoke cigarettes or if you have certain conditions.  Meningococcal vaccine. You may need this if you have certain conditions.  Hepatitis A vaccine. You may need this if you have certain conditions or if you travel or work in places where you may be exposed to hepatitis A.  Hepatitis B vaccine. You may need this if you have certain conditions or if you travel or work in places where you may be exposed to hepatitis B.  Haemophilus influenzae type b (Hib) vaccine. You may need this if you have certain conditions.  Talk to your health care provider about which screenings and vaccines you need and how often you need them. This information is not intended to replace advice given to you by your health care provider. Make sure you discuss any questions you have with your health care provider. Document Released: 09/02/2015 Document Revised: 04/25/2016 Document Reviewed: 06/07/2015 Elsevier Interactive Patient Education  [employer].

## 2018-06-03 ENCOUNTER — Encounter (INDEPENDENT_AMBULATORY_CARE_PROVIDER_SITE_OTHER)

## 2018-06-03 ENCOUNTER — Ambulatory Visit (AMBULATORY_SURGERY_CENTER): Payer: Self-pay

## 2018-06-03 VITALS — Ht 65.5 in | Wt 216.8 lb

## 2018-06-03 DIAGNOSIS — Z800 Family history of malignant neoplasm of digestive organs: Secondary | ICD-10-CM

## 2018-06-03 MED ORDER — NA SULFATE-K SULFATE-MG SULF 17.5-3.13-1.6 GM/177ML PO SOLN: 1.0000 | Freq: Once | ORAL | 0 refills | Status: AC

## 2018-06-03 NOTE — Progress Notes (Signed)
Denies allergies to eggs or soy products. Denies complication of anesthesia or sedation. Denies use of weight loss medication. Denies use of O2.   Emmi instructions given declined.

## 2018-06-04 MED ORDER — CETIRIZINE HCL 10 MG PO TABS: ORAL_TABLET | ORAL | 0 refills | Status: DC

## 2018-06-04 NOTE — Addendum Note (Signed)
Addended by: Jacqualin Combes on: 06/04/2018 08:31 AM   Modules accepted: Orders

## 2018-06-06 ENCOUNTER — Encounter: Payer: Self-pay | Admitting: *Deleted

## 2018-06-10 LAB — HM MAMMOGRAPHY

## 2018-06-12 ENCOUNTER — Ambulatory Visit (INDEPENDENT_AMBULATORY_CARE_PROVIDER_SITE_OTHER): Admitting: Family Medicine

## 2018-06-12 ENCOUNTER — Encounter: Payer: Self-pay | Admitting: Primary Care

## 2018-06-12 ENCOUNTER — Encounter (INDEPENDENT_AMBULATORY_CARE_PROVIDER_SITE_OTHER): Payer: Self-pay | Admitting: Family Medicine

## 2018-06-12 VITALS — BP 135/84 | HR 80 | Ht 66.0 in | Wt 212.0 lb

## 2018-06-12 DIAGNOSIS — R0602 Shortness of breath: Secondary | ICD-10-CM | POA: Diagnosis not present

## 2018-06-12 DIAGNOSIS — Z1331 Encounter for screening for depression: Secondary | ICD-10-CM

## 2018-06-12 DIAGNOSIS — Z9189 Other specified personal risk factors, not elsewhere classified: Secondary | ICD-10-CM | POA: Diagnosis not present

## 2018-06-12 DIAGNOSIS — R5383 Other fatigue: Secondary | ICD-10-CM

## 2018-06-12 DIAGNOSIS — Z0289 Encounter for other administrative examinations: Secondary | ICD-10-CM

## 2018-06-12 DIAGNOSIS — R739 Hyperglycemia, unspecified: Secondary | ICD-10-CM | POA: Diagnosis not present

## 2018-06-12 DIAGNOSIS — E559 Vitamin D deficiency, unspecified: Secondary | ICD-10-CM

## 2018-06-12 DIAGNOSIS — E669 Obesity, unspecified: Secondary | ICD-10-CM

## 2018-06-12 DIAGNOSIS — E538 Deficiency of other specified B group vitamins: Secondary | ICD-10-CM

## 2018-06-12 DIAGNOSIS — Z6834 Body mass index (BMI) 34.0-34.9, adult: Secondary | ICD-10-CM

## 2018-06-13 LAB — CBC WITH DIFFERENTIAL
BASOS ABS: 0 10*3/uL (ref 0.0–0.2)
Basos: 1 %
EOS (ABSOLUTE): 0.1 10*3/uL (ref 0.0–0.4)
Eos: 3 %
Hematocrit: 42 % (ref 34.0–46.6)
Hemoglobin: 13.5 g/dL (ref 11.1–15.9)
Immature Grans (Abs): 0 10*3/uL (ref 0.0–0.1)
Immature Granulocytes: 0 %
LYMPHS ABS: 1.8 10*3/uL (ref 0.7–3.1)
Lymphs: 39 %
MCH: 27.4 pg (ref 26.6–33.0)
MCHC: 32.1 g/dL (ref 31.5–35.7)
MCV: 85 fL (ref 79–97)
Monocytes Absolute: 0.4 10*3/uL (ref 0.1–0.9)
Monocytes: 9 %
NEUTROS ABS: 2.2 10*3/uL (ref 1.4–7.0)
NEUTROS PCT: 48 %
RBC: 4.93 x10E6/uL (ref 3.77–5.28)
RDW: 13.5 % (ref 12.3–15.4)
WBC: 4.6 10*3/uL (ref 3.4–10.8)

## 2018-06-13 LAB — COMPREHENSIVE METABOLIC PANEL
ALK PHOS: 79 IU/L (ref 39–117)
ALT: 21 IU/L (ref 0–32)
AST: 26 IU/L (ref 0–40)
Albumin/Globulin Ratio: 1.5 (ref 1.2–2.2)
Albumin: 4.4 g/dL (ref 3.6–4.8)
BUN/Creatinine Ratio: 14 (ref 12–28)
BUN: 10 mg/dL (ref 8–27)
Bilirubin Total: 0.2 mg/dL (ref 0.0–1.2)
CO2: 24 mmol/L (ref 20–29)
CREATININE: 0.7 mg/dL (ref 0.57–1.00)
Calcium: 9.4 mg/dL (ref 8.7–10.3)
Chloride: 102 mmol/L (ref 96–106)
GFR calc Af Amer: 107 mL/min/{1.73_m2} (ref >59)
GFR, EST NON AFRICAN AMERICAN: 93 mL/min/{1.73_m2} (ref >59)
GLOBULIN, TOTAL: 3 g/dL (ref 1.5–4.5)
GLUCOSE: 82 mg/dL (ref 65–99)
Potassium: 4.2 mmol/L (ref 3.5–5.2)
SODIUM: 138 mmol/L (ref 134–144)
Total Protein: 7.4 g/dL (ref 6.0–8.5)

## 2018-06-13 LAB — INSULIN, RANDOM: INSULIN: 8.5 u[IU]/mL (ref 2.6–24.9)

## 2018-06-13 LAB — TSH: TSH: 1.79 u[IU]/mL (ref 0.450–4.500)

## 2018-06-13 LAB — T3: T3, Total: 96 ng/dL (ref 71–180)

## 2018-06-13 LAB — HEMOGLOBIN A1C
Est. average glucose Bld gHb Est-mCnc: 114 mg/dL
HEMOGLOBIN A1C: 5.6 % (ref 4.8–5.6)

## 2018-06-13 LAB — T4, FREE: FREE T4: 1.18 ng/dL (ref 0.82–1.77)

## 2018-06-13 LAB — FOLATE

## 2018-06-13 LAB — VITAMIN B12: Vitamin B-12: >2000 pg/mL — ABNORMAL HIGH (ref 232–1245)

## 2018-06-13 LAB — VITAMIN D 25 HYDROXY (VIT D DEFICIENCY, FRACTURES): VIT D 25 HYDROXY: 36.1 ng/mL (ref 30.0–100.0)

## 2018-06-17 NOTE — Progress Notes (Signed)
Office: [phone]  /  Fax: [phone]   Dear Pleas Koch, NP,   Thank you for referring Jillian [name] to our clinic. The following note includes my evaluation and treatment recommendations.  HPI:   Chief Complaint: OBESITY    Jillian [name] has been referred by Pleas Koch, NP for consultation regarding her obesity and obesity related comorbidities.    Jillian [name] (MR# [id number]) is a 62 y.o. female who presents on 06/12/2018 for obesity evaluation and treatment. Current BMI is Body mass index is 34.22 kg/m.Marland Kitchen Jillian [name] has been struggling with her weight for many years and has been unsuccessful in either losing weight, maintaining weight loss, or reaching her healthy weight goal.     Jillian [name] attended our information session and states she is currently in the action stage of change and ready to dedicate time achieving and maintaining a healthier weight. Jillian [name] is interested in becoming our patient and working on intensive lifestyle modifications including (but not limited to) diet, exercise and weight loss.    Jillian [name] states her family eats meals together she thinks her family will eat healthier with  her she struggles with family and or coworkers weight loss sabotage her desired weight loss is 27 lbs she has been heavy most of  her life she started gaining weight in college her heaviest weight ever was 217 lbs she has significant food cravings issues  she snacks frequently in the evenings she skips meals frequently she is frequently drinking liquids with calories she frequently makes poor food choices she frequently eats larger portions than normal  she struggles with emotional eating    Fatigue Jillian [name] feels her energy is lower than it should be. This has worsened with weight gain and has not worsened recently. Jillian [name] admits to daytime somnolence and  admits to waking up still tired and refreshed. Patient is at risk for  obstructive sleep apnea. Patent has a history of symptoms of daytime fatigue. Patient generally gets 5 hours of sleep per night, and states they generally have generally restful sleep. Snoring is present. Apneic episodes are not present. Epworth Sleepiness Score is 8.  Dyspnea on exertion Jillian [name] notes increasing shortness of breath with exercising and seems to be worsening over time with weight gain. She notes getting out of breath sooner with activity than she used to. This has not gotten worse recently. Jillian [name] denies orthopnea.  Vitamin D Deficiency Jillian [name] has a diagnosis of vitamin D deficiency. She is on OTC Vit D 5,000 IU daily. She notes fatigue and denies nausea, vomiting or muscle weakness.  Hyperglycemia Jillian [name] has a history of some elevated blood glucose readings without a diagnosis of diabetes. She admits to polyphagia, and no recent A1c.  Vitamin B12 Deficiency Jillian [name] has a diagnosis of B12 insufficiency, she is on OTC B12 and still notes fatigue. She is not a vegetarian and does not have a previous diagnosis of pernicious anemia. She does not have a history of weight loss surgery.   Depression Screen Jillian [name]'s Food and Mood (modified PHQ-9) score was  Depression screen PHQ 2/9 06/12/2018  Decreased Interest 1  Down, Depressed, Hopeless 1  PHQ - 2 Score 2  Altered sleeping 1  Tired, decreased energy 1  Change in appetite 2  Feeling bad or failure about yourself  2  Trouble concentrating 1  Moving slowly or fidgety/restless 0  Suicidal thoughts 0  PHQ-9 Score 9  Difficult doing work/chores Not difficult at all   At risk for cardiovascular  disease Jillian [name] is at a higher than average risk for cardiovascular disease due to obesity. She currently denies any chest pain.  ALLERGIES: No Known Allergies  MEDICATIONS: Current Outpatient Medications on File Prior to Visit  Medication Sig Dispense Refill  . Ascorbic Acid (VITAMIN C) 1000 MG tablet Take 1,000 mg by mouth  daily.    Marland Kitchen aspirin EC 81 MG tablet Take 81 mg by mouth daily.    Marland Kitchen CALCIUM-MAGNESIUM-ZINC PO Take 1 capsule by mouth daily.    . cetirizine (ZYRTEC) 10 MG tablet Take 10 mg by mouth daily.    . Coenzyme Q10 (CO Q 10) 100 MG CAPS Take 1 capsule by mouth daily.    . Cyanocobalamin (VITAMIN B 12 PO) Take 2 capsules by mouth daily.    Marland Kitchen desloratadine-pseudoephedrine (CLARINEX-D 12-HOUR) 2.5-120 MG 12 hr tablet Take 1 tablet by mouth daily.    Marland Kitchen estradiol (VIVELLE-DOT) 0.025 MG/24HR PLACE 1 PATCH ONTO THE SKIN 2 TIMES A WEEK. 24 patch 1  . Fluocinonide Emulsified Base 0.05 % CREA APPLY  CREAM EXTERNALLY TWICE DAILY TO  AFFECTED  AREA 60 g 0  . fluticasone (FLONASE) 50 MCG/ACT nasal spray Place 1 spray into both nostrils daily as needed. 48 g 2  . GARLIC PO Take 683 mg by mouth daily.    Marland Kitchen ibuprofen (ADVIL,MOTRIN) 800 MG tablet Take 1 tablet (800 mg total) by mouth every 8 (eight) hours as needed. 30 tablet 0  . lidocaine (LIDODERM) 5 % Place 1 patch onto the skin every 12 (twelve) hours. Remove & Discard patch within 12 hours. 10 patch 0  . Multiple Vitamins-Minerals (ALIVE ONCE DAILY WOMENS 50+) TABS Take 1 tablet by mouth daily.    . Omega-3 1000 MG CAPS Take 1 capsule by mouth daily.    . Probiotic Product (PROBIOTIC-10) CAPS Take 1 capsule by mouth daily.    . Turmeric Curcumin 500 MG CAPS Take 1 capsule by mouth daily.    . Vitamin D, Cholecalciferol, 1000 units CAPS Take 1 capsule by mouth daily.    . vitamin E 400 UNIT capsule Take 400 Units by mouth daily.     No current facility-administered medications on file prior to visit.     PAST MEDICAL HISTORY: Past Medical History:  Diagnosis Date  . Allergic rhinitis   . Allergy   . Arthritis   . Arthritis of low back   . Back pain   . Cataract   . Cataracts, bilateral   . Constipation   . Dry skin    on hands  . Fibroid uterus   . Hyperlipidemia   . Joint pain   . Osteoarthritis of right shoulder   . Ringing in ears   .  Sciatica   . Vitamin D deficiency     PAST SURGICAL HISTORY: Past Surgical History:  Procedure Laterality Date  . ABDOMINAL HYSTERECTOMY    . broken ankle    . CARPAL TUNNEL RELEASE Bilateral   . SHOULDER SURGERY Right     SOCIAL HISTORY: Social History   Tobacco Use  . Smoking status: Former Smoker    Packs/day: 1.00    Years: 29.00    Pack years: 29.00    Types: Cigarettes  . Smokeless tobacco: Never Used  . Tobacco comment: 30 years  Substance Use Topics  . Alcohol use: No    Frequency: Never  . Drug use: No    FAMILY HISTORY: Family History  Problem Relation Age of Onset  . Colon cancer  Sister   . Colon polyps Sister   . Diabetes Mother        father and 9 siblings  . Kidney disease Mother        renal failure  . Hypertension Mother   . Hyperlipidemia Mother   . Stroke Mother   . Depression Mother   . Anxiety disorder Mother   . Obesity Mother   . Heart disease Father        paternal grandmother  . Obesity Father   . Diabetes Father   . Hypertension Father   . Hyperlipidemia Father   . Eating disorder Father   . Esophageal cancer Neg Hx   . Rectal cancer Neg Hx   . Stomach cancer Neg Hx     ROS: Review of Systems  Constitutional: Positive for malaise/fatigue. Negative for weight loss.       + Trouble sleeping  HENT: Positive for tinnitus.        + Stuffiness + Dry mouth  Eyes: Positive for redness.       + Vision changes + Wear glasses or contacts + Floaters  Respiratory: Positive for shortness of breath (with exertion).   Cardiovascular: Negative for chest pain and orthopnea.  Gastrointestinal: Positive for constipation. Negative for nausea and vomiting.  Musculoskeletal: Positive for back pain.       Negative muscle weakness + Muscle or joint pain + Muscle stiffness  Skin:       + Dryness + Hair or nail changes  Endo/Heme/Allergies:       Negative hypoglycemia Positive polyphagia  Psychiatric/Behavioral:       + Stress     PHYSICAL EXAM: Blood pressure 135/84, pulse 80, height 5\' 6"  (1.676 m), weight 212 lb (96.2 kg), SpO2 99 %. Body mass index is 34.22 kg/m. Physical Exam  Constitutional: She is oriented to person, place, and time. She appears well-developed and well-nourished.  HENT:  Head: Normocephalic and atraumatic.  Nose: Nose normal.  Eyes: EOM are normal.  Neck: Normal range of motion. Neck supple. No thyromegaly present.  Cardiovascular: Normal rate and regular rhythm.  Pulmonary/Chest: Effort normal. No respiratory distress.  Abdominal: Soft. There is no tenderness.  + Obesity  Musculoskeletal:  Range of Motion normal in all 4 extremities Trace edema noted in bilateral lower extremities  Neurological: She is alert and oriented to person, place, and time. Coordination normal.  Skin: Skin is warm and dry.  Psychiatric: She has a normal mood and affect. Her behavior is normal.  Vitals reviewed.   RECENT LABS AND TESTS: BMET    Component Value Date/Time   NA 138 06/12/2018 1024   K 4.2 06/12/2018 1024   CL 102 06/12/2018 1024   CO2 24 06/12/2018 1024   GLUCOSE 82 06/12/2018 1024   GLUCOSE 95 05/29/2018 0920   BUN 10 06/12/2018 1024   CREATININE 0.70 06/12/2018 1024   CALCIUM 9.4 06/12/2018 1024   GFRNONAA 93 06/12/2018 1024   GFRAA 107 06/12/2018 1024   Lab Results  Component Value Date   HGBA1C 5.6 06/12/2018   Lab Results  Component Value Date   INSULIN 8.5 06/12/2018   CBC    Component Value Date/Time   WBC 4.6 06/12/2018 1024   WBC 3.9 (L) 05/21/2017 0920   RBC 4.93 06/12/2018 1024   RBC 4.63 05/21/2017 0920   HGB 13.5 06/12/2018 1024   HCT 42.0 06/12/2018 1024   PLT 232.0 05/21/2017 0920   MCV 85 06/12/2018 1024   MCH 27.4  06/12/2018 1024   MCHC 32.1 06/12/2018 1024   MCHC 33.1 05/21/2017 0920   RDW 13.5 06/12/2018 1024   LYMPHSABS 1.8 06/12/2018 1024   MONOABS 0.3 05/21/2017 0920   EOSABS 0.1 06/12/2018 1024   BASOSABS 0.0 06/12/2018 1024    Iron/TIBC/Ferritin/ %Sat No results found for: IRON, TIBC, FERRITIN, IRONPCTSAT Lipid Panel     Component Value Date/Time   CHOL 232 (H) 05/29/2018 0920   TRIG 56.0 05/29/2018 0920   HDL 86.80 05/29/2018 0920   CHOLHDL 3 05/29/2018 0920   VLDL 11.2 05/29/2018 0920   LDLCALC 134 (H) 05/29/2018 0920   LDLDIRECT 158.6 08/18/2013 0803   Hepatic Function Panel     Component Value Date/Time   PROT 7.4 06/12/2018 1024   ALBUMIN 4.4 06/12/2018 1024   AST 26 06/12/2018 1024   ALT 21 06/12/2018 1024   ALKPHOS 79 06/12/2018 1024   BILITOT 0.2 06/12/2018 1024      Component Value Date/Time   TSH 1.790 06/12/2018 1024   TSH 2.19 05/21/2017 0920   TSH 1.28 01/10/2016 1030    ECG  shows NSR with a rate of 84 BPM INDIRECT CALORIMETER done today shows a VO2 of 283 and a REE of 1971.  Her calculated basal metabolic rate is 2841 thus her basal metabolic rate is better than expected.    ASSESSMENT AND PLAN: Other fatigue - Plan: EKG 12-Lead, CBC With Differential, Folate, T3, T4, free, TSH  Shortness of breath on exertion - Plan: CANCELED: Lipid Panel With LDL/HDL Ratio  Vitamin D deficiency - Plan: VITAMIN D 25 Hydroxy (Vit-D Deficiency, Fractures)  Hyperglycemia - Plan: Comprehensive metabolic panel, Hemoglobin A1c, Insulin, random  B12 nutritional deficiency - Plan: Vitamin B12  At risk for heart disease  Depression screening  Class 1 obesity with serious comorbidity and body mass index (BMI) of 34.0 to 34.9 in adult, unspecified obesity type  PLAN:  Fatigue Zyiah was informed that her fatigue may be related to obesity, depression or many other causes. Labs will be ordered, and in the meanwhile Jillian [name] has agreed to work on diet, exercise and weight loss to help with fatigue. Proper sleep hygiene was discussed including the need for 7-8 hours of quality sleep each night. A sleep study was not ordered based on symptoms and Epworth score.  Dyspnea on exertion Aviendha's  shortness of breath appears to be obesity related and exercise induced. She has agreed to work on weight loss and gradually increase exercise to treat her exercise induced shortness of breath. If Jillian [name] follows our instructions and loses weight without improvement of her shortness of breath, we will plan to refer to pulmonology. We will monitor this condition regularly. Jillian [name] agrees to this plan.  Vitamin D Deficiency Malika was informed that low vitamin D levels contributes to fatigue and are associated with obesity, breast, and colon cancer. Daysha agrees to continue taking OTC Vit D and will follow up for routine testing of vitamin D, at least 2-3 times per year. She was informed of the risk of over-replacement of vitamin D and agrees to not increase her dose unless she discusses this with [place] first. We will check labs and Aliyha agrees to follow up with our clinic in 2 weeks.  Hyperglycemia Fasting labs will be obtained today and results with be discussed with Kasarah in 2 weeks at her follow up visit. In the meanwhile Athene was started on a lower simple carbohydrate diet and will work on weight loss efforts.  Vitamin B12 Deficiency  Jillian [name] will start diet and will work on increasing B12 rich foods in her diet. B12 supplementation was not prescribed today. We will check labs and Jillian [name] agrees to follow up with our clinic in 2 weeks.  Depression Screen Jillian [name] had a mildly positive depression screening. Depression is commonly associated with obesity and often results in emotional eating behaviors. We will monitor this closely and work on CBT to help improve the non-hunger eating patterns. Referral to Psychology may be required if no improvement is seen as she continues in our clinic.  Cardiovascular risk counselling Jillian [name] was given extended (15 minutes) coronary artery disease prevention counseling today. She is 62 y.o. female and has risk factors for heart disease including obesity. We  discussed intensive lifestyle modifications today with an emphasis on specific weight loss instructions and strategies. Pt was also informed of the importance of increasing exercise and decreasing saturated fats to help prevent heart disease.  Obesity Jillian [name] is currently in the action stage of change and her goal is to continue with weight loss efforts. I recommend Jillian [name] begin the structured treatment plan as follows:  She has agreed to follow the Category 2 plan Arlis has been instructed to eventually work up to a goal of 150 minutes of combined cardio and strengthening exercise per week for weight loss and overall health benefits. We discussed the following Behavioral Modification Strategies today: increasing lean protein intake, decreasing simple carbohydrates , decrease eating out and dealing with family or coworker sabotage   She was informed of the importance of frequent follow up visits to maximize her success with intensive lifestyle modifications for her multiple health conditions. She was informed we would discuss her lab results at her next visit unless there is a critical issue that needs to be addressed sooner. Nylah agreed to keep her next visit at the agreed upon time to discuss these results.    OBESITY BEHAVIORAL INTERVENTION VISIT  Today's visit was # 1   Starting weight: 212 lbs Starting date: 06/12/18 Today's weight : 212 lbs Today's date: 06/12/2018 Total lbs lost to date: 0    ASK: We discussed the diagnosis of obesity with Lucious Groves today and Atziry agreed to give [place] permission to discuss obesity behavioral modification therapy today.  ASSESS: Mairyn has the diagnosis of obesity and her BMI today is 34.23 Lyana is in the action stage of change   ADVISE: Jayni was educated on the multiple health risks of obesity as well as the benefit of weight loss to improve her health. She was advised of the need for long term treatment and the  importance of lifestyle modifications to improve her current health and to decrease her risk of future health problems.  AGREE: Multiple dietary modification options and treatment options were discussed and  Shavawn agreed to follow the recommendations documented in the above note.  ARRANGE: Ileanna was educated on the importance of frequent visits to treat obesity as outlined per CMS and USPSTF guidelines and agreed to schedule her next follow up appointment today.  Wilhemena Durie, am acting as Location manager for Dennard Nip, MD   I have reviewed the above documentation for accuracy and completeness, and I agree with the above. -Dennard Nip, MD

## 2018-06-18 ENCOUNTER — Encounter: Payer: Self-pay | Admitting: Gastroenterology

## 2018-06-18 ENCOUNTER — Ambulatory Visit (AMBULATORY_SURGERY_CENTER): Admitting: Gastroenterology

## 2018-06-18 VITALS — BP 134/80 | HR 66 | Temp 97.1°F | Resp 11 | Ht 66.0 in | Wt 212.0 lb

## 2018-06-18 DIAGNOSIS — Z800 Family history of malignant neoplasm of digestive organs: Secondary | ICD-10-CM

## 2018-06-18 DIAGNOSIS — Z1211 Encounter for screening for malignant neoplasm of colon: Secondary | ICD-10-CM | POA: Diagnosis not present

## 2018-06-18 DIAGNOSIS — D124 Benign neoplasm of descending colon: Secondary | ICD-10-CM | POA: Diagnosis not present

## 2018-06-18 MED ORDER — SODIUM CHLORIDE 0.9 % IV SOLN: 500.0000 mL | INTRAVENOUS | Status: DC

## 2018-06-18 NOTE — Progress Notes (Signed)
Pt's states no medical or surgical changes since previsit or office visit. 

## 2018-06-18 NOTE — Patient Instructions (Signed)
*   HANDOUT ON HEMORRHOIDS,POLYPS GIVEN*  YOU HAD AN ENDOSCOPIC PROCEDURE TODAY AT [facility]:   Refer to the procedure report that was given to you for any specific questions about what was found during the examination.  If the procedure report does not answer your questions, please call your gastroenterologist to clarify.  If you requested that your care partner not be given the details of your procedure findings, then the procedure report has been included in a sealed envelope for you to review at your convenience later.  YOU SHOULD EXPECT: Some feelings of bloating in the abdomen. Passage of more gas than usual.  Walking can help get rid of the air that was put into your GI tract during the procedure and reduce the bloating. If you had a lower endoscopy (such as a colonoscopy or flexible sigmoidoscopy) you may notice spotting of blood in your stool or on the toilet paper. If you underwent a bowel prep for your procedure, you may not have a normal bowel movement for a few days.  Please Note:  You might notice some irritation and congestion in your nose or some drainage.  This is from the oxygen used during your procedure.  There is no need for concern and it should clear up in a day or so.  SYMPTOMS TO REPORT IMMEDIATELY:   Following lower endoscopy (colonoscopy or flexible sigmoidoscopy):  Excessive amounts of blood in the stool  Significant tenderness or worsening of abdominal pains  Swelling of the abdomen that is new, acute  Fever of 100F or higher   For urgent or emergent issues, a gastroenterologist can be reached at any hour by calling [phone].   DIET:  We do recommend a small meal at first, but then you may proceed to your regular diet.  Drink plenty of fluids but you should avoid alcoholic beverages for 24 hours.  ACTIVITY:  You should plan to take it easy for the rest of today and you should NOT DRIVE or use heavy machinery until tomorrow (because of the  sedation medicines used during the test).    FOLLOW UP: Our staff will call the number listed on your records the next business day following your procedure to check on you and address any questions or concerns that you may have regarding the information given to you following your procedure. If we do not reach you, we will leave a message.  However, if you are feeling well and you are not experiencing any problems, there is no need to return our call.  We will assume that you have returned to your regular daily activities without incident.  If any biopsies were taken you will be contacted by phone or by letter within the next 1-3 weeks.  Please call us at [phone] if you have not heard about the biopsies in 3 weeks.    SIGNATURES/CONFIDENTIALITY: You and/or your care partner have signed paperwork which will be entered into your electronic medical record.  These signatures attest to the fact that that the information above on your After Visit Summary has been reviewed and is understood.  Full responsibility of the confidentiality of this discharge information lies with you and/or your care-partner.

## 2018-06-18 NOTE — Progress Notes (Signed)
Alert and oriented x3, pleased with MAC, report to RN  

## 2018-06-18 NOTE — Op Note (Signed)
[place] Patient Name: Jillian [name] Procedure Date: 06/18/2018 11:29 AM MRN: [id number] Endoscopist: Ladene Artist , MD Age: 62 Referring MD:  Date of Birth: 08/19/1956 Gender: Female Account #: [id number] Procedure:                Colonoscopy Indications:              Screening in patient at increased risk: Family                            history of 1st-degree relative with colorectal                            cancer before age 73 years Medicines:                Monitored Anesthesia Care Procedure:                Pre-Anesthesia Assessment:                           - Prior to the procedure, a History and Physical                            was performed, and patient medications and                            allergies were reviewed. The patient's tolerance of                            previous anesthesia was also reviewed. The risks                            and benefits of the procedure and the sedation                            options and risks were discussed with the patient.                            All questions were answered, and informed consent                            was obtained. Prior Anticoagulants: The patient has                            taken no previous anticoagulant or antiplatelet                            agents. ASA Grade Assessment: II - A patient with                            mild systemic disease. After reviewing the risks                            and benefits, the patient was deemed in  satisfactory condition to undergo the procedure.                           After obtaining informed consent, the colonoscope                            was passed under direct vision. Throughout the                            procedure, the patient's blood pressure, pulse, and                            oxygen saturations were monitored continuously. The                            Colonoscope was introduced  through the anus and                            advanced to the the cecum, identified by                            appendiceal orifice and ileocecal valve. The                            ileocecal valve, appendiceal orifice, and rectum                            were photographed. The quality of the bowel                            preparation was excellent. The colonoscopy was                            performed without difficulty. The patient tolerated                            the procedure well. Scope In: 11:29:52 AM Scope Out: 11:54:53 AM Scope Withdrawal Time: 0 hours 20 minutes 31 seconds  Total Procedure Duration: 0 hours 25 minutes 1 second  Findings:                 The perianal and digital rectal examinations were                            normal.                           Two sessile polyps were found in the descending                            colon. The polyps were 5 to 6 mm in size. These                            polyps were removed with a cold snare. Resection  and retrieval were complete.                           Internal hemorrhoids were found during                            retroflexion. The hemorrhoids were small and Grade                            I (internal hemorrhoids that do not prolapse).                           The exam was otherwise without abnormality on                            direct and retroflexion views. Complications:            No immediate complications. Estimated blood loss:                            None. Estimated Blood Loss:     Estimated blood loss: none. Impression:               - Two 5 to 6 mm polyps in the descending colon,                            removed with a cold snare. Resected and retrieved.                           - Internal hemorrhoids.                           - The examination was otherwise normal on direct                            and retroflexion views. Recommendation:            - Repeat colonoscopy in 5 years for surveillance.                           - Patient has a contact number available for                            emergencies. The signs and symptoms of potential                            delayed complications were discussed with the                            patient. Return to normal activities tomorrow.                            Written discharge instructions were provided to the                            patient.                           -  Resume previous diet.                           - Continue present medications.                           - Await pathology results. Ladene Artist, MD 06/18/2018 11:58:35 AM This report has been signed electronically.

## 2018-06-18 NOTE — Progress Notes (Signed)
Called to room to assist during endoscopic procedure.  Patient ID and intended procedure confirmed with present staff. Received instructions for my participation in the procedure from the performing physician.  

## 2018-06-19 ENCOUNTER — Telehealth: Payer: Self-pay | Admitting: *Deleted

## 2018-06-19 NOTE — Telephone Encounter (Signed)
  Follow up Call-  Call back number 06/18/2018  Post procedure Call Back phone  # [phone]  Permission to leave phone message Yes  Some recent data might be hidden     Patient questions:  Do you have a fever, pain , or abdominal swelling? No. Pain Score  0 *  Have you tolerated food without any problems? Yes.    Have you been able to return to your normal activities? Yes.    Do you have any questions about your discharge instructions: Diet   No. Medications  No. Follow up visit  No.  Do you have questions or concerns about your Care? No.  Actions: * If pain score is 4 or above: No action needed, pain <4.

## 2018-06-19 NOTE — Telephone Encounter (Signed)
  Follow up Call-  Call back number 06/18/2018  Post procedure Call Back phone  # [phone]  Permission to leave phone message Yes  Some recent data might be hidden     Patient questions:  Message left to call us if necessary.

## 2018-06-26 ENCOUNTER — Ambulatory Visit (INDEPENDENT_AMBULATORY_CARE_PROVIDER_SITE_OTHER): Admitting: Family Medicine

## 2018-06-26 VITALS — BP 125/83 | HR 80 | Temp 97.8°F | Ht 66.0 in | Wt 206.0 lb

## 2018-06-26 DIAGNOSIS — R7303 Prediabetes: Secondary | ICD-10-CM

## 2018-06-26 DIAGNOSIS — E559 Vitamin D deficiency, unspecified: Secondary | ICD-10-CM

## 2018-06-26 DIAGNOSIS — E669 Obesity, unspecified: Secondary | ICD-10-CM

## 2018-06-26 DIAGNOSIS — Z6833 Body mass index (BMI) 33.0-33.9, adult: Secondary | ICD-10-CM

## 2018-06-26 MED ORDER — VITAMIN D (ERGOCALCIFEROL) 1.25 MG (50000 UNIT) PO CAPS: 50000.0000 [IU] | ORAL_CAPSULE | ORAL | 0 refills | Status: DC

## 2018-06-27 ENCOUNTER — Encounter: Payer: Self-pay | Admitting: Gastroenterology

## 2018-06-27 ENCOUNTER — Telehealth: Payer: Self-pay | Admitting: *Deleted

## 2018-06-27 DIAGNOSIS — J309 Allergic rhinitis, unspecified: Secondary | ICD-10-CM

## 2018-06-27 MED ORDER — CETIRIZINE HCL 10 MG PO TABS: 10.0000 mg | ORAL_TABLET | Freq: Every day | ORAL | 1 refills | Status: DC

## 2018-06-27 NOTE — Telephone Encounter (Signed)
Noted, Rx sent to pharmacy. 

## 2018-06-27 NOTE — Telephone Encounter (Signed)
Spoke to pt who states that she was advised to switch from claritin d to zyrtec. She received a coupon for the zyrtec cost to be $3, but she is needing a Rx sent to [facility].

## 2018-06-30 NOTE — Progress Notes (Signed)
Office: [phone]  /  Fax: [phone]   HPI:   Chief Complaint: OBESITY Jillian [name] is here to discuss her progress with her obesity treatment plan. She is on the Category 2 plan and is following her eating plan approximately 99 % of the time. She states she is walking 5,000-7,000 steps daily and at the gym for 90 minutes 1-2 times per week. Jillian [name] did very well with weight loss on her Category 2 plan. Her hunger is controlled and she is happy with her progress.  Her weight is 206 lb (93.4 kg) today and has had a weight loss of 6 pounds over a period of 2 weeks since her last visit. She has lost 6 lbs since starting treatment with [place].  Vitamin D Deficiency Jillian [name] has a diagnosis of vitamin D deficiency. She is on OTC Vit D at 5,000 IU daily, but level is not at goal. She denies nausea, vomiting or muscle weakness.  Pre-Diabetes Jillian [name] has a diagnosis of pre-diabetes based on her slightly elevated Hgb A1c at 5.6 and fasting insulin >5 and was informed this puts her at greater risk of developing diabetes. She notes polyphagia has improved with decreasing simple carbohydrates. She is not taking metformin currently and continues to work on diet and exercise to decrease risk of diabetes. She denies hypoglycemia.  ALLERGIES: No Known Allergies  MEDICATIONS: Current Outpatient Medications on File Prior to Visit  Medication Sig Dispense Refill  . Ascorbic Acid (VITAMIN C) 1000 MG tablet Take 1,000 mg by mouth daily.    Marland Kitchen aspirin EC 81 MG tablet Take 81 mg by mouth daily.    Marland Kitchen CALCIUM-MAGNESIUM-ZINC PO Take 1 capsule by mouth daily.    . Coenzyme Q10 (CO Q 10) 100 MG CAPS Take 1 capsule by mouth daily.    . Cyanocobalamin (VITAMIN B 12 PO) Take 1 capsule by mouth daily.     Marland Kitchen desloratadine-pseudoephedrine (CLARINEX-D 12-HOUR) 2.5-120 MG 12 hr tablet Take 1 tablet by mouth daily.    Marland Kitchen estradiol (VIVELLE-DOT) 0.025 MG/24HR PLACE 1 PATCH ONTO THE SKIN 2 TIMES A WEEK. 24 patch 1  .  Fluocinonide Emulsified Base 0.05 % CREA APPLY  CREAM EXTERNALLY TWICE DAILY TO  AFFECTED  AREA 60 g 0  . fluticasone (FLONASE) 50 MCG/ACT nasal spray Place 1 spray into both nostrils daily as needed. 48 g 2  . GARLIC PO Take 128 mg by mouth daily.    Marland Kitchen ibuprofen (ADVIL,MOTRIN) 800 MG tablet Take 1 tablet (800 mg total) by mouth every 8 (eight) hours as needed. 30 tablet 0  . lidocaine (LIDODERM) 5 % Place 1 patch onto the skin every 12 (twelve) hours. Remove & Discard patch within 12 hours. 10 patch 0  . Multiple Vitamins-Minerals (ALIVE ONCE DAILY WOMENS 50+) TABS Take 1 tablet by mouth daily.    . Omega-3 1000 MG CAPS Take 1 capsule by mouth daily.    . Probiotic Product (PROBIOTIC-10) CAPS Take 1 capsule by mouth daily.    . Turmeric Curcumin 500 MG CAPS Take 1 capsule by mouth daily.    . Vitamin D, Cholecalciferol, 1000 units CAPS Take 1 capsule by mouth daily.    . vitamin E 400 UNIT capsule Take 400 Units by mouth daily.     No current facility-administered medications on file prior to visit.     PAST MEDICAL HISTORY: Past Medical History:  Diagnosis Date  . Allergic rhinitis   . Allergy   . Arthritis   . Arthritis of low  back   . Back pain   . Cataract   . Cataracts, bilateral   . Constipation   . Dry skin    on hands  . Fibroid uterus   . Hyperlipidemia   . Joint pain   . Osteoarthritis of right shoulder   . Ringing in ears   . Sciatica    sciatica  . Vitamin D deficiency     PAST SURGICAL HISTORY: Past Surgical History:  Procedure Laterality Date  . ABDOMINAL HYSTERECTOMY    . broken ankle    . CARPAL TUNNEL RELEASE Bilateral   . SHOULDER SURGERY Right     SOCIAL HISTORY: Social History   Tobacco Use  . Smoking status: Former Smoker    Packs/day: 1.00    Years: 29.00    Pack years: 29.00    Types: Cigarettes  . Smokeless tobacco: Never Used  . Tobacco comment: 30 years  Substance Use Topics  . Alcohol use: No    Frequency: Never  . Drug use: No      FAMILY HISTORY: Family History  Problem Relation Age of Onset  . Colon cancer Sister   . Colon polyps Sister   . Diabetes Mother        father and 9 siblings  . Kidney disease Mother        renal failure  . Hypertension Mother   . Hyperlipidemia Mother   . Stroke Mother   . Depression Mother   . Anxiety disorder Mother   . Obesity Mother   . Heart disease Father        paternal grandmother  . Obesity Father   . Diabetes Father   . Hypertension Father   . Hyperlipidemia Father   . Eating disorder Father   . Esophageal cancer Neg Hx   . Rectal cancer Neg Hx   . Stomach cancer Neg Hx     ROS: Review of Systems  Constitutional: Positive for weight loss.  Gastrointestinal: Negative for nausea and vomiting.  Musculoskeletal:       Negative muscle weakness  Endo/Heme/Allergies:       Positive polyphagia Negative hypoglycemia    PHYSICAL EXAM: Blood pressure 125/83, pulse 80, temperature 97.8 F (36.6 C), temperature source Oral, height 5\' 6"  (1.676 m), weight 206 lb (93.4 kg), SpO2 100 %. Body mass index is 33.25 kg/m. Physical Exam  Constitutional: She is oriented to person, place, and time. She appears well-developed and well-nourished.  Cardiovascular: Normal rate.  Pulmonary/Chest: Effort normal.  Musculoskeletal: Normal range of motion.  Neurological: She is oriented to person, place, and time.  Skin: Skin is warm and dry.  Psychiatric: She has a normal mood and affect. Her behavior is normal.  Vitals reviewed.   RECENT LABS AND TESTS: BMET    Component Value Date/Time   NA 138 06/12/2018 1024   K 4.2 06/12/2018 1024   CL 102 06/12/2018 1024   CO2 24 06/12/2018 1024   GLUCOSE 82 06/12/2018 1024   GLUCOSE 95 05/29/2018 0920   BUN 10 06/12/2018 1024   CREATININE 0.70 06/12/2018 1024   CALCIUM 9.4 06/12/2018 1024   GFRNONAA 93 06/12/2018 1024   GFRAA 107 06/12/2018 1024   Lab Results  Component Value Date   HGBA1C 5.6 06/12/2018   Lab Results   Component Value Date   INSULIN 8.5 06/12/2018   CBC    Component Value Date/Time   WBC 4.6 06/12/2018 1024   WBC 3.9 (L) 05/21/2017 0920   RBC 4.93  06/12/2018 1024   RBC 4.63 05/21/2017 0920   HGB 13.5 06/12/2018 1024   HCT 42.0 06/12/2018 1024   PLT 232.0 05/21/2017 0920   MCV 85 06/12/2018 1024   MCH 27.4 06/12/2018 1024   MCHC 32.1 06/12/2018 1024   MCHC 33.1 05/21/2017 0920   RDW 13.5 06/12/2018 1024   LYMPHSABS 1.8 06/12/2018 1024   MONOABS 0.3 05/21/2017 0920   EOSABS 0.1 06/12/2018 1024   BASOSABS 0.0 06/12/2018 1024   Iron/TIBC/Ferritin/ %Sat No results found for: IRON, TIBC, FERRITIN, IRONPCTSAT Lipid Panel     Component Value Date/Time   CHOL 232 (H) 05/29/2018 0920   TRIG 56.0 05/29/2018 0920   HDL 86.80 05/29/2018 0920   CHOLHDL 3 05/29/2018 0920   VLDL 11.2 05/29/2018 0920   LDLCALC 134 (H) 05/29/2018 0920   LDLDIRECT 158.6 08/18/2013 0803   Hepatic Function Panel     Component Value Date/Time   PROT 7.4 06/12/2018 1024   ALBUMIN 4.4 06/12/2018 1024   AST 26 06/12/2018 1024   ALT 21 06/12/2018 1024   ALKPHOS 79 06/12/2018 1024   BILITOT 0.2 06/12/2018 1024      Component Value Date/Time   TSH 1.790 06/12/2018 1024   TSH 2.19 05/21/2017 0920   TSH 1.28 01/10/2016 1030  Results for DEVAEH, AMADI (MRN [id number]) as of 06/30/2018 07:20  Ref. Range 06/12/2018 10:24  Vitamin D, 25-Hydroxy Latest Ref Range: 30.0 - 100.0 ng/mL 36.1    ASSESSMENT AND PLAN: Vitamin D deficiency - Plan: Vitamin D, Ergocalciferol, (DRISDOL) 1.25 MG (50000 UT) CAPS capsule  Prediabetes  Class 1 obesity with serious comorbidity and body mass index (BMI) of 33.0 to 33.9 in adult, unspecified obesity type  PLAN:  Vitamin D Deficiency Jillian [name] was informed that low vitamin D levels contributes to fatigue and are associated with obesity, breast, and colon cancer. Jillian [name] agrees to start prescription Vit D @50 ,000 IU every week #4 with no refills, and she is to  discontinue OTC Vit D for now. She will follow up for routine testing of vitamin D, at least 2-3 times per year. She was informed of the risk of over-replacement of vitamin D and agrees to not increase her dose unless she discusses this with [place] first. We will recheck labs in 3 months. Jillian [name] agrees to follow up with our clinic in 2 weeks with Jillian [name], our registered dietitian and in 4 weeks with myself.  Pre-Diabetes Jillian [name] will continue to work on weight loss, exercise, and decreasing simple carbohydrates in her diet to help decrease the risk of diabetes. We dicussed metformin including benefits and risks. She was informed that eating too many simple carbohydrates or too many calories at one sitting increases the likelihood of GI side effects. Jillian [name] declined metformin for now and a prescription was not written today. Jillian [name] agrees to follow up with our clinic in 2 weeks with Jillian [name], our registered dietitian and in 4 weeks with myself as directed to monitor her progress.  Obesity Jillian [name] is currently in the action stage of change. As such, her goal is to continue with weight loss efforts She has agreed to follow the Category 2 plan Jillian [name] has been instructed to work up to a goal of 150 minutes of combined cardio and strengthening exercise per week for weight loss and overall health benefits. We discussed the following Behavioral Modification Strategies today: increasing lean protein intake, decreasing simple carbohydrates, and work on meal planning and easy cooking plans   Jillian [name] has agreed to follow up  with our clinic in 2 weeks with Jillian [name], our registered dietitian and in 4 weeks with myself. She was informed of the importance of frequent follow up visits to maximize her success with intensive lifestyle modifications for her multiple health conditions.   OBESITY BEHAVIORAL INTERVENTION VISIT  Today's visit was # 2   Starting weight: 212 lbs Starting date: 06/12/18 Today's weight :  206 lbs Today's date: 06/26/2018 Total lbs lost to date: 6    ASK: We discussed the diagnosis of obesity with Jillian [name] today and Jillian [name] agreed to give [place] permission to discuss obesity behavioral modification therapy today.  ASSESS: Jillian [name] has the diagnosis of obesity and her BMI today is 33.27 Jillian [name] is in the action stage of change   ADVISE: Jillian [name] was educated on the multiple health risks of obesity as well as the benefit of weight loss to improve her health. She was advised of the need for long term treatment and the importance of lifestyle modifications to improve her current health and to decrease her risk of future health problems.  AGREE: Multiple dietary modification options and treatment options were discussed and  Jillian [name] agreed to follow the recommendations documented in the above note.  ARRANGE: Jillian [name] was educated on the importance of frequent visits to treat obesity as outlined per CMS and USPSTF guidelines and agreed to schedule her next follow up appointment today.  I, Trixie Dredge, am acting as transcriptionist for Dennard Nip, MD  I have reviewed the above documentation for accuracy and completeness, and I agree with the above. -Dennard Nip, MD

## 2018-07-10 ENCOUNTER — Ambulatory Visit (INDEPENDENT_AMBULATORY_CARE_PROVIDER_SITE_OTHER): Admitting: Dietician

## 2018-07-10 VITALS — Ht 66.0 in | Wt 204.0 lb

## 2018-07-10 DIAGNOSIS — R7303 Prediabetes: Secondary | ICD-10-CM

## 2018-07-10 DIAGNOSIS — E669 Obesity, unspecified: Secondary | ICD-10-CM | POA: Diagnosis not present

## 2018-07-10 DIAGNOSIS — Z9189 Other specified personal risk factors, not elsewhere classified: Secondary | ICD-10-CM | POA: Diagnosis not present

## 2018-07-10 DIAGNOSIS — Z6834 Body mass index (BMI) 34.0-34.9, adult: Secondary | ICD-10-CM

## 2018-07-14 NOTE — Progress Notes (Signed)
  Office: [phone]  /  Fax: [phone]     Jillian [name] has a diagnosis of prediabetes based on her elevated HgA1c and was informed this puts her at greater risk of developing diabetes.She is here today for diabetes risk nutrition counseling which includes her obesity treatment plan.  Her weight today is 204 lbs, a 2 lb weight loss since her last visit and has had a weight loss of 8 lbs since beginning treatment with [place]. She states she has been following her Category 2 meal plan approximately 95% of the time and continues to be extremely satisfied with her on going progress. She denies any polyphagia/polydypsia. She also states he is exercising 2 days per week for 30 minutes.   Reviewed food nutrients ie protein, fats, simple and complex carbohydrates and how these affect insulin response and weight loss. Discussed holiday/Thanksgiving eating strategies in detail today.  Focus on portion control,  avoiding simple carbohydrates and increasing lean protein sources for ongoing wt loss efforts.  Jillian [name] is on the following meal plan: Category 2 Her meal plan was individualized for maximum benefit.  Also discussed at length the following behavioral modifications to help maximize success: increasing lean protein intake, decreasing simple carbohydrates, increasing vegetables, increase water intake, holiday and celebration  eating strategies.    Jillian [name] has been instructed to work up to a goal of 150 minutes of combined cardio and strengthening exercise per week for weight loss and overall health benefits.     OBESITY BEHAVIORAL INTERVENTION VISIT  Today's visit was # 3   Starting weight: 212 lbs Starting date: 06/12/18 Today's weight : Weight: 204 lb (92.5 kg)  Today's date: 07/10/18 Total lbs lost to date: 8 lbs At least 15 minutes were spent on discussing the following behavioral intervention visit.   ASK: We discussed the diagnosis of obesity with Jillian [name] today and  Jillian [name] agreed to give [place] permission to discuss obesity behavioral modification therapy today.  ASSESS: Jillian [name] has the diagnosis of obesity and her BMI today is 32.9 Jillian [name] is in the action stage of change   ADVISE: Jillian [name] was educated on the multiple health risks of obesity as well as the benefit of weight loss to improve her health. She was advised of the need for long term treatment and the importance of lifestyle modifications to improve her current health and to decrease her risk of future health problems.  AGREE: Multiple dietary modification options and treatment options were discussed and  Jillian [name] agreed to follow the recommendations documented in the above note.  ARRANGE: Jillian [name] was educated on the importance of frequent visits to treat obesity as outlined per CMS and USPSTF guidelines and agreed to schedule her next follow up appointment today.

## 2018-07-24 ENCOUNTER — Encounter (INDEPENDENT_AMBULATORY_CARE_PROVIDER_SITE_OTHER): Payer: Self-pay | Admitting: Family Medicine

## 2018-07-24 ENCOUNTER — Ambulatory Visit (INDEPENDENT_AMBULATORY_CARE_PROVIDER_SITE_OTHER): Admitting: Family Medicine

## 2018-07-24 VITALS — BP 139/91 | HR 79 | Temp 97.8°F | Ht 66.0 in | Wt 203.0 lb

## 2018-07-24 DIAGNOSIS — E559 Vitamin D deficiency, unspecified: Secondary | ICD-10-CM | POA: Diagnosis not present

## 2018-07-24 DIAGNOSIS — Z6832 Body mass index (BMI) 32.0-32.9, adult: Secondary | ICD-10-CM

## 2018-07-24 DIAGNOSIS — E669 Obesity, unspecified: Secondary | ICD-10-CM | POA: Diagnosis not present

## 2018-07-24 MED ORDER — VITAMIN D (ERGOCALCIFEROL) 1.25 MG (50000 UNIT) PO CAPS: 50000.0000 [IU] | ORAL_CAPSULE | ORAL | 0 refills | Status: DC

## 2018-07-28 NOTE — Progress Notes (Signed)
Office: [phone]  /  Fax: [phone]   HPI:   Chief Complaint: OBESITY Jillian [name] is here to discuss her progress with her obesity treatment plan. She is on the Category 2 plan and is following her eating plan approximately 85 % of the time. She states she is walking 6,000 to 7,000 steps for 7 days a week or walking on the treadmill, weights, or using the elliptical 90 minutes 2 times per week. [name] continues to do well with her diet even over Thanksgiving. She is being mindful of her food choices and getting good support at home.  Her weight is 203 lb (92.1 kg) today and has had a weight loss of 3 pounds over a period of 4 weeks since her last visit. She has lost 9 lbs since starting treatment with [place].  Vitamin D deficiency Jillian [name] has a diagnosis of vitamin D deficiency. She is currently taking vit D and is stable. She denies nausea, vomiting, or muscle weakness.  ALLERGIES: No Known Allergies  MEDICATIONS: Current Outpatient Medications on File Prior to Visit  Medication Sig Dispense Refill  . Ascorbic Acid (VITAMIN C) 1000 MG tablet Take 1,000 mg by mouth daily.    Marland Kitchen aspirin EC 81 MG tablet Take 81 mg by mouth daily.    Marland Kitchen CALCIUM-MAGNESIUM-ZINC PO Take 1 capsule by mouth daily.    . cetirizine (ZYRTEC) 10 MG tablet Take 1 tablet (10 mg total) by mouth daily. For allergies. 90 tablet 1  . Coenzyme Q10 (CO Q 10) 100 MG CAPS Take 1 capsule by mouth daily.    . Cyanocobalamin (VITAMIN B 12 PO) Take 1 capsule by mouth daily.     Marland Kitchen estradiol (VIVELLE-DOT) 0.025 MG/24HR PLACE 1 PATCH ONTO THE SKIN 2 TIMES A WEEK. 24 patch 1  . Fluocinonide Emulsified Base 0.05 % CREA APPLY  CREAM EXTERNALLY TWICE DAILY TO  AFFECTED  AREA 60 g 0  . fluticasone (FLONASE) 50 MCG/ACT nasal spray Place 1 spray into both nostrils daily as needed. 48 g 2  . GARLIC PO Take 962 mg by mouth daily.    Marland Kitchen ibuprofen (ADVIL,MOTRIN) 800 MG tablet Take 1 tablet (800 mg total) by mouth every 8 (eight) hours as  needed. 30 tablet 0  . lidocaine (LIDODERM) 5 % Place 1 patch onto the skin every 12 (twelve) hours. Remove & Discard patch within 12 hours. 10 patch 0  . Multiple Vitamins-Minerals (ALIVE ONCE DAILY WOMENS 50+) TABS Take 1 tablet by mouth daily.    . Omega-3 1000 MG CAPS Take 1 capsule by mouth daily.    . Probiotic Product (PROBIOTIC-10) CAPS Take 1 capsule by mouth daily.    . Turmeric Curcumin 500 MG CAPS Take 1 capsule by mouth daily.    . vitamin E 400 UNIT capsule Take 400 Units by mouth daily.     No current facility-administered medications on file prior to visit.     PAST MEDICAL HISTORY: Past Medical History:  Diagnosis Date  . Allergic rhinitis   . Allergy   . Arthritis   . Arthritis of low back   . Back pain   . Cataract   . Cataracts, bilateral   . Constipation   . Dry skin    on hands  . Fibroid uterus   . Hyperlipidemia   . Joint pain   . Osteoarthritis of right shoulder   . Ringing in ears   . Sciatica    sciatica  . Vitamin D deficiency  PAST SURGICAL HISTORY: Past Surgical History:  Procedure Laterality Date  . ABDOMINAL HYSTERECTOMY    . broken ankle    . CARPAL TUNNEL RELEASE Bilateral   . SHOULDER SURGERY Right     SOCIAL HISTORY: Social History   Tobacco Use  . Smoking status: Former Smoker    Packs/day: 1.00    Years: 29.00    Pack years: 29.00    Types: Cigarettes  . Smokeless tobacco: Never Used  . Tobacco comment: 30 years  Substance Use Topics  . Alcohol use: No    Frequency: Never  . Drug use: No    FAMILY HISTORY: Family History  Problem Relation Age of Onset  . Colon cancer Sister   . Colon polyps Sister   . Diabetes Mother        father and 9 siblings  . Kidney disease Mother        renal failure  . Hypertension Mother   . Hyperlipidemia Mother   . Stroke Mother   . Depression Mother   . Anxiety disorder Mother   . Obesity Mother   . Heart disease Father        paternal grandmother  . Obesity Father   .  Diabetes Father   . Hypertension Father   . Hyperlipidemia Father   . Eating disorder Father   . Esophageal cancer Neg Hx   . Rectal cancer Neg Hx   . Stomach cancer Neg Hx     ROS: Review of Systems  Constitutional: Positive for weight loss.  Gastrointestinal: Negative for nausea and vomiting.  Musculoskeletal:       Negative for muscle weakness.    PHYSICAL EXAM: Blood pressure (!) 139/91, pulse 79, temperature 97.8 F (36.6 C), temperature source Oral, height 5\' 6"  (1.676 m), weight 203 lb (92.1 kg), SpO2 99 %. Body mass index is 32.77 kg/m. Physical Exam  Constitutional: She is oriented to person, place, and time. She appears well-developed and well-nourished.  Cardiovascular: Normal rate.  Pulmonary/Chest: Effort normal.  Musculoskeletal: Normal range of motion.  Neurological: She is oriented to person, place, and time.  Skin: Skin is warm and dry.  Psychiatric: She has a normal mood and affect. Her behavior is normal.  Vitals reviewed.   RECENT LABS AND TESTS: BMET    Component Value Date/Time   NA 138 06/12/2018 1024   K 4.2 06/12/2018 1024   CL 102 06/12/2018 1024   CO2 24 06/12/2018 1024   GLUCOSE 82 06/12/2018 1024   GLUCOSE 95 05/29/2018 0920   BUN 10 06/12/2018 1024   CREATININE 0.70 06/12/2018 1024   CALCIUM 9.4 06/12/2018 1024   GFRNONAA 93 06/12/2018 1024   GFRAA 107 06/12/2018 1024   Lab Results  Component Value Date   HGBA1C 5.6 06/12/2018   Lab Results  Component Value Date   INSULIN 8.5 06/12/2018   CBC    Component Value Date/Time   WBC 4.6 06/12/2018 1024   WBC 3.9 (L) 05/21/2017 0920   RBC 4.93 06/12/2018 1024   RBC 4.63 05/21/2017 0920   HGB 13.5 06/12/2018 1024   HCT 42.0 06/12/2018 1024   PLT 232.0 05/21/2017 0920   MCV 85 06/12/2018 1024   MCH 27.4 06/12/2018 1024   MCHC 32.1 06/12/2018 1024   MCHC 33.1 05/21/2017 0920   RDW 13.5 06/12/2018 1024   LYMPHSABS 1.8 06/12/2018 1024   MONOABS 0.3 05/21/2017 0920   EOSABS  0.1 06/12/2018 1024   BASOSABS 0.0 06/12/2018 1024   Iron/TIBC/Ferritin/ %  Sat No results found for: IRON, TIBC, FERRITIN, IRONPCTSAT Lipid Panel     Component Value Date/Time   CHOL 232 (H) 05/29/2018 0920   TRIG 56.0 05/29/2018 0920   HDL 86.80 05/29/2018 0920   CHOLHDL 3 05/29/2018 0920   VLDL 11.2 05/29/2018 0920   LDLCALC 134 (H) 05/29/2018 0920   LDLDIRECT 158.6 08/18/2013 0803   Hepatic Function Panel     Component Value Date/Time   PROT 7.4 06/12/2018 1024   ALBUMIN 4.4 06/12/2018 1024   AST 26 06/12/2018 1024   ALT 21 06/12/2018 1024   ALKPHOS 79 06/12/2018 1024   BILITOT 0.2 06/12/2018 1024      Component Value Date/Time   TSH 1.790 06/12/2018 1024   TSH 2.19 05/21/2017 0920   TSH 1.28 01/10/2016 1030   Results for JAYLIANNA, TATLOCK (MRN [id number]) as of 07/28/2018 15:52  Ref. Range 06/12/2018 10:24  Vitamin D, 25-Hydroxy Latest Ref Range: 30.0 - 100.0 ng/mL 36.1   ASSESSMENT AND PLAN: Vitamin D deficiency - Plan: Vitamin D, Ergocalciferol, (DRISDOL) 1.25 MG (50000 UT) CAPS capsule  Class 1 obesity with serious comorbidity and body mass index (BMI) of 32.0 to 32.9 in adult, unspecified obesity type  PLAN:  Vitamin D Deficiency Sameen was informed that low vitamin D levels contributes to fatigue and are associated with obesity, breast, and colon cancer. She agrees to continue to take prescription Vit D @50 ,000 IU every week #4 with no refills and will follow up for routine testing of vitamin D, at least 2-3 times per year. She was informed of the risk of over-replacement of vitamin D and agrees to not increase her dose unless she discusses this with [place] first. We will recheck labs in 1 month. Dione agrees with this plan.  Obesity Lori is currently in the action stage of change. As such, her goal is to continue with weight loss efforts. She has agreed to follow the Category 2 plan. Taylon has been instructed to work up to a goal of 150 minutes of  combined cardio and strengthening exercise per week for weight loss and overall health benefits. We discussed the following Behavioral Modification Strategies today: increasing lean protein intake, decreasing simple carbohydrates, work on meal planning and easy cooking plans, and holiday eating strategies.   Casi has agreed to follow up with our clinic in 2 weeks. She was informed of the importance of frequent follow up visits to maximize her success with intensive lifestyle modifications for her multiple health conditions.   OBESITY BEHAVIORAL INTERVENTION VISIT  Today's visit was # 3   Starting weight: 212 lbs Starting date: 06/12/18 Today's weight : Weight: 203 lb (92.1 kg)  Today's date: 07/24/2018 Total lbs lost to date: 9  ASK: We discussed the diagnosis of obesity with Lucious Groves today and Gradie agreed to give [place] permission to discuss obesity behavioral modification therapy today.  ASSESS: Kiela has the diagnosis of obesity and her BMI today is 32.77. Willadeen is in the action stage of change.   ADVISE: Brandon was educated on the multiple health risks of obesity as well as the benefit of weight loss to improve her health. She was advised of the need for long term treatment and the importance of lifestyle modifications to improve her current health and to decrease her risk of future health problems.  AGREE: Multiple dietary modification options and treatment options were discussed and Jeniffer agreed to follow the recommendations documented in the above note.  ARRANGE: Andreah was educated on  the importance of frequent visits to treat obesity as outlined per CMS and USPSTF guidelines and agreed to schedule her next follow up appointment today.  I, Marcille Blanco, am acting as transcriptionist for Starlyn Skeans, MD  I have reviewed the above documentation for accuracy and completeness, and I agree with the above. -Dennard Nip, MD

## 2018-07-31 NOTE — Telephone Encounter (Signed)
Jillian [name], can we fax a letter requesting for dermatologist to evaluate patient for moles to her face. I did not take note of these moles but I support the patient who is requesting this evaluation.

## 2018-08-01 NOTE — Telephone Encounter (Signed)
Letter printed and faxed

## 2018-08-07 ENCOUNTER — Ambulatory Visit (INDEPENDENT_AMBULATORY_CARE_PROVIDER_SITE_OTHER): Admitting: Family Medicine

## 2018-08-07 ENCOUNTER — Encounter (INDEPENDENT_AMBULATORY_CARE_PROVIDER_SITE_OTHER): Payer: Self-pay | Admitting: Family Medicine

## 2018-08-07 VITALS — BP 128/78 | HR 94 | Ht 66.0 in | Wt 203.0 lb

## 2018-08-07 DIAGNOSIS — Z6832 Body mass index (BMI) 32.0-32.9, adult: Secondary | ICD-10-CM | POA: Diagnosis not present

## 2018-08-07 DIAGNOSIS — E669 Obesity, unspecified: Secondary | ICD-10-CM | POA: Diagnosis not present

## 2018-08-07 DIAGNOSIS — E8881 Metabolic syndrome: Secondary | ICD-10-CM | POA: Diagnosis not present

## 2018-08-07 NOTE — Progress Notes (Signed)
Office: [phone]  /  Fax: [phone]   HPI:   Chief Complaint: OBESITY Gudrun is here to discuss her progress with her obesity treatment plan. She is on the Category 2 plan and is following her eating plan approximately 85 % of the time. She states she is walking 5,000 steps 5 times per week. Tanee has done well maintaining weight over the holidays. She is doing well with portion control during celebration eating and is counting her steps daily.  Her weight is 203 lb (92.1 kg) today and has not lost weight since her last visit. She has lost 9 lbs since starting treatment with [place].  Insulin Resistance Daly has a diagnosis of insulin resistance based on her elevated fasting insulin level >5. Although Natsha's blood glucose readings are still under good control, insulin resistance puts her at greater risk of metabolic syndrome and diabetes. She is not taking metformin currently and is working on diet and weight loss to decrease risk of diabetes. She is having decreased polyphagia and denies hypoglycemia.  ASSESSMENT AND PLAN:  Insulin resistance  Class 1 obesity with serious comorbidity and body mass index (BMI) of 32.0 to 32.9 in adult, unspecified obesity type  PLAN:  Insulin Resistance Aayliah will continue to work on weight loss, exercise, and decreasing simple carbohydrates in her diet to help decrease the risk of diabetes. She was informed that eating too many simple carbohydrates or too many calories at one sitting increases the likelihood of GI side effects. Kamorie agreed to continue with diet and exercise and will follow up with [place] as directed to monitor her progress.  I spent > than 50% of the 15 minute visit on counseling as documented in the note.  Obesity Priscillia is currently in the action stage of change. As such, her goal is to continue with weight loss efforts. She has agreed to follow the Category 2 plan. Anwen has been instructed to work up to a goal of  150 minutes of combined cardio and strengthening exercise per week for weight loss and overall health benefits. We discussed the following Behavioral Modification Strategies today: increasing lean protein intake, decreasing simple carbohydrates, and work on meal planning and easy cooking plans.  Arabelle has agreed to follow up with our clinic in 3 to 4 weeks. She was informed of the importance of frequent follow up visits to maximize her success with intensive lifestyle modifications for her multiple health conditions.  ALLERGIES: No Known Allergies  MEDICATIONS: Current Outpatient Medications on File Prior to Visit  Medication Sig Dispense Refill  . Ascorbic Acid (VITAMIN C) 1000 MG tablet Take 1,000 mg by mouth daily.    Marland Kitchen aspirin EC 81 MG tablet Take 81 mg by mouth daily.    Marland Kitchen CALCIUM-MAGNESIUM-ZINC PO Take 1 capsule by mouth daily.    . cetirizine (ZYRTEC) 10 MG tablet Take 1 tablet (10 mg total) by mouth daily. For allergies. 90 tablet 1  . Coenzyme Q10 (CO Q 10) 100 MG CAPS Take 1 capsule by mouth daily.    . Cyanocobalamin (VITAMIN B 12 PO) Take 1 capsule by mouth daily.     Marland Kitchen estradiol (VIVELLE-DOT) 0.025 MG/24HR PLACE 1 PATCH ONTO THE SKIN 2 TIMES A WEEK. 24 patch 1  . Fluocinonide Emulsified Base 0.05 % CREA APPLY  CREAM EXTERNALLY TWICE DAILY TO  AFFECTED  AREA 60 g 0  . fluticasone (FLONASE) 50 MCG/ACT nasal spray Place 1 spray into both nostrils daily as needed. 48 g 2  .  GARLIC PO Take 371 mg by mouth daily.    Marland Kitchen ibuprofen (ADVIL,MOTRIN) 800 MG tablet Take 1 tablet (800 mg total) by mouth every 8 (eight) hours as needed. 30 tablet 0  . lidocaine (LIDODERM) 5 % Place 1 patch onto the skin every 12 (twelve) hours. Remove & Discard patch within 12 hours. 10 patch 0  . Multiple Vitamins-Minerals (ALIVE ONCE DAILY WOMENS 50+) TABS Take 1 tablet by mouth daily.    . Omega-3 1000 MG CAPS Take 1 capsule by mouth daily.    . Probiotic Product (PROBIOTIC-10) CAPS Take 1 capsule by mouth  daily.    . Turmeric Curcumin 500 MG CAPS Take 1 capsule by mouth daily.    . Vitamin D, Ergocalciferol, (DRISDOL) 1.25 MG (50000 UT) CAPS capsule Take 1 capsule (50,000 Units total) by mouth every 7 (seven) days. 4 capsule 0  . vitamin E 400 UNIT capsule Take 400 Units by mouth daily.     No current facility-administered medications on file prior to visit.     PAST MEDICAL HISTORY: Past Medical History:  Diagnosis Date  . Allergic rhinitis   . Allergy   . Arthritis   . Arthritis of low back   . Back pain   . Cataract   . Cataracts, bilateral   . Constipation   . Dry skin    on hands  . Fibroid uterus   . Hyperlipidemia   . Joint pain   . Osteoarthritis of right shoulder   . Ringing in ears   . Sciatica    sciatica  . Vitamin D deficiency     PAST SURGICAL HISTORY: Past Surgical History:  Procedure Laterality Date  . ABDOMINAL HYSTERECTOMY    . broken ankle    . CARPAL TUNNEL RELEASE Bilateral   . SHOULDER SURGERY Right     SOCIAL HISTORY: Social History   Tobacco Use  . Smoking status: Former Smoker    Packs/day: 1.00    Years: 29.00    Pack years: 29.00    Types: Cigarettes  . Smokeless tobacco: Never Used  . Tobacco comment: 30 years  Substance Use Topics  . Alcohol use: No    Frequency: Never  . Drug use: No    FAMILY HISTORY: Family History  Problem Relation Age of Onset  . Colon cancer Sister   . Colon polyps Sister   . Diabetes Mother        father and 9 siblings  . Kidney disease Mother        renal failure  . Hypertension Mother   . Hyperlipidemia Mother   . Stroke Mother   . Depression Mother   . Anxiety disorder Mother   . Obesity Mother   . Heart disease Father        paternal grandmother  . Obesity Father   . Diabetes Father   . Hypertension Father   . Hyperlipidemia Father   . Eating disorder Father   . Esophageal cancer Neg Hx   . Rectal cancer Neg Hx   . Stomach cancer Neg Hx    ROS: Review of Systems    Endo/Heme/Allergies:       Positive for polyphagia. Negative for hypoglycemia.   PHYSICAL EXAM: Blood pressure 128/78, pulse 94, height 5\' 6"  (1.676 m), weight 203 lb (92.1 kg), SpO2 99 %. Body mass index is 32.77 kg/m. Physical Exam Vitals signs reviewed.  Constitutional:      Appearance: Normal appearance. She is obese.  Cardiovascular:  Rate and Rhythm: Normal rate.  Pulmonary:     Effort: Pulmonary effort is normal.  Musculoskeletal: Normal range of motion.  Skin:    General: Skin is warm and dry.  Neurological:     Mental Status: She is alert and oriented to person, place, and time.  Psychiatric:        Mood and Affect: Mood normal.        Behavior: Behavior normal.    RECENT LABS AND TESTS: BMET    Component Value Date/Time   NA 138 06/12/2018 1024   K 4.2 06/12/2018 1024   CL 102 06/12/2018 1024   CO2 24 06/12/2018 1024   GLUCOSE 82 06/12/2018 1024   GLUCOSE 95 05/29/2018 0920   BUN 10 06/12/2018 1024   CREATININE 0.70 06/12/2018 1024   CALCIUM 9.4 06/12/2018 1024   GFRNONAA 93 06/12/2018 1024   GFRAA 107 06/12/2018 1024   Lab Results  Component Value Date   HGBA1C 5.6 06/12/2018   Lab Results  Component Value Date   INSULIN 8.5 06/12/2018   CBC    Component Value Date/Time   WBC 4.6 06/12/2018 1024   WBC 3.9 (L) 05/21/2017 0920   RBC 4.93 06/12/2018 1024   RBC 4.63 05/21/2017 0920   HGB 13.5 06/12/2018 1024   HCT 42.0 06/12/2018 1024   PLT 232.0 05/21/2017 0920   MCV 85 06/12/2018 1024   MCH 27.4 06/12/2018 1024   MCHC 32.1 06/12/2018 1024   MCHC 33.1 05/21/2017 0920   RDW 13.5 06/12/2018 1024   LYMPHSABS 1.8 06/12/2018 1024   MONOABS 0.3 05/21/2017 0920   EOSABS 0.1 06/12/2018 1024   BASOSABS 0.0 06/12/2018 1024   Iron/TIBC/Ferritin/ %Sat No results found for: IRON, TIBC, FERRITIN, IRONPCTSAT Lipid Panel     Component Value Date/Time   CHOL 232 (H) 05/29/2018 0920   TRIG 56.0 05/29/2018 0920   HDL 86.80 05/29/2018 0920    CHOLHDL 3 05/29/2018 0920   VLDL 11.2 05/29/2018 0920   LDLCALC 134 (H) 05/29/2018 0920   LDLDIRECT 158.6 08/18/2013 0803   Hepatic Function Panel     Component Value Date/Time   PROT 7.4 06/12/2018 1024   ALBUMIN 4.4 06/12/2018 1024   AST 26 06/12/2018 1024   ALT 21 06/12/2018 1024   ALKPHOS 79 06/12/2018 1024   BILITOT 0.2 06/12/2018 1024      Component Value Date/Time   TSH 1.790 06/12/2018 1024   TSH 2.19 05/21/2017 0920   TSH 1.28 01/10/2016 1030   Results for ZELINA, JIMERSON (MRN [id number]) as of 08/07/2018 15:18  Ref. Range 06/12/2018 10:24  Vitamin D, 25-Hydroxy Latest Ref Range: 30.0 - 100.0 ng/mL 36.1    OBESITY BEHAVIORAL INTERVENTION VISIT  Today's visit was # 4   Starting weight: 212 lbs Starting date: 06/12/18 Today's weight : Weight: 203 lb (92.1 kg)  Today's date: 08/07/2018 Total lbs lost to date: 9  ASK: We discussed the diagnosis of obesity with Lucious Groves today and Stefania agreed to give [place] permission to discuss obesity behavioral modification therapy today.  ASSESS: Srihitha has the diagnosis of obesity and her BMI today is 32.7. Jolaine is in the action stage of change.   ADVISE: Emiah was educated on the multiple health risks of obesity as well as the benefit of weight loss to improve her health. She was advised of the need for long term treatment and the importance of lifestyle modifications to improve her current health and to decrease her risk of future health  problems.  AGREE: Multiple dietary modification options and treatment options were discussed and Miliani agreed to follow the recommendations documented in the above note.  ARRANGE: Laketia was educated on the importance of frequent visits to treat obesity as outlined per CMS and USPSTF guidelines and agreed to schedule her next follow up appointment today.  I, Marcille Blanco, am acting as transcriptionist for Starlyn Skeans, MD  I have reviewed the above  documentation for accuracy and completeness, and I agree with the above. -Dennard Nip, MD

## 2018-08-12 ENCOUNTER — Encounter: Payer: Self-pay | Admitting: Primary Care

## 2018-08-12 ENCOUNTER — Ambulatory Visit (INDEPENDENT_AMBULATORY_CARE_PROVIDER_SITE_OTHER): Admitting: Primary Care

## 2018-08-12 VITALS — BP 120/76 | HR 83 | Temp 98.0°F | Ht 66.0 in | Wt 208.5 lb

## 2018-08-12 DIAGNOSIS — J069 Acute upper respiratory infection, unspecified: Secondary | ICD-10-CM | POA: Diagnosis not present

## 2018-08-12 DIAGNOSIS — B9789 Other viral agents as the cause of diseases classified elsewhere: Secondary | ICD-10-CM

## 2018-08-12 MED ORDER — METHYLPREDNISOLONE ACETATE 80 MG/ML IJ SUSP
80.0000 mg | Freq: Once | INTRAMUSCULAR | Status: AC
  Administered 2018-08-12: 80 mg via INTRAMUSCULAR

## 2018-08-12 NOTE — Patient Instructions (Signed)
Continue Zyrtec.  Alternate the Flonase with saline nasal spray for nasal congestion.  Do not exceed 5 days of Sudafed.   Please notify me if you develop persistent fevers of 101, notice increased fatigue or weakness, and/or feel worse after 10 days of onset of symptoms.   Increase consumption of water intake and rest.  It was a pleasure to see you today!   Upper Respiratory Infection, Adult An upper respiratory infection (URI) affects the nose, throat, and upper air passages. URIs are caused by germs (viruses). The most common type of URI is often called "the common cold." Medicines cannot cure URIs, but you can do things at home to relieve your symptoms. URIs usually get better within 7-10 days. Follow these instructions at home: Activity  Rest as needed.  If you have a fever, stay home from work or school until your fever is gone, or until your doctor says you may return to work or school. ? You should stay home until you cannot spread the infection anymore (you are not contagious). ? Your doctor may have you wear a face mask so you have less risk of spreading the infection. Relieving symptoms  Gargle with a salt-water mixture 3-4 times a day or as needed. To make a salt-water mixture, completely dissolve -1 tsp of salt in 1 cup of warm water.  Use a cool-mist humidifier to add moisture to the air. This can help you breathe more easily. Eating and drinking   Drink enough fluid to keep your pee (urine) pale yellow.  Eat soups and other clear broths. General instructions   Take over-the-counter and prescription medicines only as told by your doctor. These include cold medicines, fever reducers, and cough suppressants.  Do not use any products that contain nicotine or tobacco. These include cigarettes and e-cigarettes. If you need help quitting, ask your doctor.  Avoid being where people are smoking (avoid secondhand smoke).  Make sure you get regular shots and get the  flu shot every year.  Keep all follow-up visits as told by your doctor. This is important. How to avoid spreading infection to others   Wash your hands often with soap and water. If you do not have soap and water, use hand sanitizer.  Avoid touching your mouth, face, eyes, or nose.  Cough or sneeze into a tissue or your sleeve or elbow. Do not cough or sneeze into your hand or into the air. Contact a doctor if:  You are getting worse, not better.  You have any of these: ? A fever. ? Chills. ? Brown or red mucus in your nose. ? Yellow or brown fluid (discharge)coming from your nose. ? Pain in your face, especially when you bend forward. ? Swollen neck glands. ? Pain with swallowing. ? White areas in the back of your throat. Get help right away if:  You have shortness of breath that gets worse.  You have very bad or constant: ? Headache. ? Ear pain. ? Pain in your forehead, behind your eyes, and over your cheekbones (sinus pain). ? Chest pain.  You have long-lasting (chronic) lung disease along with any of these: ? Wheezing. ? Long-lasting cough. ? Coughing up blood. ? A change in your usual mucus.  You have a stiff neck.  You have changes in your: ? Vision. ? Hearing. ? Thinking. ? Mood. Summary  An upper respiratory infection (URI) is caused by a germ called a virus. The most common type of URI is often called "the  common cold."  URIs usually get better within 7-10 days.  Take over-the-counter and prescription medicines only as told by your doctor. This information is not intended to replace advice given to you by your health care provider. Make sure you discuss any questions you have with your health care provider. Document Released: 01/23/2008 Document Revised: 03/29/2017 Document Reviewed: 03/29/2017 Elsevier Interactive Patient Education  2019 [employer].

## 2018-08-12 NOTE — Addendum Note (Signed)
Addended by: Jacqualin Combes on: 08/12/2018 10:19 AM   Modules accepted: Orders

## 2018-08-12 NOTE — Progress Notes (Signed)
Subjective:    Patient ID: Jillian [name], female    DOB: 1956-06-21, 62 y.o.   MRN: [id number]  HPI  Jillian [name] is a 62 year old female with a history of allergic rhinitis who presents today with a chief complaint of nasal congestion.  She also reports sinus pressure, headache, sore throat, cough. Her symptoms began four days ago. She denies fevers, sick contacts. She's been doing salt gargles, Mucinex DM, saline nasal spray, Sudafed, and Zyrtec with some help. Her most bothersome symptom is nasal congestion.   Review of Systems  Constitutional: Negative for chills and fever.  HENT: Positive for congestion, postnasal drip, sinus pressure and sore throat. Negative for ear pain.   Respiratory: Positive for cough. Negative for shortness of breath.        Past Medical History:  Diagnosis Date  . Allergic rhinitis   . Allergy   . Arthritis   . Arthritis of low back   . Back pain   . Cataract   . Cataracts, bilateral   . Constipation   . Dry skin    on hands  . Fibroid uterus   . Hyperlipidemia   . Joint pain   . Osteoarthritis of right shoulder   . Ringing in ears   . Sciatica    sciatica  . Vitamin D deficiency      Social History   Socioeconomic History  . Marital status: Married    Spouse name: Jillian [name]  . Number of children: 0  . Years of education: Not on file  . Highest education level: Not on file  Occupational History  . Occupation: Retired [employer]  Social Needs  . Financial resource strain: Not on file  . Food insecurity:    Worry: Not on file    Inability: Not on file  . Transportation needs:    Medical: Not on file    Non-medical: Not on file  Tobacco Use  . Smoking status: Former Smoker    Packs/day: 1.00    Years: 29.00    Pack years: 29.00    Types: Cigarettes  . Smokeless tobacco: Never Used  . Tobacco comment: 30 years  Substance and Sexual Activity  . Alcohol use: No    Frequency: Never  . Drug use: No    . Sexual activity: Not on file  Lifestyle  . Physical activity:    Days per week: Not on file    Minutes per session: Not on file  . Stress: Not on file  Relationships  . Social connections:    Talks on phone: Not on file    Gets together: Not on file    Attends religious service: Not on file    Active member of club or organization: Not on file    Attends meetings of clubs or organizations: Not on file    Relationship status: Not on file  . Intimate partner violence:    Fear of current or ex partner: Not on file    Emotionally abused: Not on file    Physically abused: Not on file    Forced sexual activity: Not on file  Other Topics Concern  . Not on file  Social History Narrative   ** Merged History Encounter **        Past Surgical History:  Procedure Laterality Date  . ABDOMINAL HYSTERECTOMY    . broken ankle    . CARPAL TUNNEL RELEASE Bilateral   . SHOULDER SURGERY Right  Family History  Problem Relation Age of Onset  . Colon cancer Sister   . Colon polyps Sister   . Diabetes Mother        father and 9 siblings  . Kidney disease Mother        renal failure  . Hypertension Mother   . Hyperlipidemia Mother   . Stroke Mother   . Depression Mother   . Anxiety disorder Mother   . Obesity Mother   . Heart disease Father        paternal grandmother  . Obesity Father   . Diabetes Father   . Hypertension Father   . Hyperlipidemia Father   . Eating disorder Father   . Esophageal cancer Neg Hx   . Rectal cancer Neg Hx   . Stomach cancer Neg Hx     No Known Allergies  Current Outpatient Medications on File Prior to Visit  Medication Sig Dispense Refill  . Ascorbic Acid (VITAMIN C) 1000 MG tablet Take 1,000 mg by mouth daily.    Marland Kitchen aspirin EC 81 MG tablet Take 81 mg by mouth daily.    Marland Kitchen CALCIUM-MAGNESIUM-ZINC PO Take 1 capsule by mouth daily.    . cetirizine (ZYRTEC) 10 MG tablet Take 1 tablet (10 mg total) by mouth daily. For allergies. 90 tablet 1  .  Coenzyme Q10 (CO Q 10) 100 MG CAPS Take 1 capsule by mouth daily.    . Cyanocobalamin (VITAMIN B 12 PO) Take 1 capsule by mouth daily.     Marland Kitchen estradiol (VIVELLE-DOT) 0.025 MG/24HR PLACE 1 PATCH ONTO THE SKIN 2 TIMES A WEEK. 24 patch 1  . Fluocinonide Emulsified Base 0.05 % CREA APPLY  CREAM EXTERNALLY TWICE DAILY TO  AFFECTED  AREA 60 g 0  . fluticasone (FLONASE) 50 MCG/ACT nasal spray Place 1 spray into both nostrils daily as needed. 48 g 2  . GARLIC PO Take 619 mg by mouth daily.    Marland Kitchen ibuprofen (ADVIL,MOTRIN) 800 MG tablet Take 1 tablet (800 mg total) by mouth every 8 (eight) hours as needed. 30 tablet 0  . Multiple Vitamins-Minerals (ALIVE ONCE DAILY WOMENS 50+) TABS Take 1 tablet by mouth daily.    . Omega-3 1000 MG CAPS Take 1 capsule by mouth daily.    . Probiotic Product (PROBIOTIC-10) CAPS Take 1 capsule by mouth daily.    . Turmeric Curcumin 500 MG CAPS Take 1 capsule by mouth daily.    . Vitamin D, Ergocalciferol, (DRISDOL) 1.25 MG (50000 UT) CAPS capsule Take 1 capsule (50,000 Units total) by mouth every 7 (seven) days. 4 capsule 0  . vitamin E 400 UNIT capsule Take 400 Units by mouth daily.     No current facility-administered medications on file prior to visit.     BP 120/76   Pulse 83   Temp 98 F (36.7 C) (Oral)   Ht 5\' 6"  (1.676 m)   Wt 208 lb 8 oz (94.6 kg)   SpO2 98%   BMI 33.65 kg/m    Objective:   Physical Exam  Constitutional: She appears well-nourished. She does not appear ill.  HENT:  Right Ear: Tympanic membrane and ear canal normal.  Left Ear: Tympanic membrane and ear canal normal.  Nose: Mucosal edema present. Right sinus exhibits maxillary sinus tenderness. Right sinus exhibits no frontal sinus tenderness. Left sinus exhibits maxillary sinus tenderness. Left sinus exhibits no frontal sinus tenderness.  Mouth/Throat: Oropharynx is clear and moist.  Neck: Neck supple.  Cardiovascular: Normal rate and  regular rhythm.  Respiratory: Effort normal and breath  sounds normal. She has no wheezes.  Skin: Skin is warm and dry.           Assessment & Plan:  URI vs viral sinusitis:  Nasal congestion, sinus pressure, cough x 4 days. Exam today with clear lungs, doesn't appear sickly, does have moderate nasal mucous edema and maxillary sinus pressure. Do suspect viral involvement and will treat with conservative measures. Continue Zyrtec and nasal sprays. IM Depo medrol provided today for pressure. Return precautions provided.  Pleas Koch, NP

## 2018-08-21 ENCOUNTER — Other Ambulatory Visit (INDEPENDENT_AMBULATORY_CARE_PROVIDER_SITE_OTHER): Payer: Self-pay | Admitting: Family Medicine

## 2018-08-21 DIAGNOSIS — E559 Vitamin D deficiency, unspecified: Secondary | ICD-10-CM

## 2018-08-21 MED ORDER — VITAMIN D (ERGOCALCIFEROL) 1.25 MG (50000 UNIT) PO CAPS: 50000.0000 [IU] | ORAL_CAPSULE | ORAL | 0 refills | Status: DC

## 2018-09-04 ENCOUNTER — Ambulatory Visit (INDEPENDENT_AMBULATORY_CARE_PROVIDER_SITE_OTHER): Admitting: Family Medicine

## 2018-09-04 ENCOUNTER — Encounter (INDEPENDENT_AMBULATORY_CARE_PROVIDER_SITE_OTHER): Payer: Self-pay | Admitting: Family Medicine

## 2018-09-04 VITALS — BP 136/78 | HR 69 | Temp 98.1°F | Ht 66.0 in | Wt 203.0 lb

## 2018-09-04 DIAGNOSIS — Z6832 Body mass index (BMI) 32.0-32.9, adult: Secondary | ICD-10-CM

## 2018-09-04 DIAGNOSIS — E669 Obesity, unspecified: Secondary | ICD-10-CM

## 2018-09-04 DIAGNOSIS — E8881 Metabolic syndrome: Secondary | ICD-10-CM

## 2018-09-04 DIAGNOSIS — R030 Elevated blood-pressure reading, without diagnosis of hypertension: Secondary | ICD-10-CM

## 2018-09-04 MED ORDER — METFORMIN HCL 500 MG PO TABS: 500.0000 mg | ORAL_TABLET | Freq: Every day | ORAL | 0 refills | Status: DC

## 2018-09-08 NOTE — Progress Notes (Signed)
Office: [phone]  /  Fax: [phone]   HPI:   Chief Complaint: OBESITY Jillian [name] is here to discuss her progress with her obesity treatment plan. She is on the Category 2 plan and is following her eating plan approximately 50 to 60 % of the time. She states she is walking and using the elliptical at the gym 60 to 90 minutes 2 times per week. Jillian [name] did well maintaining her weight over the holidays. She is ready to get back on track, but notes an increase in evening hunger and getting bored with her plan.  Her weight is 203 lb (92.1 kg) today and has not lost weight since her last visit. She has lost 9 lbs since starting treatment with [place].  Insulin Resistance Jillian [name] has a diagnosis of insulin resistance based on her elevated fasting insulin level >5. Although Jillian [name]'s blood glucose readings are still under good control, insulin resistance puts her at greater risk of metabolic syndrome and diabetes. She notes an increase in evening polyphagia, worse with simple carbs over the holidays. She is not taking metformin currently and continues to work on diet and exercise to decrease risk of diabetes.  Pre-Hypertension Jillian [name]'s blood pressure is elevated today, which is normally lower. This may be due to increased sodium. She denies chest pain or headaches.  ASSESSMENT AND PLAN:  Insulin resistance - Plan: metFORMIN (GLUCOPHAGE) 500 MG tablet  Prehypertension  Class 1 obesity with serious comorbidity and body mass index (BMI) of 32.0 to 32.9 in adult, unspecified obesity type  PLAN:  Insulin Resistance Jillian [name] will continue to work on weight loss, exercise, and decreasing simple carbohydrates in her diet to help decrease the risk of diabetes. We discussed metformin including benefits and risks. She was informed that eating too many simple carbohydrates or too many calories at one sitting increases the likelihood of GI side effects. Jillian [name] agreed to continue her diet prescription and to  start metformin 500mg  qAM #30 with no refills and a prescription was written today. Jillian [name] agreed to follow up with [place] as directed to monitor her progress in 2 to 3 weeks.  Pre-Hypertension Jillian [name] agrees to get back to her diet prescription and weight loss. We will recheck her blood pressure in 2 to 3 weeks. She agrees with this plan.  Obesity Jillian [name] is currently in the action stage of change. As such, her goal is to continue with weight loss efforts. She has agreed to follow the Category 2 plan and keep a food journal of 400 to 600 calories and 40 grams of protein for supper. Jillian [name] has been instructed to work up to a goal of 150 minutes of combined cardio and strengthening exercise per week for weight loss and overall health benefits. We discussed the following Behavioral Modification Strategies today: increasing lean protein intake, decreasing simple carbohydrates, keeping healthy foods in the home, planning for success, and keeping a strict food journal.  Jillian [name] has agreed to follow up with our clinic in 2 to 3 weeks for a fasting appointment. She was informed of the importance of frequent follow up visits to maximize her success with intensive lifestyle modifications for her multiple health conditions.  ALLERGIES: No Known Allergies  MEDICATIONS: Current Outpatient Medications on File Prior to Visit  Medication Sig Dispense Refill  . Ascorbic Acid (VITAMIN C) 1000 MG tablet Take 1,000 mg by mouth daily.    Marland Kitchen aspirin EC 81 MG tablet Take 81 mg by mouth daily.    Marland Kitchen CALCIUM-MAGNESIUM-ZINC PO Take 1 capsule  by mouth daily.    . cetirizine (ZYRTEC) 10 MG tablet Take 1 tablet (10 mg total) by mouth daily. For allergies. 90 tablet 1  . Coenzyme Q10 (CO Q 10) 100 MG CAPS Take 1 capsule by mouth daily.    . Cyanocobalamin (VITAMIN B 12 PO) Take 1 capsule by mouth daily.     Marland Kitchen estradiol (VIVELLE-DOT) 0.025 MG/24HR PLACE 1 PATCH ONTO THE SKIN 2 TIMES A WEEK. 24 patch 1  . Fluocinonide  Emulsified Base 0.05 % CREA APPLY  CREAM EXTERNALLY TWICE DAILY TO  AFFECTED  AREA 60 g 0  . fluticasone (FLONASE) 50 MCG/ACT nasal spray Place 1 spray into both nostrils daily as needed. 48 g 2  . GARLIC PO Take 026 mg by mouth daily.    Marland Kitchen ibuprofen (ADVIL,MOTRIN) 800 MG tablet Take 1 tablet (800 mg total) by mouth every 8 (eight) hours as needed. 30 tablet 0  . Multiple Vitamins-Minerals (ALIVE ONCE DAILY WOMENS 50+) TABS Take 1 tablet by mouth daily.    . Omega-3 1000 MG CAPS Take 1 capsule by mouth daily.    . Probiotic Product (PROBIOTIC-10) CAPS Take 1 capsule by mouth daily.    . Turmeric Curcumin 500 MG CAPS Take 1 capsule by mouth daily.    . Vitamin D, Ergocalciferol, (DRISDOL) 1.25 MG (50000 UT) CAPS capsule Take 1 capsule (50,000 Units total) by mouth every 7 (seven) days. 4 capsule 0  . vitamin E 400 UNIT capsule Take 400 Units by mouth daily.     No current facility-administered medications on file prior to visit.     PAST MEDICAL HISTORY: Past Medical History:  Diagnosis Date  . Allergic rhinitis   . Allergy   . Arthritis   . Arthritis of low back   . Back pain   . Cataract   . Cataracts, bilateral   . Constipation   . Dry skin    on hands  . Fibroid uterus   . Hyperlipidemia   . Joint pain   . Osteoarthritis of right shoulder   . Ringing in ears   . Sciatica    sciatica  . Vitamin D deficiency     PAST SURGICAL HISTORY: Past Surgical History:  Procedure Laterality Date  . ABDOMINAL HYSTERECTOMY    . broken ankle    . CARPAL TUNNEL RELEASE Bilateral   . SHOULDER SURGERY Right     SOCIAL HISTORY: Social History   Tobacco Use  . Smoking status: Former Smoker    Packs/day: 1.00    Years: 29.00    Pack years: 29.00    Types: Cigarettes  . Smokeless tobacco: Never Used  . Tobacco comment: 30 years  Substance Use Topics  . Alcohol use: No    Frequency: Never  . Drug use: No    FAMILY HISTORY: Family History  Problem Relation Age of Onset  .  Colon cancer Sister   . Colon polyps Sister   . Diabetes Mother        father and 9 siblings  . Kidney disease Mother        renal failure  . Hypertension Mother   . Hyperlipidemia Mother   . Stroke Mother   . Depression Mother   . Anxiety disorder Mother   . Obesity Mother   . Heart disease Father        paternal grandmother  . Obesity Father   . Diabetes Father   . Hypertension Father   . Hyperlipidemia Father   .  Eating disorder Father   . Esophageal cancer Neg Hx   . Rectal cancer Neg Hx   . Stomach cancer Neg Hx    ROS: Review of Systems  Constitutional: Negative for weight loss.  Cardiovascular: Negative for chest pain.  Neurological: Negative for headaches.  Endo/Heme/Allergies:       Positive for polyphagia.   PHYSICAL EXAM: Blood pressure 136/78, pulse 69, temperature 98.1 F (36.7 C), temperature source Oral, height 5\' 6"  (1.676 m), weight 203 lb (92.1 kg), SpO2 99 %. Body mass index is 32.77 kg/m. Physical Exam Vitals signs reviewed.  Constitutional:      Appearance: Normal appearance. She is obese.  Cardiovascular:     Rate and Rhythm: Normal rate.  Pulmonary:     Effort: Pulmonary effort is normal.  Musculoskeletal: Normal range of motion.  Skin:    General: Skin is warm and dry.  Neurological:     Mental Status: She is alert and oriented to person, place, and time.  Psychiatric:        Mood and Affect: Mood normal.        Behavior: Behavior normal.    RECENT LABS AND TESTS: BMET    Component Value Date/Time   NA 138 06/12/2018 1024   K 4.2 06/12/2018 1024   CL 102 06/12/2018 1024   CO2 24 06/12/2018 1024   GLUCOSE 82 06/12/2018 1024   GLUCOSE 95 05/29/2018 0920   BUN 10 06/12/2018 1024   CREATININE 0.70 06/12/2018 1024   CALCIUM 9.4 06/12/2018 1024   GFRNONAA 93 06/12/2018 1024   GFRAA 107 06/12/2018 1024   Lab Results  Component Value Date   HGBA1C 5.6 06/12/2018   Lab Results  Component Value Date   INSULIN 8.5 06/12/2018    CBC    Component Value Date/Time   WBC 4.6 06/12/2018 1024   WBC 3.9 (L) 05/21/2017 0920   RBC 4.93 06/12/2018 1024   RBC 4.63 05/21/2017 0920   HGB 13.5 06/12/2018 1024   HCT 42.0 06/12/2018 1024   PLT 232.0 05/21/2017 0920   MCV 85 06/12/2018 1024   MCH 27.4 06/12/2018 1024   MCHC 32.1 06/12/2018 1024   MCHC 33.1 05/21/2017 0920   RDW 13.5 06/12/2018 1024   LYMPHSABS 1.8 06/12/2018 1024   MONOABS 0.3 05/21/2017 0920   EOSABS 0.1 06/12/2018 1024   BASOSABS 0.0 06/12/2018 1024   Iron/TIBC/Ferritin/ %Sat No results found for: IRON, TIBC, FERRITIN, IRONPCTSAT Lipid Panel     Component Value Date/Time   CHOL 232 (H) 05/29/2018 0920   TRIG 56.0 05/29/2018 0920   HDL 86.80 05/29/2018 0920   CHOLHDL 3 05/29/2018 0920   VLDL 11.2 05/29/2018 0920   LDLCALC 134 (H) 05/29/2018 0920   LDLDIRECT 158.6 08/18/2013 0803   Hepatic Function Panel     Component Value Date/Time   PROT 7.4 06/12/2018 1024   ALBUMIN 4.4 06/12/2018 1024   AST 26 06/12/2018 1024   ALT 21 06/12/2018 1024   ALKPHOS 79 06/12/2018 1024   BILITOT 0.2 06/12/2018 1024      Component Value Date/Time   TSH 1.790 06/12/2018 1024   TSH 2.19 05/21/2017 0920   TSH 1.28 01/10/2016 1030   Results for LOTA, LEAMER (MRN [id number]) as of 09/08/2018 06:28  Ref. Range 06/12/2018 10:24  Vitamin D, 25-Hydroxy Latest Ref Range: 30.0 - 100.0 ng/mL 36.1    OBESITY BEHAVIORAL INTERVENTION VISIT  Today's visit was # 5   Starting weight: 212 lbs Starting date: 06/12/18 Today's weight :  Weight: 203 lb (92.1 kg)  Today's date: 09/04/2018 Total lbs lost to date: 9 At least 15 minutes were spent on discussing the following behavioral intervention visit.  ASK: We discussed the diagnosis of obesity with Jillian [name] today and Jillian [name] agreed to give [place] permission to discuss obesity behavioral modification therapy today.  ASSESS: Jillian [name] has the diagnosis of obesity and her BMI today is  32.7. Jillian [name] is in the action stage of change.   ADVISE: Jillian [name] was educated on the multiple health risks of obesity as well as the benefit of weight loss to improve her health. She was advised of the need for long term treatment and the importance of lifestyle modifications to improve her current health and to decrease her risk of future health problems.  AGREE: Multiple dietary modification options and treatment options were discussed and Jillian [name] agreed to follow the recommendations documented in the above note.  ARRANGE: Jillian [name] was educated on the importance of frequent visits to treat obesity as outlined per CMS and USPSTF guidelines and agreed to schedule her next follow up appointment today.  I, Marcille Blanco, am acting as transcriptionist for Starlyn Skeans, MD  I have reviewed the above documentation for accuracy and completeness, and I agree with the above. -Dennard Nip, MD

## 2018-09-20 ENCOUNTER — Other Ambulatory Visit (INDEPENDENT_AMBULATORY_CARE_PROVIDER_SITE_OTHER): Payer: Self-pay | Admitting: Bariatrics

## 2018-09-20 DIAGNOSIS — E559 Vitamin D deficiency, unspecified: Secondary | ICD-10-CM

## 2018-09-30 ENCOUNTER — Ambulatory Visit (INDEPENDENT_AMBULATORY_CARE_PROVIDER_SITE_OTHER): Admitting: Family Medicine

## 2018-09-30 ENCOUNTER — Encounter (INDEPENDENT_AMBULATORY_CARE_PROVIDER_SITE_OTHER): Payer: Self-pay | Admitting: Family Medicine

## 2018-09-30 VITALS — BP 133/83 | HR 64 | Temp 97.9°F | Ht 66.0 in | Wt 201.0 lb

## 2018-09-30 DIAGNOSIS — E669 Obesity, unspecified: Secondary | ICD-10-CM

## 2018-09-30 DIAGNOSIS — Z6832 Body mass index (BMI) 32.0-32.9, adult: Secondary | ICD-10-CM

## 2018-09-30 DIAGNOSIS — R7303 Prediabetes: Secondary | ICD-10-CM

## 2018-09-30 DIAGNOSIS — E559 Vitamin D deficiency, unspecified: Secondary | ICD-10-CM | POA: Diagnosis not present

## 2018-09-30 DIAGNOSIS — Z9189 Other specified personal risk factors, not elsewhere classified: Secondary | ICD-10-CM

## 2018-09-30 MED ORDER — VITAMIN D (ERGOCALCIFEROL) 1.25 MG (50000 UNIT) PO CAPS: 50000.0000 [IU] | ORAL_CAPSULE | ORAL | 0 refills | Status: DC

## 2018-09-30 MED ORDER — METFORMIN HCL 500 MG PO TABS: 500.0000 mg | ORAL_TABLET | Freq: Every day | ORAL | 0 refills | Status: DC

## 2018-09-30 NOTE — Progress Notes (Signed)
Office: [phone]  /  Fax: [phone]   HPI:   Chief Complaint: OBESITY Jillian [name] is here to discuss her progress with her obesity treatment plan. She is on the keep a food journal with 400 to 600 calories and 40 grams of protein at supper daily and the Category 2 plan and is following her eating plan approximately 80 to 85 % of the time. She states she is doing circuit, treadmill, elliptical and chair yoga for 60 to 90 minutes 2 to 3 times per week. Jillian [name] continues to do well with weight loss. Jillian [name] likes journaling for dinner and she would like all day journaling goals. Her weight is 201 lb (91.2 kg) today and has had a weight loss of 2 pounds over a period of 3 to 4 weeks since her last visit. She has lost 11 lbs since starting treatment with [place].  Pre-Diabetes Jillian [name] has a diagnosis of prediabetes based on her elevated Hgb A1c and was informed this puts her at greater risk of developing diabetes. Jillian [name] started taking metformin and she notes decreased polyphagia. She continues to work on diet and exercise to decrease risk of diabetes. She denies nausea, vomiting or hypoglycemia.  At risk for diabetes Jillian [name] is at higher than average risk for developing diabetes due to her obesity and prediabetes. She currently denies polyuria or polydipsia.  Vitamin D deficiency Jillian [name] has a diagnosis of vitamin D deficiency. Jillian [name] is stable on vit D and she denies nausea, vomiting or muscle weakness.  ASSESSMENT AND PLAN:  Prediabetes - Plan: metFORMIN (GLUCOPHAGE) 500 MG tablet  Vitamin D deficiency - Plan: Vitamin D, Ergocalciferol, (DRISDOL) 1.25 MG (50000 UT) CAPS capsule  At risk for diabetes mellitus  Class 1 obesity with serious comorbidity and body mass index (BMI) of 32.0 to 32.9 in adult, unspecified obesity type  PLAN:  Pre-Diabetes Jillian [name] will continue to work on weight loss, exercise, and decreasing simple carbohydrates in her diet to help decrease the risk of  diabetes. We dicussed metformin including benefits and risks. She was informed that eating too many simple carbohydrates or too many calories at one sitting increases the likelihood of GI side effects. Jillian [name] agreed to continue metformin 500 mg daily #30 with no refills and follow up with [place] as directed to monitor her progress.  Diabetes risk counseling Jillian [name] was given extended (15 minutes) diabetes prevention counseling today. She is 63 y.o. female and has risk factors for diabetes including obesity and prediabetes. We discussed intensive lifestyle modifications today with an emphasis on weight loss as well as increasing exercise and decreasing simple carbohydrates in her diet.  Vitamin D Deficiency Jillian [name] was informed that low vitamin D levels contributes to fatigue and are associated with obesity, breast, and colon cancer. She agrees to continue to take prescription Vit D @50 ,000 IU every week #4 with no refills and will follow up for routine testing of vitamin D, at least 2-3 times per year. She was informed of the risk of over-replacement of vitamin D and agrees to not increase her dose unless she discusses this with [place] first. Jillian [name] agrees to follow up as directed.  Obesity Jillian [name] is currently in the action stage of change. As such, her goal is to continue with weight loss efforts She has agreed to keep a food journal with 1200 to 1300 calories and 75+ grams of protein daily Jillian [name] has been instructed to work up to a goal of 150 minutes of combined cardio and strengthening exercise per week for weight  loss and overall health benefits. We discussed the following Behavioral Modification Strategies today: increasing lean protein intake, decreasing simple carbohydrates and work on meal planning and easy cooking plans  Jillian [name] has agreed to follow up with our clinic in 3 to 4 weeks fasting. She was informed of the importance of frequent follow up visits to maximize her success with intensive  lifestyle modifications for her multiple health conditions.  ALLERGIES: No Known Allergies  MEDICATIONS: Current Outpatient Medications on File Prior to Visit  Medication Sig Dispense Refill  . Ascorbic Acid (VITAMIN C) 1000 MG tablet Take 1,000 mg by mouth daily.    Marland Kitchen aspirin EC 81 MG tablet Take 81 mg by mouth daily.    Marland Kitchen CALCIUM-MAGNESIUM-ZINC PO Take 1 capsule by mouth daily.    . cetirizine (ZYRTEC) 10 MG tablet Take 1 tablet (10 mg total) by mouth daily. For allergies. 90 tablet 1  . Coenzyme Q10 (CO Q 10) 100 MG CAPS Take 1 capsule by mouth daily.    . Cyanocobalamin (VITAMIN B 12 PO) Take 1 capsule by mouth daily.     Marland Kitchen estradiol (VIVELLE-DOT) 0.025 MG/24HR PLACE 1 PATCH ONTO THE SKIN 2 TIMES A WEEK. 24 patch 1  . Fluocinonide Emulsified Base 0.05 % CREA APPLY  CREAM EXTERNALLY TWICE DAILY TO  AFFECTED  AREA 60 g 0  . fluticasone (FLONASE) 50 MCG/ACT nasal spray Place 1 spray into both nostrils daily as needed. 48 g 2  . GARLIC PO Take 237 mg by mouth daily.    Marland Kitchen ibuprofen (ADVIL,MOTRIN) 800 MG tablet Take 1 tablet (800 mg total) by mouth every 8 (eight) hours as needed. 30 tablet 0  . Multiple Vitamins-Minerals (ALIVE ONCE DAILY WOMENS 50+) TABS Take 1 tablet by mouth daily.    . Omega-3 1000 MG CAPS Take 1 capsule by mouth daily.    . Probiotic Product (PROBIOTIC-10) CAPS Take 1 capsule by mouth daily.    . Turmeric Curcumin 500 MG CAPS Take 1 capsule by mouth daily.    . vitamin E 400 UNIT capsule Take 400 Units by mouth daily.     No current facility-administered medications on file prior to visit.     PAST MEDICAL HISTORY: Past Medical History:  Diagnosis Date  . Allergic rhinitis   . Allergy   . Arthritis   . Arthritis of low back   . Back pain   . Cataract   . Cataracts, bilateral   . Constipation   . Dry skin    on hands  . Fibroid uterus   . Hyperlipidemia   . Joint pain   . Osteoarthritis of right shoulder   . Ringing in ears   . Sciatica    sciatica    . Vitamin D deficiency     PAST SURGICAL HISTORY: Past Surgical History:  Procedure Laterality Date  . ABDOMINAL HYSTERECTOMY    . broken ankle    . CARPAL TUNNEL RELEASE Bilateral   . SHOULDER SURGERY Right     SOCIAL HISTORY: Social History   Tobacco Use  . Smoking status: Former Smoker    Packs/day: 1.00    Years: 29.00    Pack years: 29.00    Types: Cigarettes  . Smokeless tobacco: Never Used  . Tobacco comment: 30 years  Substance Use Topics  . Alcohol use: No    Frequency: Never  . Drug use: No    FAMILY HISTORY: Family History  Problem Relation Age of Onset  . Colon cancer Sister   .  Colon polyps Sister   . Diabetes Mother        father and 9 siblings  . Kidney disease Mother        renal failure  . Hypertension Mother   . Hyperlipidemia Mother   . Stroke Mother   . Depression Mother   . Anxiety disorder Mother   . Obesity Mother   . Heart disease Father        paternal grandmother  . Obesity Father   . Diabetes Father   . Hypertension Father   . Hyperlipidemia Father   . Eating disorder Father   . Esophageal cancer Neg Hx   . Rectal cancer Neg Hx   . Stomach cancer Neg Hx     ROS: Review of Systems  Constitutional: Positive for weight loss.  Gastrointestinal: Negative for nausea and vomiting.  Genitourinary: Negative for frequency.  Musculoskeletal:       Negative for muscle weakness  Endo/Heme/Allergies: Negative for polydipsia.       Negative for hypoglycemia     PHYSICAL EXAM: Blood pressure 133/83, pulse 64, temperature 97.9 F (36.6 C), temperature source Oral, height 5\' 6"  (1.676 m), weight 201 lb (91.2 kg), SpO2 99 %. Body mass index is 32.44 kg/m. Physical Exam Vitals signs reviewed.  Constitutional:      Appearance: Normal appearance. She is well-developed. She is obese.  Cardiovascular:     Rate and Rhythm: Normal rate.  Pulmonary:     Effort: Pulmonary effort is normal.  Musculoskeletal: Normal range of motion.   Skin:    General: Skin is warm and dry.  Neurological:     Mental Status: She is alert and oriented to person, place, and time.  Psychiatric:        Mood and Affect: Mood normal.        Behavior: Behavior normal.     RECENT LABS AND TESTS: BMET    Component Value Date/Time   NA 138 06/12/2018 1024   K 4.2 06/12/2018 1024   CL 102 06/12/2018 1024   CO2 24 06/12/2018 1024   GLUCOSE 82 06/12/2018 1024   GLUCOSE 95 05/29/2018 0920   BUN 10 06/12/2018 1024   CREATININE 0.70 06/12/2018 1024   CALCIUM 9.4 06/12/2018 1024   GFRNONAA 93 06/12/2018 1024   GFRAA 107 06/12/2018 1024   Lab Results  Component Value Date   HGBA1C 5.6 06/12/2018   Lab Results  Component Value Date   INSULIN 8.5 06/12/2018   CBC    Component Value Date/Time   WBC 4.6 06/12/2018 1024   WBC 3.9 (L) 05/21/2017 0920   RBC 4.93 06/12/2018 1024   RBC 4.63 05/21/2017 0920   HGB 13.5 06/12/2018 1024   HCT 42.0 06/12/2018 1024   PLT 232.0 05/21/2017 0920   MCV 85 06/12/2018 1024   MCH 27.4 06/12/2018 1024   MCHC 32.1 06/12/2018 1024   MCHC 33.1 05/21/2017 0920   RDW 13.5 06/12/2018 1024   LYMPHSABS 1.8 06/12/2018 1024   MONOABS 0.3 05/21/2017 0920   EOSABS 0.1 06/12/2018 1024   BASOSABS 0.0 06/12/2018 1024   Iron/TIBC/Ferritin/ %Sat No results found for: IRON, TIBC, FERRITIN, IRONPCTSAT Lipid Panel     Component Value Date/Time   CHOL 232 (H) 05/29/2018 0920   TRIG 56.0 05/29/2018 0920   HDL 86.80 05/29/2018 0920   CHOLHDL 3 05/29/2018 0920   VLDL 11.2 05/29/2018 0920   LDLCALC 134 (H) 05/29/2018 0920   LDLDIRECT 158.6 08/18/2013 0803   Hepatic Function Panel  Component Value Date/Time   PROT 7.4 06/12/2018 1024   ALBUMIN 4.4 06/12/2018 1024   AST 26 06/12/2018 1024   ALT 21 06/12/2018 1024   ALKPHOS 79 06/12/2018 1024   BILITOT 0.2 06/12/2018 1024      Component Value Date/Time   TSH 1.790 06/12/2018 1024   TSH 2.19 05/21/2017 0920   TSH 1.28 01/10/2016 1030     Ref.  Range 06/12/2018 10:24  Vitamin D, 25-Hydroxy Latest Ref Range: 30.0 - 100.0 ng/mL 36.1     OBESITY BEHAVIORAL INTERVENTION VISIT  Today's visit was # 6   Starting weight: 212 lbs Starting date: 06/12/2018 Today's weight : 201 lbs Today's date: 09/30/2018 Total lbs lost to date: 39   ASK: We discussed the diagnosis of obesity with Jillian [name] today and Jillian [name] agreed to give [place] permission to discuss obesity behavioral modification therapy today.  ASSESS: Jillian [name] has the diagnosis of obesity and her BMI today is 32.46 Jillian [name] is in the action stage of change   ADVISE: Jillian [name] was educated on the multiple health risks of obesity as well as the benefit of weight loss to improve her health. She was advised of the need for long term treatment and the importance of lifestyle modifications to improve her current health and to decrease her risk of future health problems.  AGREE: Multiple dietary modification options and treatment options were discussed and  Jillian [name] agreed to follow the recommendations documented in the above note.  ARRANGE: Jillian [name] was educated on the importance of frequent visits to treat obesity as outlined per CMS and USPSTF guidelines and agreed to schedule her next follow up appointment today.  I, Doreene Nest, am acting as transcriptionist for Dennard Nip, MD  I have reviewed the above documentation for accuracy and completeness, and I agree with the above. -Dennard Nip, MD

## 2018-10-27 ENCOUNTER — Encounter (INDEPENDENT_AMBULATORY_CARE_PROVIDER_SITE_OTHER): Payer: Self-pay | Admitting: Family Medicine

## 2018-10-27 ENCOUNTER — Ambulatory Visit (INDEPENDENT_AMBULATORY_CARE_PROVIDER_SITE_OTHER): Admitting: Family Medicine

## 2018-10-27 VITALS — BP 127/80 | HR 68 | Ht 66.0 in | Wt 204.0 lb

## 2018-10-27 DIAGNOSIS — E559 Vitamin D deficiency, unspecified: Secondary | ICD-10-CM

## 2018-10-27 DIAGNOSIS — Z6832 Body mass index (BMI) 32.0-32.9, adult: Secondary | ICD-10-CM

## 2018-10-27 DIAGNOSIS — E669 Obesity, unspecified: Secondary | ICD-10-CM

## 2018-10-27 DIAGNOSIS — R7303 Prediabetes: Secondary | ICD-10-CM | POA: Diagnosis not present

## 2018-10-27 MED ORDER — VITAMIN D (ERGOCALCIFEROL) 1.25 MG (50000 UNIT) PO CAPS: 50000.0000 [IU] | ORAL_CAPSULE | ORAL | 0 refills | Status: DC

## 2018-10-27 MED ORDER — METFORMIN HCL 500 MG PO TABS: 500.0000 mg | ORAL_TABLET | Freq: Every day | ORAL | 0 refills | Status: DC

## 2018-10-27 NOTE — Progress Notes (Signed)
Office: [phone]  /  Fax: [phone]   HPI:   Chief Complaint: OBESITY Jillian [name] is here to discuss her progress with her obesity treatment plan. She is keeping a food journal with 1200 to 1300 calories and 75+ grams of protein and is following her eating plan approximately 50 % of the time. She states she is  going to the gym and using the elliptical, treadmill and weights 90 minutes 2 times per week. Jillian [name] has been trying to keep a food journal and has decided to decrease her calories to 1200 per day with 60 grams of protein. She is surprised that she has gained weight.  Her weight is 204 lb (92.5 kg) today and has had a weight gain of 3 pounds over a period of 4 weeks since her last visit. She has lost 8 lbs since starting treatment with [place].  Pre-Diabetes Jillian [name] has a diagnosis of pre-diabetes based on her elevated Hgb A1c and was informed this puts her at greater risk of developing diabetes. She is stable on metformin currently and continues to work on diet and exercise to decrease risk of diabetes. She denies nausea, vomiting, or hypoglycemia.  Vitamin D deficiency Jillian [name] has a diagnosis of vitamin D deficiency. She is currently stable on vit D. She notes fatigue and denies nausea, vomiting, or muscle weakness.  ASSESSMENT AND PLAN:  Prediabetes - Plan: Vitamin B12, Lipid Panel With LDL/HDL Ratio, Hemoglobin A1c, Insulin, random, CBC With Differential, Comprehensive metabolic panel, metFORMIN (GLUCOPHAGE) 500 MG tablet  Vitamin D deficiency - Plan: VITAMIN D 25 Hydroxy (Vit-D Deficiency, Fractures), Vitamin D, Ergocalciferol, (DRISDOL) 1.25 MG (50000 UT) CAPS capsule  Class 1 obesity with serious comorbidity and body mass index (BMI) of 32.0 to 32.9 in adult, unspecified obesity type  PLAN:  Pre-Diabetes Jillian [name] will continue to work on weight loss, exercise, and decreasing simple carbohydrates in her diet to help decrease the risk of diabetes. She was informed that eating  too many simple carbohydrates or too many calories at one sitting increases the likelihood of GI side effects. Jillian [name] agreed to continue metformin 500mg  qAM #30 with no refills and a prescription was written today. Jillian [name] agreed to follow up with [place] as directed to monitor her progress in 3 to 4 weeks.  Vitamin D Deficiency Jillian [name] was informed that low vitamin D levels contributes to fatigue and are associated with obesity, breast, and colon cancer. Jillian [name] agrees to continue to take prescription Vit D @50 ,000 IU every week #4 with no refills and will follow up for routine testing of vitamin D, at least 2-3 times per year. She was informed of the risk of over-replacement of vitamin D and agrees to not increase her dose unless she discusses this with [place] first. Jillian [name] agrees to follow up in 3 to 4 weeks as directed.  Obesity Jillian [name] is currently in the action stage of change. As such, her goal is to continue with weight loss efforts. She has agreed to keep a food journal with 1400 to 1500 calories and 80+ grams of protein.  Jillian [name] has been instructed to work up to a goal of 150 minutes of combined cardio and strengthening exercise per week or continue going to the gym and using the elliptical, treadmill and weights for weight loss and overall health benefits. We discussed the following Behavioral Modification Strategies today: increasing lean protein intake, decreasing simple carbohydrates, and work on meal planning and easy cooking plans. Jillian [name] was advised that she has likely decreased her resting metabolic  rate due to decreasing calories to low and inadequate protein. She agreed to increase macronutrients and will follow up as directed.  Jillian [name] has agreed to follow up with our clinic in 3 to 4 weeks. She was informed of the importance of frequent follow up visits to maximize her success with intensive lifestyle modifications for her multiple health conditions.  ALLERGIES: No Known  Allergies  MEDICATIONS: Current Outpatient Medications on File Prior to Visit  Medication Sig Dispense Refill  . Ascorbic Acid (VITAMIN C) 1000 MG tablet Take 1,000 mg by mouth daily.    Marland Kitchen aspirin EC 81 MG tablet Take 81 mg by mouth daily.    Marland Kitchen CALCIUM-MAGNESIUM-ZINC PO Take 1 capsule by mouth daily.    . cetirizine (ZYRTEC) 10 MG tablet Take 1 tablet (10 mg total) by mouth daily. For allergies. 90 tablet 1  . Coenzyme Q10 (CO Q 10) 100 MG CAPS Take 1 capsule by mouth daily.    . Cyanocobalamin (VITAMIN B 12 PO) Take 1 capsule by mouth daily.     Marland Kitchen estradiol (VIVELLE-DOT) 0.025 MG/24HR PLACE 1 PATCH ONTO THE SKIN 2 TIMES A WEEK. 24 patch 1  . Fluocinonide Emulsified Base 0.05 % CREA APPLY  CREAM EXTERNALLY TWICE DAILY TO  AFFECTED  AREA 60 g 0  . fluticasone (FLONASE) 50 MCG/ACT nasal spray Place 1 spray into both nostrils daily as needed. 48 g 2  . GARLIC PO Take 443 mg by mouth daily.    Marland Kitchen ibuprofen (ADVIL,MOTRIN) 800 MG tablet Take 1 tablet (800 mg total) by mouth every 8 (eight) hours as needed. 30 tablet 0  . Multiple Vitamins-Minerals (ALIVE ONCE DAILY WOMENS 50+) TABS Take 1 tablet by mouth daily.    . Omega-3 1000 MG CAPS Take 1 capsule by mouth daily.    . Probiotic Product (PROBIOTIC-10) CAPS Take 1 capsule by mouth daily.    . Turmeric Curcumin 500 MG CAPS Take 1 capsule by mouth daily.    . vitamin E 400 UNIT capsule Take 400 Units by mouth daily.     No current facility-administered medications on file prior to visit.     PAST MEDICAL HISTORY: Past Medical History:  Diagnosis Date  . Allergic rhinitis   . Allergy   . Arthritis   . Arthritis of low back   . Back pain   . Cataract   . Cataracts, bilateral   . Constipation   . Dry skin    on hands  . Fibroid uterus   . Hyperlipidemia   . Joint pain   . Osteoarthritis of right shoulder   . Ringing in ears   . Sciatica    sciatica  . Vitamin D deficiency     PAST SURGICAL HISTORY: Past Surgical History:   Procedure Laterality Date  . ABDOMINAL HYSTERECTOMY    . broken ankle    . CARPAL TUNNEL RELEASE Bilateral   . SHOULDER SURGERY Right     SOCIAL HISTORY: Social History   Tobacco Use  . Smoking status: Former Smoker    Packs/day: 1.00    Years: 29.00    Pack years: 29.00    Types: Cigarettes  . Smokeless tobacco: Never Used  . Tobacco comment: 30 years  Substance Use Topics  . Alcohol use: No    Frequency: Never  . Drug use: No    FAMILY HISTORY: Family History  Problem Relation Age of Onset  . Colon cancer Sister   . Colon polyps Sister   . Diabetes  Mother        father and 9 siblings  . Kidney disease Mother        renal failure  . Hypertension Mother   . Hyperlipidemia Mother   . Stroke Mother   . Depression Mother   . Anxiety disorder Mother   . Obesity Mother   . Heart disease Father        paternal grandmother  . Obesity Father   . Diabetes Father   . Hypertension Father   . Hyperlipidemia Father   . Eating disorder Father   . Esophageal cancer Neg Hx   . Rectal cancer Neg Hx   . Stomach cancer Neg Hx     ROS: Review of Systems  Constitutional: Positive for malaise/fatigue. Negative for weight loss.  Gastrointestinal: Negative for nausea and vomiting.  Musculoskeletal:       Negative for muscle weakness.  Endo/Heme/Allergies:       Negative for hypoglycemia.    PHYSICAL EXAM: Blood pressure 127/80, pulse 68, height 5\' 6"  (1.676 m), weight 204 lb (92.5 kg), SpO2 99 %. Body mass index is 32.93 kg/m. Physical Exam Vitals signs reviewed.  Constitutional:      Appearance: Normal appearance. She is obese.  Cardiovascular:     Rate and Rhythm: Normal rate.  Pulmonary:     Effort: Pulmonary effort is normal.  Musculoskeletal: Normal range of motion.  Skin:    General: Skin is warm and dry.  Neurological:     Mental Status: She is alert and oriented to person, place, and time.  Psychiatric:        Mood and Affect: Mood normal.         Behavior: Behavior normal.     RECENT LABS AND TESTS: BMET    Component Value Date/Time   NA 138 06/12/2018 1024   K 4.2 06/12/2018 1024   CL 102 06/12/2018 1024   CO2 24 06/12/2018 1024   GLUCOSE 82 06/12/2018 1024   GLUCOSE 95 05/29/2018 0920   BUN 10 06/12/2018 1024   CREATININE 0.70 06/12/2018 1024   CALCIUM 9.4 06/12/2018 1024   GFRNONAA 93 06/12/2018 1024   GFRAA 107 06/12/2018 1024   Lab Results  Component Value Date   HGBA1C 5.6 06/12/2018   Lab Results  Component Value Date   INSULIN 8.5 06/12/2018   CBC    Component Value Date/Time   WBC 4.6 06/12/2018 1024   WBC 3.9 (L) 05/21/2017 0920   RBC 4.93 06/12/2018 1024   RBC 4.63 05/21/2017 0920   HGB 13.5 06/12/2018 1024   HCT 42.0 06/12/2018 1024   PLT 232.0 05/21/2017 0920   MCV 85 06/12/2018 1024   MCH 27.4 06/12/2018 1024   MCHC 32.1 06/12/2018 1024   MCHC 33.1 05/21/2017 0920   RDW 13.5 06/12/2018 1024   LYMPHSABS 1.8 06/12/2018 1024   MONOABS 0.3 05/21/2017 0920   EOSABS 0.1 06/12/2018 1024   BASOSABS 0.0 06/12/2018 1024   Iron/TIBC/Ferritin/ %Sat No results found for: IRON, TIBC, FERRITIN, IRONPCTSAT Lipid Panel     Component Value Date/Time   CHOL 232 (H) 05/29/2018 0920   TRIG 56.0 05/29/2018 0920   HDL 86.80 05/29/2018 0920   CHOLHDL 3 05/29/2018 0920   VLDL 11.2 05/29/2018 0920   LDLCALC 134 (H) 05/29/2018 0920   LDLDIRECT 158.6 08/18/2013 0803   Hepatic Function Panel     Component Value Date/Time   PROT 7.4 06/12/2018 1024   ALBUMIN 4.4 06/12/2018 1024   AST 26 06/12/2018  1024   ALT 21 06/12/2018 1024   ALKPHOS 79 06/12/2018 1024   BILITOT 0.2 06/12/2018 1024      Component Value Date/Time   TSH 1.790 06/12/2018 1024   TSH 2.19 05/21/2017 0920   TSH 1.28 01/10/2016 1030   Results for HONOR, FRISON (MRN [id number]) as of 10/27/2018 14:02  Ref. Range 06/12/2018 10:24  Vitamin D, 25-Hydroxy Latest Ref Range: 30.0 - 100.0 ng/mL 36.1   OBESITY BEHAVIORAL  INTERVENTION VISIT  Today's visit was # 7   Starting weight: 212 lbs Starting date: 06/12/18 Today's weight : Weight: 204 lb (92.5 kg)  Today's date: 10/27/2018 Total lbs lost to date: 8    10/27/2018  Height 5\' 6"  (1.676 m)  Weight 204 lb (92.5 kg)  BMI (Calculated) 32.94  BLOOD PRESSURE - SYSTOLIC 638  BLOOD PRESSURE - DIASTOLIC 80   Body Fat % 46.6 %  Total Body Water (lbs) 77.8 lbs   ASK: We discussed the diagnosis of obesity with Jillian [name] today and Jillian [name] agreed to give [place] permission to discuss obesity behavioral modification therapy today.  ASSESS: Jillian [name] has the diagnosis of obesity and her BMI today is 32.94. Jillian [name] is in the action stage of change.   ADVISE: Jillian [name] was educated on the multiple health risks of obesity as well as the benefit of weight loss to improve her health. She was advised of the need for long term treatment and the importance of lifestyle modifications to improve her current health and to decrease her risk of future health problems.  AGREE: Multiple dietary modification options and treatment options were discussed and Jillian [name] agreed to follow the recommendations documented in the above note.  ARRANGE: Jillian [name] was educated on the importance of frequent visits to treat obesity as outlined per CMS and USPSTF guidelines and agreed to schedule her next follow up appointment today.  IMarcille Blanco, CMA, am acting as transcriptionist for Starlyn Skeans, MD  I have reviewed the above documentation for accuracy and completeness, and I agree with the above. -Dennard Nip, MD

## 2018-10-28 ENCOUNTER — Other Ambulatory Visit (INDEPENDENT_AMBULATORY_CARE_PROVIDER_SITE_OTHER): Payer: Self-pay | Admitting: Family Medicine

## 2018-10-28 DIAGNOSIS — R7303 Prediabetes: Secondary | ICD-10-CM

## 2018-10-28 LAB — COMPREHENSIVE METABOLIC PANEL
ALT: 19 IU/L (ref 0–32)
AST: 22 IU/L (ref 0–40)
Albumin/Globulin Ratio: 1.5 (ref 1.2–2.2)
Albumin: 4.3 g/dL (ref 3.8–4.8)
Alkaline Phosphatase: 61 IU/L (ref 39–117)
BUN/Creatinine Ratio: 18 (ref 12–28)
BUN: 14 mg/dL (ref 8–27)
Bilirubin Total: 0.3 mg/dL (ref 0.0–1.2)
CO2: 23 mmol/L (ref 20–29)
Calcium: 9.4 mg/dL (ref 8.7–10.3)
Chloride: 106 mmol/L (ref 96–106)
Creatinine, Ser: 0.77 mg/dL (ref 0.57–1.00)
GFR calc non Af Amer: 83 mL/min/{1.73_m2} (ref >59)
GFR, EST AFRICAN AMERICAN: 96 mL/min/{1.73_m2} (ref >59)
Globulin, Total: 2.8 g/dL (ref 1.5–4.5)
Glucose: 95 mg/dL (ref 65–99)
Potassium: 4.9 mmol/L (ref 3.5–5.2)
Sodium: 142 mmol/L (ref 134–144)
Total Protein: 7.1 g/dL (ref 6.0–8.5)

## 2018-10-28 LAB — CBC WITH DIFFERENTIAL
BASOS: 1 %
Basophils Absolute: 0 10*3/uL (ref 0.0–0.2)
EOS (ABSOLUTE): 0.1 10*3/uL (ref 0.0–0.4)
Eos: 3 %
Hematocrit: 41.6 % (ref 34.0–46.6)
Hemoglobin: 13.5 g/dL (ref 11.1–15.9)
IMMATURE GRANS (ABS): 0 10*3/uL (ref 0.0–0.1)
Immature Granulocytes: 0 %
Lymphocytes Absolute: 1.8 10*3/uL (ref 0.7–3.1)
Lymphs: 43 %
MCH: 29 pg (ref 26.6–33.0)
MCHC: 32.5 g/dL (ref 31.5–35.7)
MCV: 89 fL (ref 79–97)
Monocytes Absolute: 0.4 10*3/uL (ref 0.1–0.9)
Monocytes: 9 %
Neutrophils Absolute: 1.8 10*3/uL (ref 1.4–7.0)
Neutrophils: 44 %
RBC: 4.66 x10E6/uL (ref 3.77–5.28)
RDW: 13.4 % (ref 11.7–15.4)
WBC: 4.1 10*3/uL (ref 3.4–10.8)

## 2018-10-28 LAB — INSULIN, RANDOM: INSULIN: 7.4 u[IU]/mL (ref 2.6–24.9)

## 2018-10-28 LAB — LIPID PANEL WITH LDL/HDL RATIO
Cholesterol, Total: 248 mg/dL — ABNORMAL HIGH (ref 100–199)
HDL: 89 mg/dL (ref >39)
LDL CALC: 150 mg/dL — AB (ref 0–99)
LDl/HDL Ratio: 1.7 ratio (ref 0.0–3.2)
Triglycerides: 46 mg/dL (ref 0–149)
VLDL Cholesterol Cal: 9 mg/dL (ref 5–40)

## 2018-10-28 LAB — VITAMIN B12: Vitamin B-12: >2000 pg/mL — ABNORMAL HIGH (ref 232–1245)

## 2018-10-28 LAB — HEMOGLOBIN A1C
Est. average glucose Bld gHb Est-mCnc: 114 mg/dL
HEMOGLOBIN A1C: 5.6 % (ref 4.8–5.6)

## 2018-10-28 LAB — VITAMIN D 25 HYDROXY (VIT D DEFICIENCY, FRACTURES): Vit D, 25-Hydroxy: 45.8 ng/mL (ref 30.0–100.0)

## 2018-11-11 ENCOUNTER — Encounter (INDEPENDENT_AMBULATORY_CARE_PROVIDER_SITE_OTHER): Payer: Self-pay

## 2018-11-12 ENCOUNTER — Encounter (INDEPENDENT_AMBULATORY_CARE_PROVIDER_SITE_OTHER): Payer: Self-pay

## 2018-11-17 ENCOUNTER — Encounter (INDEPENDENT_AMBULATORY_CARE_PROVIDER_SITE_OTHER): Payer: Self-pay | Admitting: Family Medicine

## 2018-11-17 ENCOUNTER — Ambulatory Visit (INDEPENDENT_AMBULATORY_CARE_PROVIDER_SITE_OTHER): Admitting: Family Medicine

## 2018-11-17 ENCOUNTER — Other Ambulatory Visit: Payer: Self-pay

## 2018-11-17 DIAGNOSIS — E669 Obesity, unspecified: Secondary | ICD-10-CM

## 2018-11-17 DIAGNOSIS — Z6832 Body mass index (BMI) 32.0-32.9, adult: Secondary | ICD-10-CM | POA: Diagnosis not present

## 2018-11-17 DIAGNOSIS — E559 Vitamin D deficiency, unspecified: Secondary | ICD-10-CM | POA: Diagnosis not present

## 2018-11-17 DIAGNOSIS — R7303 Prediabetes: Secondary | ICD-10-CM

## 2018-11-17 MED ORDER — VITAMIN D (ERGOCALCIFEROL) 1.25 MG (50000 UNIT) PO CAPS: 50000.0000 [IU] | ORAL_CAPSULE | ORAL | 0 refills | Status: DC

## 2018-11-17 MED ORDER — METFORMIN HCL 500 MG PO TABS: 500.0000 mg | ORAL_TABLET | Freq: Every day | ORAL | 0 refills | Status: DC

## 2018-11-18 NOTE — Progress Notes (Signed)
Office: [phone]  /  Fax: [phone] TeleHealth Visit:  Jillian [name] has consented to this TeleHealth visit today via Skype. The patient is located at home, the provider is located at the [facility] office. The participants in this visit include the listed provider, patient, and patient's husband, Delfino Lovett, may walk in.   HPI:   Chief Complaint: OBESITY Ilanna is here to discuss her progress with her obesity treatment plan. She states she is on the Category 2 plan and is following her eating plan approximately 85% of the time. She states she has been doing yard work. Tyrica states she has been struggling with increased temptations at home. She thinks she has still lost approximately 1 lb. She reports skipping meals and increasing carbs snacking more in the last 2 weeks. She states she is mulching her garden and trees for exercise.  We were unable to weigh the patient today for this TeleHealth visit.She feels as if she has lost weight since her last visit. She has lost 8 lbs since starting treatment with [place].  Pre-Diabetes Bettyjean has a diagnosis of prediabetes based on her elevated Hgb A1c and was informed this puts her at greater risk of developing diabetes. We reviewed her labs today. Kindal remains controlled with decreased polyphagia unless she increases simple carbs. She is taking metformin currently and continues to work on diet and exercise to decrease risk of diabetes. She denies nausea, vomiting, or hypoglycemia.  Vitamin D deficiency Abagale has a diagnosis of Vitamin D deficiency, which is improving but is not yet at goal. She is currently taking prescription Vit D and denies nausea, vomiting or muscle weakness.  ASSESSMENT AND PLAN:  Prediabetes - Plan: metFORMIN (GLUCOPHAGE) 500 MG tablet  Vitamin D deficiency - Plan: Vitamin D, Ergocalciferol, (DRISDOL) 1.25 MG (50000 UT) CAPS capsule  Class 1 obesity with serious comorbidity and body mass  index (BMI) of 32.0 to 32.9 in adult, unspecified obesity type  PLAN:  Pre-Diabetes Ardie will continue to work on weight loss, exercise, and decreasing simple carbohydrates in her diet to help decrease the risk of diabetes. We dicussed metformin including benefits and risks. She was informed that eating too many simple carbohydrates or too many calories at one sitting increases the likelihood of GI side effects. Azucena is currently taking metformin and a refill prescription was written today for 500 mg #30 with 0 refills. Kary agrees to follow-up with our clinic in 4 weeks.  Vitamin D Deficiency Seher was informed that low Vitamin D levels contributes to fatigue and are associated with obesity, breast, and colon cancer. She agrees to continue to take prescription Vit D @ 50,000 IU every week #4 with 0 refills and will follow-up for routine testing of Vitamin D, at least 2-3 times per year. She was informed of the risk of over-replacement of Vitamin D and agrees to not increase her dose unless she discusses this with [place] first. Jazleen agrees to follow-up with our clinic in 4 weeks.  Obesity Adore is currently in the action stage of change. As such, her goal is to continue with weight loss efforts. She has agreed to follow the Category 2 plan. Shantai has been instructed to work up to a goal of 150 minutes of combined cardio and strengthening exercise per week for weight loss and overall health benefits. We discussed the following Behavioral Modification Strategies today: increasing lean protein intake, decreasing simple carbohydrates , increasing vegetables, no skipping meals, work on meal planning and easy  cooking plans, keeping healthy foods in the home, and better snacking choices.  Analisa has agreed to follow-up with our clinic in 4 weeks. She was informed of the importance of frequent follow-up visits to maximize her success with intensive lifestyle modifications for her multiple  health conditions.  ALLERGIES: No Known Allergies  MEDICATIONS: Current Outpatient Medications on File Prior to Visit  Medication Sig Dispense Refill  . Ascorbic Acid (VITAMIN C) 1000 MG tablet Take 1,000 mg by mouth daily.    Marland Kitchen aspirin EC 81 MG tablet Take 81 mg by mouth daily.    Marland Kitchen CALCIUM-MAGNESIUM-ZINC PO Take 1 capsule by mouth daily.    . cetirizine (ZYRTEC) 10 MG tablet Take 1 tablet (10 mg total) by mouth daily. For allergies. 90 tablet 1  . Coenzyme Q10 (CO Q 10) 100 MG CAPS Take 1 capsule by mouth daily.    . Cyanocobalamin (VITAMIN B 12 PO) Take 1 capsule by mouth daily.     Marland Kitchen estradiol (VIVELLE-DOT) 0.025 MG/24HR PLACE 1 PATCH ONTO THE SKIN 2 TIMES A WEEK. 24 patch 1  . Fluocinonide Emulsified Base 0.05 % CREA APPLY  CREAM EXTERNALLY TWICE DAILY TO  AFFECTED  AREA 60 g 0  . fluticasone (FLONASE) 50 MCG/ACT nasal spray Place 1 spray into both nostrils daily as needed. 48 g 2  . GARLIC PO Take 341 mg by mouth daily.    Marland Kitchen ibuprofen (ADVIL,MOTRIN) 800 MG tablet Take 1 tablet (800 mg total) by mouth every 8 (eight) hours as needed. 30 tablet 0  . Multiple Vitamins-Minerals (ALIVE ONCE DAILY WOMENS 50+) TABS Take 1 tablet by mouth daily.    . Omega-3 1000 MG CAPS Take 1 capsule by mouth daily.    . Probiotic Product (PROBIOTIC-10) CAPS Take 1 capsule by mouth daily.    . Turmeric Curcumin 500 MG CAPS Take 1 capsule by mouth daily.    . vitamin E 400 UNIT capsule Take 400 Units by mouth daily.     No current facility-administered medications on file prior to visit.     PAST MEDICAL HISTORY: Past Medical History:  Diagnosis Date  . Allergic rhinitis   . Allergy   . Arthritis   . Arthritis of low back   . Back pain   . Cataract   . Cataracts, bilateral   . Constipation   . Dry skin    on hands  . Fibroid uterus   . Hyperlipidemia   . Joint pain   . Osteoarthritis of right shoulder   . Ringing in ears   . Sciatica    sciatica  . Vitamin D deficiency     PAST  SURGICAL HISTORY: Past Surgical History:  Procedure Laterality Date  . ABDOMINAL HYSTERECTOMY    . broken ankle    . CARPAL TUNNEL RELEASE Bilateral   . SHOULDER SURGERY Right     SOCIAL HISTORY: Social History   Tobacco Use  . Smoking status: Former Smoker    Packs/day: 1.00    Years: 29.00    Pack years: 29.00    Types: Cigarettes  . Smokeless tobacco: Never Used  . Tobacco comment: 30 years  Substance Use Topics  . Alcohol use: No    Frequency: Never  . Drug use: No    FAMILY HISTORY: Family History  Problem Relation Age of Onset  . Colon cancer Sister   . Colon polyps Sister   . Diabetes Mother        father and 9 siblings  .  Kidney disease Mother        renal failure  . Hypertension Mother   . Hyperlipidemia Mother   . Stroke Mother   . Depression Mother   . Anxiety disorder Mother   . Obesity Mother   . Heart disease Father        paternal grandmother  . Obesity Father   . Diabetes Father   . Hypertension Father   . Hyperlipidemia Father   . Eating disorder Father   . Esophageal cancer Neg Hx   . Rectal cancer Neg Hx   . Stomach cancer Neg Hx    ROS: Review of Systems  Gastrointestinal: Negative for nausea and vomiting.  Musculoskeletal:       Negative for muscle weakness.  Endo/Heme/Allergies:       Negative for hypoglycemia.   PHYSICAL EXAM: Pt in no acute distress  RECENT LABS AND TESTS: BMET    Component Value Date/Time   NA 142 10/27/2018 1327   K 4.9 10/27/2018 1327   CL 106 10/27/2018 1327   CO2 23 10/27/2018 1327   GLUCOSE 95 10/27/2018 1327   GLUCOSE 95 05/29/2018 0920   BUN 14 10/27/2018 1327   CREATININE 0.77 10/27/2018 1327   CALCIUM 9.4 10/27/2018 1327   GFRNONAA 83 10/27/2018 1327   GFRAA 96 10/27/2018 1327   Lab Results  Component Value Date   HGBA1C 5.6 10/27/2018   HGBA1C 5.6 06/12/2018   Lab Results  Component Value Date   INSULIN 7.4 10/27/2018   INSULIN 8.5 06/12/2018   CBC    Component Value  Date/Time   WBC 4.1 10/27/2018 1327   WBC 3.9 (L) 05/21/2017 0920   RBC 4.66 10/27/2018 1327   RBC 4.63 05/21/2017 0920   HGB 13.5 10/27/2018 1327   HCT 41.6 10/27/2018 1327   PLT 232.0 05/21/2017 0920   MCV 89 10/27/2018 1327   MCH 29.0 10/27/2018 1327   MCHC 32.5 10/27/2018 1327   MCHC 33.1 05/21/2017 0920   RDW 13.4 10/27/2018 1327   LYMPHSABS 1.8 10/27/2018 1327   MONOABS 0.3 05/21/2017 0920   EOSABS 0.1 10/27/2018 1327   BASOSABS 0.0 10/27/2018 1327   Iron/TIBC/Ferritin/ %Sat No results found for: IRON, TIBC, FERRITIN, IRONPCTSAT Lipid Panel     Component Value Date/Time   CHOL 248 (H) 10/27/2018 1327   TRIG 46 10/27/2018 1327   HDL 89 10/27/2018 1327   CHOLHDL 3 05/29/2018 0920   VLDL 11.2 05/29/2018 0920   LDLCALC 150 (H) 10/27/2018 1327   LDLDIRECT 158.6 08/18/2013 0803   Hepatic Function Panel     Component Value Date/Time   PROT 7.1 10/27/2018 1327   ALBUMIN 4.3 10/27/2018 1327   AST 22 10/27/2018 1327   ALT 19 10/27/2018 1327   ALKPHOS 61 10/27/2018 1327   BILITOT 0.3 10/27/2018 1327      Component Value Date/Time   TSH 1.790 06/12/2018 1024   TSH 2.19 05/21/2017 0920   TSH 1.28 01/10/2016 1030   Results for DAVEAH, VARONE (MRN [id number]) as of 11/18/2018 08:47  Ref. Range 10/27/2018 13:27  Vitamin D, 25-Hydroxy Latest Ref Range: 30.0 - 100.0 ng/mL 45.8    I, Michaelene Song, am acting as Location manager for Dennard Nip, MD  I have reviewed the above documentation for accuracy and completeness, and I agree with the above. -Dennard Nip, MD

## 2018-12-15 ENCOUNTER — Encounter (INDEPENDENT_AMBULATORY_CARE_PROVIDER_SITE_OTHER): Payer: Self-pay | Admitting: Family Medicine

## 2018-12-15 ENCOUNTER — Ambulatory Visit (INDEPENDENT_AMBULATORY_CARE_PROVIDER_SITE_OTHER): Admitting: Family Medicine

## 2018-12-15 ENCOUNTER — Other Ambulatory Visit: Payer: Self-pay

## 2018-12-15 DIAGNOSIS — R7303 Prediabetes: Secondary | ICD-10-CM

## 2018-12-15 DIAGNOSIS — E669 Obesity, unspecified: Secondary | ICD-10-CM

## 2018-12-15 DIAGNOSIS — Z6832 Body mass index (BMI) 32.0-32.9, adult: Secondary | ICD-10-CM

## 2018-12-15 DIAGNOSIS — E559 Vitamin D deficiency, unspecified: Secondary | ICD-10-CM | POA: Diagnosis not present

## 2018-12-15 DIAGNOSIS — F3289 Other specified depressive episodes: Secondary | ICD-10-CM

## 2018-12-15 MED ORDER — BUPROPION HCL ER (SR) 150 MG PO TB12: 150.0000 mg | ORAL_TABLET | Freq: Every day | ORAL | 0 refills | Status: DC

## 2018-12-15 MED ORDER — METFORMIN HCL 500 MG PO TABS: 500.0000 mg | ORAL_TABLET | Freq: Every day | ORAL | 0 refills | Status: DC

## 2018-12-15 MED ORDER — VITAMIN D (ERGOCALCIFEROL) 1.25 MG (50000 UNIT) PO CAPS: 50000.0000 [IU] | ORAL_CAPSULE | ORAL | 0 refills | Status: DC

## 2018-12-15 NOTE — Progress Notes (Signed)
Office: [phone]  /  Fax: [phone] TeleHealth Visit:  Jillian [name] has verbally consented to this TeleHealth visit today. The patient is located at home, the provider is located at the [facility] office. The participants in this visit include the listed provider and patient. Jillian [name] was unable to use realtime audiovisual technology today and the telehealth visit was conducted via telephone.  HPI:   Chief Complaint: OBESITY Jillian [name] is here to discuss her progress with her obesity treatment plan. She is on the Category 2 plan and is following her eating plan approximately 50 % of the time. She states she is exercising 0 minutes 0 times per week. Jillian [name] feels like she has gained 1 pound. She is not on her normal routine and meal planning has fallen by the wayside. She has been doing more drive through eating and boredom snacking, especially in the last 2 weeks.  We were unable to weigh the patient today for this TeleHealth visit. She feels as if she has gained weight since her last visit. She has lost 8 lbs since starting treatment with [place].  Pre-Diabetes Jillian [name] has a diagnosis of pre-diabetes based on her elevated Hgb A1c and was informed this puts her at greater risk of developing diabetes. She is stable on metformin currently, but is struggling more with her eating plan. She is snacking on increased simple carbs and decreased protein and vegetables. She continues to work on diet and exercise to decrease risk of diabetes.  Depression with emotional eating behaviors Jillian [name] is struggling with emotional eating and using food for comfort to the extent that it is negatively impacting her health. She notes increased frustration and reduced sleep. This has been worsening since the start of the COVID19 isolation. She often snacks when she is not hungry. Jillian [name] sometimes feels she is out of control and then feels guilty that she made poor food choices. She has been  working on behavior modification techniques to help reduce her emotional eating and has been somewhat successful. She shows no sign of suicidal or homicidal ideations.  ASSESSMENT AND PLAN:  Prediabetes - Plan: metFORMIN (GLUCOPHAGE) 500 MG tablet  Vitamin D deficiency - Plan: Vitamin D, Ergocalciferol, (DRISDOL) 1.25 MG (50000 UT) CAPS capsule  Other depression - with emotional eating - Plan: buPROPion (WELLBUTRIN SR) 150 MG 12 hr tablet  Class 1 obesity with serious comorbidity and body mass index (BMI) of 32.0 to 32.9 in adult, unspecified obesity type  PLAN:  Pre-Diabetes Jillian [name] will continue to work on weight loss, exercise, and decreasing simple carbohydrates in her diet to help decrease the risk of diabetes. She was informed that eating too many simple carbohydrates or too many calories at one sitting increases the likelihood of GI side effects. [name] agreed to continue her diet, exercise, and metformin 500 mg qAM #30 with no refills and a prescription was written today. [name] agreed to follow up with [place] as directed to monitor her progress in 2 weeks.   Vitamin D Deficiency Jillian [name] was informed that low vitamin D levels contribute to fatigue and are associated with obesity, breast, and colon cancer. Jillian [name] agrees to continue to take prescription Vit D @50 ,000 IU every week #4 with no refills and will follow up for routine testing of vitamin D, at least 2-3 times per year. She was informed of the risk of over-replacement of vitamin D and agrees to not increase her dose unless she discusses this with [place] first. Jillian [name] agrees to follow up in  2 weeks as directed.  Depression with Emotional Eating Behaviors We discussed behavior modification techniques today to help Jillian [name] deal with her emotional eating and depression. She has agreed to start Wellbutrin SR 150 mg qAM #30 with no refills and she will continue to check her blood pressure at home. Jillian [name] agreed to follow up as directed.   Obesity Jillian [name] is currently in the action stage of change. As such, her goal is to continue with weight loss efforts. She has agreed to follow the Category 2 plan. Jillian [name] has been instructed to work up to a goal of 150 minutes of combined cardio and strengthening exercise per week for weight loss and overall health benefits. We discussed the following Behavioral Modification Strategies today: emotional eating strategies, better snacking choices, and ways to avoid boredom eating.  Jillian [name] has agreed to follow up with our clinic in 2 weeks. She was informed of the importance of frequent follow up visits to maximize her success with intensive lifestyle modifications for her multiple health conditions.  ALLERGIES: No Known Allergies  MEDICATIONS: Current Outpatient Medications on File Prior to Visit  Medication Sig Dispense Refill  . Ascorbic Acid (VITAMIN C) 1000 MG tablet Take 1,000 mg by mouth daily.    Marland Kitchen aspirin EC 81 MG tablet Take 81 mg by mouth daily.    Marland Kitchen CALCIUM-MAGNESIUM-ZINC PO Take 1 capsule by mouth daily.    . cetirizine (ZYRTEC) 10 MG tablet Take 1 tablet (10 mg total) by mouth daily. For allergies. 90 tablet 1  . Coenzyme Q10 (CO Q 10) 100 MG CAPS Take 1 capsule by mouth daily.    . Cyanocobalamin (VITAMIN B 12 PO) Take 1 capsule by mouth daily.     Marland Kitchen estradiol (VIVELLE-DOT) 0.025 MG/24HR PLACE 1 PATCH ONTO THE SKIN 2 TIMES A WEEK. 24 patch 1  . Fluocinonide Emulsified Base 0.05 % CREA APPLY  CREAM EXTERNALLY TWICE DAILY TO  AFFECTED  AREA 60 g 0  . fluticasone (FLONASE) 50 MCG/ACT nasal spray Place 1 spray into both nostrils daily as needed. 48 g 2  . GARLIC PO Take 628 mg by mouth daily.    Marland Kitchen ibuprofen (ADVIL,MOTRIN) 800 MG tablet Take 1 tablet (800 mg total) by mouth every 8 (eight) hours as needed. 30 tablet 0  . metFORMIN (GLUCOPHAGE) 500 MG tablet Take 1 tablet (500 mg total) by mouth daily with breakfast. 30 tablet 0  . Multiple Vitamins-Minerals (ALIVE ONCE DAILY  WOMENS 50+) TABS Take 1 tablet by mouth daily.    . Omega-3 1000 MG CAPS Take 1 capsule by mouth daily.    . Probiotic Product (PROBIOTIC-10) CAPS Take 1 capsule by mouth daily.    . Turmeric Curcumin 500 MG CAPS Take 1 capsule by mouth daily.    . Vitamin D, Ergocalciferol, (DRISDOL) 1.25 MG (50000 UT) CAPS capsule Take 1 capsule (50,000 Units total) by mouth every 7 (seven) days. 4 capsule 0  . vitamin E 400 UNIT capsule Take 400 Units by mouth daily.     No current facility-administered medications on file prior to visit.     PAST MEDICAL HISTORY: Past Medical History:  Diagnosis Date  . Allergic rhinitis   . Allergy   . Arthritis   . Arthritis of low back   . Back pain   . Cataract   . Cataracts, bilateral   . Constipation   . Dry skin    on hands  . Fibroid uterus   . Hyperlipidemia   . Joint pain   .  Osteoarthritis of right shoulder   . Ringing in ears   . Sciatica    sciatica  . Vitamin D deficiency     PAST SURGICAL HISTORY: Past Surgical History:  Procedure Laterality Date  . ABDOMINAL HYSTERECTOMY    . broken ankle    . CARPAL TUNNEL RELEASE Bilateral   . SHOULDER SURGERY Right     SOCIAL HISTORY: Social History   Tobacco Use  . Smoking status: Former Smoker    Packs/day: 1.00    Years: 29.00    Pack years: 29.00    Types: Cigarettes  . Smokeless tobacco: Never Used  . Tobacco comment: 30 years  Substance Use Topics  . Alcohol use: No    Frequency: Never  . Drug use: No    FAMILY HISTORY: Family History  Problem Relation Age of Onset  . Colon cancer Sister   . Colon polyps Sister   . Diabetes Mother        father and 9 siblings  . Kidney disease Mother        renal failure  . Hypertension Mother   . Hyperlipidemia Mother   . Stroke Mother   . Depression Mother   . Anxiety disorder Mother   . Obesity Mother   . Heart disease Father        paternal grandmother  . Obesity Father   . Diabetes Father   . Hypertension Father   .  Hyperlipidemia Father   . Eating disorder Father   . Esophageal cancer Neg Hx   . Rectal cancer Neg Hx   . Stomach cancer Neg Hx     ROS: Review of Systems  Gastrointestinal: Negative for nausea and vomiting.  Musculoskeletal:       Negative for muscle weakness.    PHYSICAL EXAM: Pt in no acute distress  RECENT LABS AND TESTS: BMET    Component Value Date/Time   NA 142 10/27/2018 1327   K 4.9 10/27/2018 1327   CL 106 10/27/2018 1327   CO2 23 10/27/2018 1327   GLUCOSE 95 10/27/2018 1327   GLUCOSE 95 05/29/2018 0920   BUN 14 10/27/2018 1327   CREATININE 0.77 10/27/2018 1327   CALCIUM 9.4 10/27/2018 1327   GFRNONAA 83 10/27/2018 1327   GFRAA 96 10/27/2018 1327   Lab Results  Component Value Date   HGBA1C 5.6 10/27/2018   HGBA1C 5.6 06/12/2018   Lab Results  Component Value Date   INSULIN 7.4 10/27/2018   INSULIN 8.5 06/12/2018   CBC    Component Value Date/Time   WBC 4.1 10/27/2018 1327   WBC 3.9 (L) 05/21/2017 0920   RBC 4.66 10/27/2018 1327   RBC 4.63 05/21/2017 0920   HGB 13.5 10/27/2018 1327   HCT 41.6 10/27/2018 1327   PLT 232.0 05/21/2017 0920   MCV 89 10/27/2018 1327   MCH 29.0 10/27/2018 1327   MCHC 32.5 10/27/2018 1327   MCHC 33.1 05/21/2017 0920   RDW 13.4 10/27/2018 1327   LYMPHSABS 1.8 10/27/2018 1327   MONOABS 0.3 05/21/2017 0920   EOSABS 0.1 10/27/2018 1327   BASOSABS 0.0 10/27/2018 1327   Iron/TIBC/Ferritin/ %Sat No results found for: IRON, TIBC, FERRITIN, IRONPCTSAT Lipid Panel     Component Value Date/Time   CHOL 248 (H) 10/27/2018 1327   TRIG 46 10/27/2018 1327   HDL 89 10/27/2018 1327   CHOLHDL 3 05/29/2018 0920   VLDL 11.2 05/29/2018 0920   LDLCALC 150 (H) 10/27/2018 1327   LDLDIRECT 158.6 08/18/2013 0803  Hepatic Function Panel     Component Value Date/Time   PROT 7.1 10/27/2018 1327   ALBUMIN 4.3 10/27/2018 1327   AST 22 10/27/2018 1327   ALT 19 10/27/2018 1327   ALKPHOS 61 10/27/2018 1327   BILITOT 0.3 10/27/2018  1327      Component Value Date/Time   TSH 1.790 06/12/2018 1024   TSH 2.19 05/21/2017 0920   TSH 1.28 01/10/2016 1030    Results for RYEN, HEITMEYER (MRN [id number]) as of 12/15/2018 11:14  Ref. Range 10/27/2018 13:27  Vitamin D, 25-Hydroxy Latest Ref Range: 30.0 - 100.0 ng/mL 45.8    I, Marcille Blanco, CMA, am acting as transcriptionist for Starlyn Skeans, MD I have reviewed the above documentation for accuracy and completeness, and I agree with the above. -Dennard Nip, MD

## 2018-12-25 ENCOUNTER — Other Ambulatory Visit: Payer: Self-pay | Admitting: Primary Care

## 2018-12-25 DIAGNOSIS — J309 Allergic rhinitis, unspecified: Secondary | ICD-10-CM

## 2018-12-29 ENCOUNTER — Encounter (INDEPENDENT_AMBULATORY_CARE_PROVIDER_SITE_OTHER): Payer: Self-pay | Admitting: Family Medicine

## 2018-12-29 ENCOUNTER — Ambulatory Visit (INDEPENDENT_AMBULATORY_CARE_PROVIDER_SITE_OTHER): Admitting: Family Medicine

## 2018-12-29 ENCOUNTER — Other Ambulatory Visit: Payer: Self-pay

## 2018-12-29 DIAGNOSIS — R7303 Prediabetes: Secondary | ICD-10-CM

## 2018-12-29 DIAGNOSIS — F3289 Other specified depressive episodes: Secondary | ICD-10-CM

## 2018-12-29 DIAGNOSIS — E669 Obesity, unspecified: Secondary | ICD-10-CM

## 2018-12-29 DIAGNOSIS — Z6832 Body mass index (BMI) 32.0-32.9, adult: Secondary | ICD-10-CM | POA: Diagnosis not present

## 2018-12-29 MED ORDER — BUPROPION HCL ER (SR) 150 MG PO TB12: 150.0000 mg | ORAL_TABLET | Freq: Every day | ORAL | 0 refills | Status: DC

## 2018-12-29 MED ORDER — METFORMIN HCL 500 MG PO TABS: 500.0000 mg | ORAL_TABLET | Freq: Every day | ORAL | 0 refills | Status: DC

## 2018-12-30 NOTE — Progress Notes (Signed)
**Note De-Identified Jillian [name] Obfuscation** Office: [phone]  /  Fax: [phone] TeleHealth Visit:  Jillian [name] has verbally consented to this TeleHealth visit today. The patient is located at home, the provider is located at the [facility] office. The participants in this visit include the listed provider and patient. The visit was conducted today Jillian [name] Webex.  HPI:   Chief Complaint: OBESITY Jillian [name] is here to discuss her progress with her obesity treatment plan. She is on the Category 2 plan and is following her eating plan approximately 75 % of the time. She states she is doing yard work. Jillian [name] feels that she is doing well on her diet prescription and is doing better with reduced emotional eating. She has lost 1 pound since her last visit 2 weeks ago. Her hunger is controlled, but she is warned about impending meat shortages and how to deal with this.  We were unable to weigh the patient today for this TeleHealth visit. She feels as if she has lost weight since her last visit. She has lost 8 lbs since starting treatment with [place].  Depression with emotional eating behaviors Jillian [name] started Wellbutrin and feels it may be starting to help with some of her emotional eating and using food for comfort to the extent that it is negatively impacting her health. Jillian [name] states her blood pressure at home is not elevated. She often snacks when she is not hungry. Jillian [name] sometimes feels she is out of control and then feels guilty that she made poor food choices. She has been working on behavior modification techniques to help reduce her emotional eating and has been somewhat successful.   Pre-Diabetes Jillian [name] has a diagnosis of pre-diabetes based on her elevated Hgb A1c and was informed this puts her at greater risk of developing diabetes. She is stable on metformin currently and is working on diet and exercise to decrease risk of diabetes. She denies nausea, vomiting, or hypoglycemia.   ASSESSMENT AND PLAN:   Prediabetes - Plan: metFORMIN (GLUCOPHAGE) 500 MG tablet  Other depression - with emotional eating - Plan: buPROPion (WELLBUTRIN SR) 150 MG 12 hr tablet  Class 1 obesity with serious comorbidity and body mass index (BMI) of 32.0 to 32.9 in adult, unspecified obesity type  PLAN:  Pre-Diabetes Jillian [name] will continue to work on weight loss, exercise, and decreasing simple carbohydrates in her diet to help decrease the risk of diabetes. She was informed that eating too many simple carbohydrates or too many calories at one sitting increases the likelihood of GI side effects. Jillian [name] agreed to continue metformin 500 mg qAM #30 with no refills and a prescription was written today. Jillian [name] agreed to follow up with [place] as directed to monitor her progress in 2 weeks.   Depression with Emotional Eating Behaviors We discussed behavior modification techniques today to help Jillian [name] deal with her emotional eating and depression. She has agreed to take Wellbutrin SR 150 mg qd #30 with no refills and agreed to follow up as directed in 2 weeks.  Obesity Jillian [name] is currently in the action stage of change. As such, her goal is to continue with weight loss efforts. She has agreed to follow the Category 2 plan. Jillian [name] has been instructed to work up to a goal of 150 minutes of combined cardio and strengthening exercise per week for weight loss and overall health benefits. We discussed the following Behavioral Modification Strategies today: increasing lean protein intake, work on meal planning and easy cooking plans, and emotional eating strategies. We discussed lean  meat equivalents.  Jillian [name] has agreed to follow up with our clinic in 2 weeks. She was informed of the importance of frequent follow up visits to maximize her success with intensive lifestyle modifications for her multiple health conditions.  ALLERGIES: No Known Allergies  MEDICATIONS: Current Outpatient Medications on File Prior to Visit   Medication Sig Dispense Refill  . Ascorbic Acid (VITAMIN C) 1000 MG tablet Take 1,000 mg by mouth daily.    Marland Kitchen aspirin EC 81 MG tablet Take 81 mg by mouth daily.    Marland Kitchen CALCIUM-MAGNESIUM-ZINC PO Take 1 capsule by mouth daily.    . cetirizine (ZYRTEC) 10 MG tablet TAKE 1 TABLET BY MOUTH EVERY DAY 90 tablet 1  . Coenzyme Q10 (CO Q 10) 100 MG CAPS Take 1 capsule by mouth daily.    . Cyanocobalamin (VITAMIN B 12 PO) Take 1 capsule by mouth daily.     Marland Kitchen estradiol (VIVELLE-DOT) 0.025 MG/24HR PLACE 1 PATCH ONTO THE SKIN 2 TIMES A WEEK. 24 patch 1  . Fluocinonide Emulsified Base 0.05 % CREA APPLY  CREAM EXTERNALLY TWICE DAILY TO  AFFECTED  AREA 60 g 0  . fluticasone (FLONASE) 50 MCG/ACT nasal spray Place 1 spray into both nostrils daily as needed. 48 g 2  . GARLIC PO Take 841 mg by mouth daily.    Marland Kitchen ibuprofen (ADVIL,MOTRIN) 800 MG tablet Take 1 tablet (800 mg total) by mouth every 8 (eight) hours as needed. 30 tablet 0  . Multiple Vitamins-Minerals (ALIVE ONCE DAILY WOMENS 50+) TABS Take 1 tablet by mouth daily.    . Omega-3 1000 MG CAPS Take 1 capsule by mouth daily.    . Probiotic Product (PROBIOTIC-10) CAPS Take 1 capsule by mouth daily.    . Turmeric Curcumin 500 MG CAPS Take 1 capsule by mouth daily.    . Vitamin D, Ergocalciferol, (DRISDOL) 1.25 MG (50000 UT) CAPS capsule Take 1 capsule (50,000 Units total) by mouth every 7 (seven) days. 4 capsule 0  . vitamin E 400 UNIT capsule Take 400 Units by mouth daily.     No current facility-administered medications on file prior to visit.     PAST MEDICAL HISTORY: Past Medical History:  Diagnosis Date  . Allergic rhinitis   . Allergy   . Arthritis   . Arthritis of low back   . Back pain   . Cataract   . Cataracts, bilateral   . Constipation   . Dry skin    on hands  . Fibroid uterus   . Hyperlipidemia   . Joint pain   . Osteoarthritis of right shoulder   . Ringing in ears   . Sciatica    sciatica  . Vitamin D deficiency     PAST  SURGICAL HISTORY: Past Surgical History:  Procedure Laterality Date  . ABDOMINAL HYSTERECTOMY    . broken ankle    . CARPAL TUNNEL RELEASE Bilateral   . SHOULDER SURGERY Right     SOCIAL HISTORY: Social History   Tobacco Use  . Smoking status: Former Smoker    Packs/day: 1.00    Years: 29.00    Pack years: 29.00    Types: Cigarettes  . Smokeless tobacco: Never Used  . Tobacco comment: 30 years  Substance Use Topics  . Alcohol use: No    Frequency: Never  . Drug use: No    FAMILY HISTORY: Family History  Problem Relation Age of Onset  . Colon cancer Sister   . Colon polyps Sister   .  Diabetes Mother        father and 9 siblings  . Kidney disease Mother        renal failure  . Hypertension Mother   . Hyperlipidemia Mother   . Stroke Mother   . Depression Mother   . Anxiety disorder Mother   . Obesity Mother   . Heart disease Father        paternal grandmother  . Obesity Father   . Diabetes Father   . Hypertension Father   . Hyperlipidemia Father   . Eating disorder Father   . Esophageal cancer Neg Hx   . Rectal cancer Neg Hx   . Stomach cancer Neg Hx     ROS: Review of Systems  Cardiovascular: Negative for palpitations.  Gastrointestinal: Negative for nausea and vomiting.  Endo/Heme/Allergies:       Negative for hypoglycemia.  Psychiatric/Behavioral: The patient does not have insomnia.     PHYSICAL EXAM: Pt in no acute distress  RECENT LABS AND TESTS: BMET    Component Value Date/Time   NA 142 10/27/2018 1327   K 4.9 10/27/2018 1327   CL 106 10/27/2018 1327   CO2 23 10/27/2018 1327   GLUCOSE 95 10/27/2018 1327   GLUCOSE 95 05/29/2018 0920   BUN 14 10/27/2018 1327   CREATININE 0.77 10/27/2018 1327   CALCIUM 9.4 10/27/2018 1327   GFRNONAA 83 10/27/2018 1327   GFRAA 96 10/27/2018 1327   Lab Results  Component Value Date   HGBA1C 5.6 10/27/2018   HGBA1C 5.6 06/12/2018   Lab Results  Component Value Date   INSULIN 7.4 10/27/2018    INSULIN 8.5 06/12/2018   CBC    Component Value Date/Time   WBC 4.1 10/27/2018 1327   WBC 3.9 (L) 05/21/2017 0920   RBC 4.66 10/27/2018 1327   RBC 4.63 05/21/2017 0920   HGB 13.5 10/27/2018 1327   HCT 41.6 10/27/2018 1327   PLT 232.0 05/21/2017 0920   MCV 89 10/27/2018 1327   MCH 29.0 10/27/2018 1327   MCHC 32.5 10/27/2018 1327   MCHC 33.1 05/21/2017 0920   RDW 13.4 10/27/2018 1327   LYMPHSABS 1.8 10/27/2018 1327   MONOABS 0.3 05/21/2017 0920   EOSABS 0.1 10/27/2018 1327   BASOSABS 0.0 10/27/2018 1327   Iron/TIBC/Ferritin/ %Sat No results found for: IRON, TIBC, FERRITIN, IRONPCTSAT Lipid Panel     Component Value Date/Time   CHOL 248 (H) 10/27/2018 1327   TRIG 46 10/27/2018 1327   HDL 89 10/27/2018 1327   CHOLHDL 3 05/29/2018 0920   VLDL 11.2 05/29/2018 0920   LDLCALC 150 (H) 10/27/2018 1327   LDLDIRECT 158.6 08/18/2013 0803   Hepatic Function Panel     Component Value Date/Time   PROT 7.1 10/27/2018 1327   ALBUMIN 4.3 10/27/2018 1327   AST 22 10/27/2018 1327   ALT 19 10/27/2018 1327   ALKPHOS 61 10/27/2018 1327   BILITOT 0.3 10/27/2018 1327      Component Value Date/Time   TSH 1.790 06/12/2018 1024   TSH 2.19 05/21/2017 0920   TSH 1.28 01/10/2016 1030   Results for MAXENE, BYINGTON (MRN [id number]) as of 12/30/2018 10:14  Ref. Range 10/27/2018 13:27  Vitamin D, 25-Hydroxy Latest Ref Range: 30.0 - 100.0 ng/mL 45.8     I, Marcille Blanco, CMA, am acting as transcriptionist for Starlyn Skeans, MD I have reviewed the above documentation for accuracy and completeness, and I agree with the above. -Dennard Nip, MD

## 2019-01-10 ENCOUNTER — Other Ambulatory Visit (INDEPENDENT_AMBULATORY_CARE_PROVIDER_SITE_OTHER): Payer: Self-pay | Admitting: Family Medicine

## 2019-01-10 DIAGNOSIS — E559 Vitamin D deficiency, unspecified: Secondary | ICD-10-CM

## 2019-01-14 ENCOUNTER — Other Ambulatory Visit: Payer: Self-pay

## 2019-01-14 ENCOUNTER — Ambulatory Visit (INDEPENDENT_AMBULATORY_CARE_PROVIDER_SITE_OTHER): Admitting: Family Medicine

## 2019-01-14 ENCOUNTER — Encounter (INDEPENDENT_AMBULATORY_CARE_PROVIDER_SITE_OTHER): Payer: Self-pay | Admitting: Family Medicine

## 2019-01-14 DIAGNOSIS — R7303 Prediabetes: Secondary | ICD-10-CM

## 2019-01-14 DIAGNOSIS — F3289 Other specified depressive episodes: Secondary | ICD-10-CM

## 2019-01-14 DIAGNOSIS — E559 Vitamin D deficiency, unspecified: Secondary | ICD-10-CM

## 2019-01-14 DIAGNOSIS — Z6832 Body mass index (BMI) 32.0-32.9, adult: Secondary | ICD-10-CM

## 2019-01-14 DIAGNOSIS — E669 Obesity, unspecified: Secondary | ICD-10-CM

## 2019-01-14 MED ORDER — METFORMIN HCL 500 MG PO TABS: 500.0000 mg | ORAL_TABLET | Freq: Every day | ORAL | 0 refills | Status: DC

## 2019-01-14 MED ORDER — VITAMIN D (ERGOCALCIFEROL) 1.25 MG (50000 UNIT) PO CAPS: 50000.0000 [IU] | ORAL_CAPSULE | ORAL | 0 refills | Status: DC

## 2019-01-14 MED ORDER — BUPROPION HCL ER (SR) 200 MG PO TB12: 200.0000 mg | ORAL_TABLET | Freq: Every day | ORAL | 0 refills | Status: DC

## 2019-01-14 NOTE — Progress Notes (Signed)
Office: [phone]  /  Fax: [phone] TeleHealth Visit:  Jillian [name] has verbally consented to this TeleHealth visit today. The patient is located at home, the provider is located at the [facility] office. The participants in this visit include the listed provider and patient. The visit was conducted today via Webex.  HPI:   Chief Complaint: OBESITY Jillian [name] is here to discuss her progress with her obesity treatment plan. She is on the Category 2 plan and is following her eating plan approximately 75 % of the time. She states she is walking more and gardening. Jillian [name] has done better controlling her emotional eating and has done well maintaining her weight. Her hunger is controlled and she is satisfied with her Category 2 plan.  We were unable to weigh the patient today for this TeleHealth visit. She feels as if she has maintained weight since her last visit. She has lost 8 lbs since starting treatment with [place].  Depression with emotional eating behaviors Jillian [name] started Wellbutrin and feels it has helped her mood and has helped her avoid emotional eating. She is struggling however, and asks if we can increase the dose. She is struggling with emotional eating and using food for comfort to the extent that it is negatively impacting her health. She often snacks when she is not hungry. [name] sometimes feels she is out of control and then feels guilty that she made poor food choices. She has been working on behavior modification techniques to help reduce her emotional eating and has been somewhat successful. She denies insomnia and palpitations.  Pre-Diabetes Jillian [name] has a diagnosis of pre-diabetes based on her elevated Hgb A1c and was informed this puts her at greater risk of developing diabetes. She is stable on metformin currently and notes decreased polyphagia. Jillian [name] continues to work on diet and exercise to decrease risk of diabetes. She denies nausea, vomiting,  or hypoglycemia.   Vitamin D Deficiency Jillian [name] has a diagnosis of vitamin D deficiency. She is currently stable on vit D. Jillian [name] denies nausea, vomiting, or muscle weakness.  ASSESSMENT AND PLAN:  Prediabetes - Plan: metFORMIN (GLUCOPHAGE) 500 MG tablet  Vitamin D deficiency - Plan: Vitamin D, Ergocalciferol, (DRISDOL) 1.25 MG (50000 UT) CAPS capsule  Other depression - with emotional eating - Plan: buPROPion (WELLBUTRIN SR) 200 MG 12 hr tablet  Class 1 obesity with serious comorbidity and body mass index (BMI) of 32.0 to 32.9 in adult, unspecified obesity type  PLAN:  Pre-Diabetes Jillian [name] will continue to work on weight loss, exercise, and decreasing simple carbohydrates in her diet to help decrease the risk of diabetes. She was informed that eating too many simple carbohydrates or too many calories at one sitting increases the likelihood of GI side effects. Jillian [name] agreed to continue metformin 500 mg qAM #30 with no refills and a prescription was written today. Jillian [name] agreed to follow up with [place] as directed to monitor her progress in 3 weeks.   Vitamin D Deficiency Jillian [name] was informed that low vitamin D levels contribute to fatigue and are associated with obesity, breast, and colon cancer. Jillian [name] agrees to continue to take prescription Vit D @50 ,000 IU every week #4 with no refills and will follow up for routine testing of vitamin D, at least 2-3 times per year. She was informed of the risk of over-replacement of vitamin D and agrees to not increase her dose unless she discusses this with [place] first. Jillian [name] agrees to follow up in 3 weeks as directed.  Depression with Emotional Eating Behaviors We discussed behavior modification techniques today to help Jillian [name] deal with her emotional eating and depression. She has agreed to increase Wellbutrin SR 200 mg qAM #30 with no refills and agreed to follow up as directed.  Obesity Jillian [name] is currently in the action stage of change. As such,  her goal is to continue with weight loss efforts. She has agreed to follow the Category 2 plan. Jillian [name] has been instructed to work up to a goal of 150 minutes of combined cardio and strengthening exercise per week for weight loss and overall health benefits. We discussed the following Behavioral Modification Strategies today: work on meal planning and easy cooking plans, keeping healthy foods in the home, planning for success, and emotional eating strategies.  Jillian [name] has agreed to follow up with our clinic in 3 weeks. She was informed of the importance of frequent follow up visits to maximize her success with intensive lifestyle modifications for her multiple health conditions.  ALLERGIES: No Known Allergies  MEDICATIONS: Current Outpatient Medications on File Prior to Visit  Medication Sig Dispense Refill  . Ascorbic Acid (VITAMIN C) 1000 MG tablet Take 1,000 mg by mouth daily.    Marland Kitchen aspirin EC 81 MG tablet Take 81 mg by mouth daily.    Marland Kitchen CALCIUM-MAGNESIUM-ZINC PO Take 1 capsule by mouth daily.    . cetirizine (ZYRTEC) 10 MG tablet TAKE 1 TABLET BY MOUTH EVERY DAY 90 tablet 1  . Coenzyme Q10 (CO Q 10) 100 MG CAPS Take 1 capsule by mouth daily.    . Cyanocobalamin (VITAMIN B 12 PO) Take 1 capsule by mouth daily.     Marland Kitchen estradiol (VIVELLE-DOT) 0.025 MG/24HR PLACE 1 PATCH ONTO THE SKIN 2 TIMES A WEEK. 24 patch 1  . Fluocinonide Emulsified Base 0.05 % CREA APPLY  CREAM EXTERNALLY TWICE DAILY TO  AFFECTED  AREA 60 g 0  . fluticasone (FLONASE) 50 MCG/ACT nasal spray Place 1 spray into both nostrils daily as needed. 48 g 2  . GARLIC PO Take 633 mg by mouth daily.    Marland Kitchen ibuprofen (ADVIL,MOTRIN) 800 MG tablet Take 1 tablet (800 mg total) by mouth every 8 (eight) hours as needed. 30 tablet 0  . Multiple Vitamins-Minerals (ALIVE ONCE DAILY WOMENS 50+) TABS Take 1 tablet by mouth daily.    . Omega-3 1000 MG CAPS Take 1 capsule by mouth daily.    . Probiotic Product (PROBIOTIC-10) CAPS Take 1 capsule by  mouth daily.    . Turmeric Curcumin 500 MG CAPS Take 1 capsule by mouth daily.    . vitamin E 400 UNIT capsule Take 400 Units by mouth daily.     No current facility-administered medications on file prior to visit.     PAST MEDICAL HISTORY: Past Medical History:  Diagnosis Date  . Allergic rhinitis   . Allergy   . Arthritis   . Arthritis of low back   . Back pain   . Cataract   . Cataracts, bilateral   . Constipation   . Dry skin    on hands  . Fibroid uterus   . Hyperlipidemia   . Joint pain   . Osteoarthritis of right shoulder   . Ringing in ears   . Sciatica    sciatica  . Vitamin D deficiency     PAST SURGICAL HISTORY: Past Surgical History:  Procedure Laterality Date  . ABDOMINAL HYSTERECTOMY    . broken ankle    . CARPAL TUNNEL RELEASE Bilateral   .  SHOULDER SURGERY Right     SOCIAL HISTORY: Social History   Tobacco Use  . Smoking status: Former Smoker    Packs/day: 1.00    Years: 29.00    Pack years: 29.00    Types: Cigarettes  . Smokeless tobacco: Never Used  . Tobacco comment: 30 years  Substance Use Topics  . Alcohol use: No    Frequency: Never  . Drug use: No    FAMILY HISTORY: Family History  Problem Relation Age of Onset  . Colon cancer Sister   . Colon polyps Sister   . Diabetes Mother        father and 9 siblings  . Kidney disease Mother        renal failure  . Hypertension Mother   . Hyperlipidemia Mother   . Stroke Mother   . Depression Mother   . Anxiety disorder Mother   . Obesity Mother   . Heart disease Father        paternal grandmother  . Obesity Father   . Diabetes Father   . Hypertension Father   . Hyperlipidemia Father   . Eating disorder Father   . Esophageal cancer Neg Hx   . Rectal cancer Neg Hx   . Stomach cancer Neg Hx     ROS: Review of Systems  Cardiovascular: Negative for palpitations.  Gastrointestinal: Negative for nausea and vomiting.  Musculoskeletal:       Negative for muscle weakness.   Endo/Heme/Allergies:       Negative for hypoglycemia.  Psychiatric/Behavioral: Positive for depression. The patient does not have insomnia.     PHYSICAL EXAM: Pt in no acute distress  RECENT LABS AND TESTS: BMET    Component Value Date/Time   NA 142 10/27/2018 1327   K 4.9 10/27/2018 1327   CL 106 10/27/2018 1327   CO2 23 10/27/2018 1327   GLUCOSE 95 10/27/2018 1327   GLUCOSE 95 05/29/2018 0920   BUN 14 10/27/2018 1327   CREATININE 0.77 10/27/2018 1327   CALCIUM 9.4 10/27/2018 1327   GFRNONAA 83 10/27/2018 1327   GFRAA 96 10/27/2018 1327   Lab Results  Component Value Date   HGBA1C 5.6 10/27/2018   HGBA1C 5.6 06/12/2018   Lab Results  Component Value Date   INSULIN 7.4 10/27/2018   INSULIN 8.5 06/12/2018   CBC    Component Value Date/Time   WBC 4.1 10/27/2018 1327   WBC 3.9 (L) 05/21/2017 0920   RBC 4.66 10/27/2018 1327   RBC 4.63 05/21/2017 0920   HGB 13.5 10/27/2018 1327   HCT 41.6 10/27/2018 1327   PLT 232.0 05/21/2017 0920   MCV 89 10/27/2018 1327   MCH 29.0 10/27/2018 1327   MCHC 32.5 10/27/2018 1327   MCHC 33.1 05/21/2017 0920   RDW 13.4 10/27/2018 1327   LYMPHSABS 1.8 10/27/2018 1327   MONOABS 0.3 05/21/2017 0920   EOSABS 0.1 10/27/2018 1327   BASOSABS 0.0 10/27/2018 1327   Iron/TIBC/Ferritin/ %Sat No results found for: IRON, TIBC, FERRITIN, IRONPCTSAT Lipid Panel     Component Value Date/Time   CHOL 248 (H) 10/27/2018 1327   TRIG 46 10/27/2018 1327   HDL 89 10/27/2018 1327   CHOLHDL 3 05/29/2018 0920   VLDL 11.2 05/29/2018 0920   LDLCALC 150 (H) 10/27/2018 1327   LDLDIRECT 158.6 08/18/2013 0803   Hepatic Function Panel     Component Value Date/Time   PROT 7.1 10/27/2018 1327   ALBUMIN 4.3 10/27/2018 1327   AST 22 10/27/2018 1327  ALT 19 10/27/2018 1327   ALKPHOS 61 10/27/2018 1327   BILITOT 0.3 10/27/2018 1327      Component Value Date/Time   TSH 1.790 06/12/2018 1024   TSH 2.19 05/21/2017 0920   TSH 1.28 01/10/2016 1030    Results for LAQUASIA, PINCUS (MRN [id number]) as of 01/14/2019 15:28  Ref. Range 10/27/2018 13:27  Vitamin D, 25-Hydroxy Latest Ref Range: 30.0 - 100.0 ng/mL 45.8     I, Marcille Blanco, CMA, am acting as transcriptionist for Starlyn Skeans, MD I have reviewed the above documentation for accuracy and completeness, and I agree with the above. -Dennard Nip, MD

## 2019-02-03 ENCOUNTER — Encounter (INDEPENDENT_AMBULATORY_CARE_PROVIDER_SITE_OTHER): Payer: Self-pay | Admitting: Family Medicine

## 2019-02-03 ENCOUNTER — Ambulatory Visit (INDEPENDENT_AMBULATORY_CARE_PROVIDER_SITE_OTHER): Admitting: Family Medicine

## 2019-02-05 ENCOUNTER — Other Ambulatory Visit: Payer: Self-pay

## 2019-02-05 ENCOUNTER — Encounter: Payer: Self-pay | Admitting: Primary Care

## 2019-02-05 ENCOUNTER — Ambulatory Visit (INDEPENDENT_AMBULATORY_CARE_PROVIDER_SITE_OTHER): Admitting: Primary Care

## 2019-02-05 DIAGNOSIS — F329 Major depressive disorder, single episode, unspecified: Secondary | ICD-10-CM

## 2019-02-05 DIAGNOSIS — E559 Vitamin D deficiency, unspecified: Secondary | ICD-10-CM

## 2019-02-05 DIAGNOSIS — F3289 Other specified depressive episodes: Secondary | ICD-10-CM | POA: Diagnosis not present

## 2019-02-05 DIAGNOSIS — F32A Depression, unspecified: Secondary | ICD-10-CM

## 2019-02-05 DIAGNOSIS — N952 Postmenopausal atrophic vaginitis: Secondary | ICD-10-CM

## 2019-02-05 DIAGNOSIS — R7303 Prediabetes: Secondary | ICD-10-CM | POA: Diagnosis not present

## 2019-02-05 MED ORDER — METFORMIN HCL 500 MG PO TABS: 500.0000 mg | ORAL_TABLET | Freq: Every day | ORAL | 0 refills | Status: DC

## 2019-02-05 MED ORDER — VITAMIN D (ERGOCALCIFEROL) 1.25 MG (50000 UNIT) PO CAPS: 50000.0000 [IU] | ORAL_CAPSULE | ORAL | 0 refills | Status: DC

## 2019-02-05 MED ORDER — BUPROPION HCL ER (SR) 200 MG PO TB12: 200.0000 mg | ORAL_TABLET | Freq: Every day | ORAL | 0 refills | Status: DC

## 2019-02-05 NOTE — Assessment & Plan Note (Signed)
Doing well on bupropion SR 200 mg once daily, denies SI/HI. Continue same. Refills sent to pharmacy. We will see her back in the office in October 2020 for PHQ 9.

## 2019-02-05 NOTE — Assessment & Plan Note (Signed)
Last several A1C levels of 5.6 which is within normal range. Compliant to metformin 500 mg daily and is needing refills. Will refill and then plan to see her back in the office for labs in October 2020.

## 2019-02-05 NOTE — Assessment & Plan Note (Signed)
Using estradiol patch once weekly now, encouraged her to try every other week as our goal would be to discontinue. She will try and update.

## 2019-02-05 NOTE — Progress Notes (Signed)
Subjective:    Patient ID: Jillian [name], female    DOB: July 29, 1956, 63 y.o.   MRN: [id number]  HPI  Virtual Visit via Video Note  I connected with Jillian [name] on 02/05/19 at 11:20 AM EDT by a video enabled telemedicine application and verified that I am speaking with the correct person using two identifiers.  Location: Patient: Home Provider: Office   I discussed the limitations of evaluation and management by telemedicine and the availability of in person appointments. The patient expressed understanding and agreed to proceed.  History of Present Illness:  Jillian [name] is a 63 year old female who presents today for follow up of chronic conditions. She's been following with Dr. Leafy Ro for weight loss management but can no longer afford to attend. She is requesting refills of medications initiated by their clinic.  1) Depression: Currently managed on Wellbutrin SR 200 mg once daily which was initially initiated for depression with emotional eating behaviors. During her last follow up with Dr. Leafy Ro she endorsed improvement in mood and reduction in emotional eating.   Since her last visit with Dr. Leafy Ro she continues to do well. She feels as though the medication has helped with depression during Covid-19. She is needing a refill. Denies SI/HI.  2) Prediabetes: Currently managed on metformin 500 mg every morning. Her last A1C was 5.6 in March 2020, prior to that her A1C was 5.6 in October 2019. She is compliant and is needing refills.   3) Vitamin D Deficiency: Currently managed on 50,000 unit capsules once weekly. Her last vitamin D level was 45 in March 2020. She is compliant and is needing refills.    Observations/Objective:  Alert and oriented. Appears well, not sickly. No distress. Speaking in complete sentences.   Assessment and Plan:  See problem based charting.  Follow Up Instructions:  I sent refills of your medications to your  pharmacy.  Please schedule a physical with me for October/November 2020. You may also schedule a lab only appointment 3-4 days prior. We will discuss your lab results in detail during your physical.  It was a pleasure to see you today! Allie Bossier, NP-C    I discussed the assessment and treatment plan with the patient. The patient was provided an opportunity to ask questions and all were answered. The patient agreed with the plan and demonstrated an understanding of the instructions.   The patient was advised to call back or seek an in-person evaluation if the symptoms worsen or if the condition fails to improve as anticipated.     Pleas Koch, NP    Review of Systems  Respiratory: Negative for shortness of breath.   Cardiovascular: Negative for chest pain.  Neurological: Negative for dizziness and headaches.  Psychiatric/Behavioral: Negative for suicidal ideas.       Past Medical History:  Diagnosis Date  . Allergic rhinitis   . Allergy   . Arthritis   . Arthritis of low back   . Back pain   . Cataract   . Cataracts, bilateral   . Constipation   . Dry skin    on hands  . Fibroid uterus   . Hyperlipidemia   . Joint pain   . Osteoarthritis of right shoulder   . Ringing in ears   . Sciatica    sciatica  . Vitamin D deficiency      Social History   Socioeconomic History  . Marital status: Married    Spouse name: Jillian [name]  .  Number of children: 0  . Years of education: Not on file  . Highest education level: Not on file  Occupational History  . Occupation: Retired [employer]  Social Needs  . Financial resource strain: Not on file  . Food insecurity    Worry: Not on file    Inability: Not on file  . Transportation needs    Medical: Not on file    Non-medical: Not on file  Tobacco Use  . Smoking status: Former Smoker    Packs/day: 1.00    Years: 29.00    Pack years: 29.00    Types: Cigarettes  . Smokeless tobacco: Never Used  .  Tobacco comment: 30 years  Substance and Sexual Activity  . Alcohol use: No    Frequency: Never  . Drug use: No  . Sexual activity: Not on file  Lifestyle  . Physical activity    Days per week: Not on file    Minutes per session: Not on file  . Stress: Not on file  Relationships  . Social Herbalist on phone: Not on file    Gets together: Not on file    Attends religious service: Not on file    Active member of club or organization: Not on file    Attends meetings of clubs or organizations: Not on file    Relationship status: Not on file  . Intimate partner violence    Fear of current or ex partner: Not on file    Emotionally abused: Not on file    Physically abused: Not on file    Forced sexual activity: Not on file  Other Topics Concern  . Not on file  Social History Narrative   ** Merged History Encounter **        Past Surgical History:  Procedure Laterality Date  . ABDOMINAL HYSTERECTOMY    . broken ankle    . CARPAL TUNNEL RELEASE Bilateral   . SHOULDER SURGERY Right     Family History  Problem Relation Age of Onset  . Colon cancer Sister   . Colon polyps Sister   . Diabetes Mother        father and 9 siblings  . Kidney disease Mother        renal failure  . Hypertension Mother   . Hyperlipidemia Mother   . Stroke Mother   . Depression Mother   . Anxiety disorder Mother   . Obesity Mother   . Heart disease Father        paternal grandmother  . Obesity Father   . Diabetes Father   . Hypertension Father   . Hyperlipidemia Father   . Eating disorder Father   . Esophageal cancer Neg Hx   . Rectal cancer Neg Hx   . Stomach cancer Neg Hx     No Known Allergies  Current Outpatient Medications on File Prior to Visit  Medication Sig Dispense Refill  . Ascorbic Acid (VITAMIN C) 1000 MG tablet Take 1,000 mg by mouth daily.    Marland Kitchen aspirin EC 81 MG tablet Take 81 mg by mouth daily.    Marland Kitchen buPROPion (WELLBUTRIN SR) 200 MG 12 hr tablet Take 1 tablet  (200 mg total) by mouth daily. 30 tablet 0  . CALCIUM-MAGNESIUM-ZINC PO Take 1 capsule by mouth daily.    . cetirizine (ZYRTEC) 10 MG tablet TAKE 1 TABLET BY MOUTH EVERY DAY 90 tablet 1  . Coenzyme Q10 (CO Q 10) 100 MG CAPS  Take 1 capsule by mouth daily.    . Cyanocobalamin (VITAMIN B 12 PO) Take 1 capsule by mouth daily.     Marland Kitchen estradiol (VIVELLE-DOT) 0.025 MG/24HR PLACE 1 PATCH ONTO THE SKIN 2 TIMES A WEEK. 24 patch 1  . Fluocinonide Emulsified Base 0.05 % CREA APPLY  CREAM EXTERNALLY TWICE DAILY TO  AFFECTED  AREA 60 g 0  . fluticasone (FLONASE) 50 MCG/ACT nasal spray Place 1 spray into both nostrils daily as needed. 48 g 2  . GARLIC PO Take 462 mg by mouth daily.    Marland Kitchen ibuprofen (ADVIL,MOTRIN) 800 MG tablet Take 1 tablet (800 mg total) by mouth every 8 (eight) hours as needed. 30 tablet 0  . metFORMIN (GLUCOPHAGE) 500 MG tablet Take 1 tablet (500 mg total) by mouth daily with breakfast. 30 tablet 0  . Multiple Vitamins-Minerals (ALIVE ONCE DAILY WOMENS 50+) TABS Take 1 tablet by mouth daily.    . Omega-3 1000 MG CAPS Take 1 capsule by mouth daily.    . Probiotic Product (PROBIOTIC-10) CAPS Take 1 capsule by mouth daily.    . Turmeric Curcumin 500 MG CAPS Take 1 capsule by mouth daily.    . Vitamin D, Ergocalciferol, (DRISDOL) 1.25 MG (50000 UT) CAPS capsule Take 1 capsule (50,000 Units total) by mouth every 7 (seven) days. 4 capsule 0  . vitamin E 400 UNIT capsule Take 400 Units by mouth daily.     No current facility-administered medications on file prior to visit.     BP 127/78   Wt 202 lb (91.6 kg)   BMI 32.60 kg/m    Objective:   Physical Exam  Constitutional: She is oriented to person, place, and time. She appears well-nourished.  Respiratory: Effort normal.  Neurological: She is alert and oriented to person, place, and time.  Psychiatric: She has a normal mood and affect.           Assessment & Plan:

## 2019-02-05 NOTE — Patient Instructions (Signed)
I sent refills of your medications to your pharmacy.  Please schedule a physical with me for October/November 2020. You may also schedule a lab only appointment 3-4 days prior. We will discuss your lab results in detail during your physical.  It was a pleasure to see you today! Allie Bossier, NP-C

## 2019-02-05 NOTE — Assessment & Plan Note (Signed)
Doing well on vitamin D weekly capsules. Repeat vitamin D in October 2020 during her annual physical.

## 2019-03-11 ENCOUNTER — Telehealth (INDEPENDENT_AMBULATORY_CARE_PROVIDER_SITE_OTHER): Admitting: Family Medicine

## 2019-03-25 ENCOUNTER — Telehealth (INDEPENDENT_AMBULATORY_CARE_PROVIDER_SITE_OTHER): Admitting: Family Medicine

## 2019-05-03 ENCOUNTER — Other Ambulatory Visit: Payer: Self-pay

## 2019-05-03 ENCOUNTER — Encounter (INDEPENDENT_AMBULATORY_CARE_PROVIDER_SITE_OTHER): Payer: Self-pay | Admitting: Family Medicine

## 2019-05-03 DIAGNOSIS — E559 Vitamin D deficiency, unspecified: Secondary | ICD-10-CM

## 2019-05-03 DIAGNOSIS — F3289 Other specified depressive episodes: Secondary | ICD-10-CM

## 2019-05-03 DIAGNOSIS — J309 Allergic rhinitis, unspecified: Secondary | ICD-10-CM

## 2019-05-03 DIAGNOSIS — N952 Postmenopausal atrophic vaginitis: Secondary | ICD-10-CM

## 2019-05-03 DIAGNOSIS — R7303 Prediabetes: Secondary | ICD-10-CM

## 2019-05-05 MED ORDER — METFORMIN HCL 500 MG PO TABS: 500.0000 mg | ORAL_TABLET | Freq: Every day | ORAL | 0 refills | Status: DC

## 2019-05-05 MED ORDER — ESTRADIOL 0.025 MG/24HR TD PTTW: MEDICATED_PATCH | TRANSDERMAL | 0 refills | Status: DC

## 2019-05-05 MED ORDER — BUPROPION HCL ER (SR) 200 MG PO TB12: 200.0000 mg | ORAL_TABLET | Freq: Every day | ORAL | 0 refills | Status: DC

## 2019-05-05 MED ORDER — FLUTICASONE PROPIONATE 50 MCG/ACT NA SUSP: 1.0000 | Freq: Every day | NASAL | 1 refills | Status: DC | PRN

## 2019-05-12 ENCOUNTER — Other Ambulatory Visit: Payer: Self-pay

## 2019-05-12 ENCOUNTER — Encounter (INDEPENDENT_AMBULATORY_CARE_PROVIDER_SITE_OTHER): Payer: Self-pay | Admitting: Family Medicine

## 2019-05-12 ENCOUNTER — Ambulatory Visit (INDEPENDENT_AMBULATORY_CARE_PROVIDER_SITE_OTHER): Admitting: Family Medicine

## 2019-05-12 VITALS — BP 156/82 | HR 69 | Temp 98.0°F | Ht 66.0 in | Wt 202.0 lb

## 2019-05-12 DIAGNOSIS — E785 Hyperlipidemia, unspecified: Secondary | ICD-10-CM | POA: Diagnosis not present

## 2019-05-12 DIAGNOSIS — E669 Obesity, unspecified: Secondary | ICD-10-CM

## 2019-05-12 DIAGNOSIS — R5383 Other fatigue: Secondary | ICD-10-CM

## 2019-05-12 DIAGNOSIS — E559 Vitamin D deficiency, unspecified: Secondary | ICD-10-CM

## 2019-05-12 DIAGNOSIS — E66811 Obesity, class 1: Secondary | ICD-10-CM

## 2019-05-12 DIAGNOSIS — Z6832 Body mass index (BMI) 32.0-32.9, adult: Secondary | ICD-10-CM

## 2019-05-12 DIAGNOSIS — Z9189 Other specified personal risk factors, not elsewhere classified: Secondary | ICD-10-CM

## 2019-05-12 MED ORDER — VITAMIN D (ERGOCALCIFEROL) 1.25 MG (50000 UNIT) PO CAPS: 50000.0000 [IU] | ORAL_CAPSULE | ORAL | 0 refills | Status: DC

## 2019-05-13 LAB — COMPREHENSIVE METABOLIC PANEL
ALT: 22 IU/L (ref 0–32)
AST: 23 IU/L (ref 0–40)
Albumin/Globulin Ratio: 1.5 (ref 1.2–2.2)
Albumin: 4.5 g/dL (ref 3.8–4.8)
Alkaline Phosphatase: 69 IU/L (ref 39–117)
BUN/Creatinine Ratio: 19 (ref 12–28)
BUN: 16 mg/dL (ref 8–27)
Bilirubin Total: 0.3 mg/dL (ref 0.0–1.2)
CO2: 23 mmol/L (ref 20–29)
Calcium: 9.2 mg/dL (ref 8.7–10.3)
Chloride: 103 mmol/L (ref 96–106)
Creatinine, Ser: 0.85 mg/dL (ref 0.57–1.00)
GFR calc Af Amer: 84 mL/min/{1.73_m2} (ref >59)
GFR calc non Af Amer: 73 mL/min/{1.73_m2} (ref >59)
Globulin, Total: 3.1 g/dL (ref 1.5–4.5)
Glucose: 98 mg/dL (ref 65–99)
Potassium: 4.3 mmol/L (ref 3.5–5.2)
Sodium: 139 mmol/L (ref 134–144)
Total Protein: 7.6 g/dL (ref 6.0–8.5)

## 2019-05-13 LAB — INSULIN, RANDOM: INSULIN: 10.8 u[IU]/mL (ref 2.6–24.9)

## 2019-05-13 LAB — T4, FREE: Free T4: 1.19 ng/dL (ref 0.82–1.77)

## 2019-05-13 LAB — HEMOGLOBIN A1C
Est. average glucose Bld gHb Est-mCnc: 111 mg/dL
Hgb A1c MFr Bld: 5.5 % (ref 4.8–5.6)

## 2019-05-13 LAB — LIPID PANEL WITH LDL/HDL RATIO
Cholesterol, Total: 250 mg/dL — ABNORMAL HIGH (ref 100–199)
HDL: 91 mg/dL (ref >39)
LDL Chol Calc (NIH): 151 mg/dL — ABNORMAL HIGH (ref 0–99)
LDL/HDL Ratio: 1.7 ratio (ref 0.0–3.2)
Triglycerides: 53 mg/dL (ref 0–149)
VLDL Cholesterol Cal: 8 mg/dL (ref 5–40)

## 2019-05-13 LAB — T3: T3, Total: 102 ng/dL (ref 71–180)

## 2019-05-13 LAB — TSH: TSH: 2.71 u[IU]/mL (ref 0.450–4.500)

## 2019-05-13 NOTE — Progress Notes (Signed)
Office: [phone]  /  Fax: [phone]   HPI:   Chief Complaint: OBESITY Jillian [name] is here to discuss her progress with her obesity treatment plan. She is on the Category 2 plan and is following her eating plan approximately 50% of the time. She states she is walking/exercising on elliptical 15 minutes 3-4 times per week. Kaitlain continues to do well with weight loss. She is deviating from her plan more. She has increased her vegetables and activity. Her weight is 202 lb (91.6 kg) today and has had a weight loss of 2 pounds over a period of 6 months since her last in-office visit. She has lost 10 lbs since starting treatment with [place].  Vitamin D deficiency Jillian [name] has a diagnosis of Vitamin D deficiency. She is currently stable on prescription Vit D and denies nausea, vomiting or muscle weakness. She is due to have labs done.  At risk for osteopenia and osteoporosis Jillian [name] is at higher risk of osteopenia and osteoporosis due to Vitamin D deficiency.   Fatigue Jillian [name] notes her fatigue has improved but not fully resolved.  Hyperlipidemia Jillian [name] has hyperlipidemia and has been trying to improve her cholesterol levels with intensive lifestyle modification including a low saturated fat diet, exercise and weight loss. She denies any chest pain.  ASSESSMENT AND PLAN:  Vitamin D deficiency - Plan: Vitamin D, Ergocalciferol, (DRISDOL) 1.25 MG (50000 UT) CAPS capsule  Other fatigue - Plan: Comprehensive metabolic panel, Hemoglobin A1c, Insulin, random, T3, T4, free, TSH  Hyperlipidemia, unspecified hyperlipidemia type - Plan: Lipid Panel With LDL/HDL Ratio  At risk for osteoporosis  Class 1 obesity with serious comorbidity and body mass index (BMI) of 32.0 to 32.9 in adult, unspecified obesity type  PLAN:  Vitamin D Deficiency Jillian [name] was informed that low Vitamin D levels contributes to fatigue and are associated with obesity, breast, and colon cancer. She agrees to continue to  take prescription Vit D @ 50,000 IU every week #4 with 0 refills and will have routine testing of Vitamin D. She was informed of the risk of over-replacement of Vitamin D and agrees to not increase her dose unless she discusses this with [place] first. Jillian [name] agrees to follow-up with our clinic in 3-4 weeks.  At risk for osteopenia and osteoporosis Jillian [name] was given extended  (15 minutes) osteoporosis prevention counseling today. Jillian [name] is at risk for osteopenia and osteoporosis due to her Vitamin D deficiency. She was encouraged to take her Vitamin D and follow her higher calcium diet and increase strengthening exercise to help strengthen her bones and decrease her risk of osteopenia and osteoporosis.  Fatigue Jillian [name] was informed that her fatigue may be related to obesity, depression or many other causes. Labs will be ordered, and in the meanwhile Jillian [name] has agreed to work on diet, exercise and weight loss to help with fatigue. Proper sleep hygiene was discussed including the need for 7-8 hours of quality sleep each night.  Hyperlipidemia Jillian [name] was informed of the American Heart Association Guidelines emphasizing intensive lifestyle modifications as the first line treatment for hyperlipidemia. We discussed many lifestyle modifications today in depth, and Jillian [name] will continue to work on decreasing saturated fats such as fatty red meat, butter and many fried foods. She will have labs checked, increase vegetables and lean protein in her diet, and continue to work on exercise and weight loss efforts.  Obesity Jillian [name] is currently in the action stage of change. As such, her goal is to continue with weight loss efforts. She has agreed  to keep a food journal with 1200-1500 calories and 85+ grams of protein.  Jillian [name] has been instructed to work up to a goal of 150 minutes of combined cardio and strengthening exercise per week for weight loss and overall health benefits. We discussed the following  Behavioral Modification Strategies today: increasing lean protein intake, work on meal planning and easy cooking plans.  Jillian [name] has agreed to follow-up with our clinic in 3-4 weeks. She was informed of the importance of frequent follow-up visits to maximize her success with intensive lifestyle modifications for her multiple health conditions.  ALLERGIES: No Known Allergies  MEDICATIONS: Current Outpatient Medications on File Prior to Visit  Medication Sig Dispense Refill  . Ascorbic Acid (VITAMIN C) 1000 MG tablet Take 1,000 mg by mouth daily.    Marland Kitchen aspirin EC 81 MG tablet Take 81 mg by mouth daily.    Marland Kitchen buPROPion (WELLBUTRIN SR) 200 MG 12 hr tablet Take 1 tablet (200 mg total) by mouth daily. For depression. 90 tablet 0  . CALCIUM-MAGNESIUM-ZINC PO Take 1 capsule by mouth daily.    . cetirizine (ZYRTEC) 10 MG tablet TAKE 1 TABLET BY MOUTH EVERY DAY 90 tablet 1  . Coenzyme Q10 (CO Q 10) 100 MG CAPS Take 1 capsule by mouth daily.    . Cyanocobalamin (VITAMIN B 12 PO) Take 1 capsule by mouth daily.     . Fluocinonide Emulsified Base 0.05 % CREA APPLY  CREAM EXTERNALLY TWICE DAILY TO  AFFECTED  AREA 60 g 0  . fluticasone (FLONASE) 50 MCG/ACT nasal spray Place 1 spray into both nostrils daily as needed. 48 g 1  . GARLIC PO Take 0000000 mg by mouth daily.    Marland Kitchen ibuprofen (ADVIL,MOTRIN) 800 MG tablet Take 1 tablet (800 mg total) by mouth every 8 (eight) hours as needed. 30 tablet 0  . metFORMIN (GLUCOPHAGE) 500 MG tablet Take 1 tablet (500 mg total) by mouth daily with breakfast. For blood sugar. 90 tablet 0  . Multiple Vitamins-Minerals (ALIVE ONCE DAILY WOMENS 50+) TABS Take 1 tablet by mouth daily.    . Omega-3 1000 MG CAPS Take 1 capsule by mouth daily.    . Probiotic Product (PROBIOTIC-10) CAPS Take 1 capsule by mouth daily.    . Turmeric Curcumin 500 MG CAPS Take 1 capsule by mouth daily.    . vitamin E 400 UNIT capsule Take 400 Units by mouth daily.     No current facility-administered  medications on file prior to visit.     PAST MEDICAL HISTORY: Past Medical History:  Diagnosis Date  . Allergic rhinitis   . Allergy   . Arthritis   . Arthritis of low back   . Back pain   . Cataract   . Cataracts, bilateral   . Constipation   . Dry skin    on hands  . Fibroid uterus   . Hyperlipidemia   . Joint pain   . Osteoarthritis of right shoulder   . Ringing in ears   . Sciatica    sciatica  . Vitamin D deficiency     PAST SURGICAL HISTORY: Past Surgical History:  Procedure Laterality Date  . ABDOMINAL HYSTERECTOMY    . broken ankle    . CARPAL TUNNEL RELEASE Bilateral   . SHOULDER SURGERY Right     SOCIAL HISTORY: Social History   Tobacco Use  . Smoking status: Former Smoker    Packs/day: 1.00    Years: 29.00    Pack years: 29.00  Types: Cigarettes  . Smokeless tobacco: Never Used  . Tobacco comment: 30 years  Substance Use Topics  . Alcohol use: No    Frequency: Never  . Drug use: No    FAMILY HISTORY: Family History  Problem Relation Age of Onset  . Colon cancer Sister   . Colon polyps Sister   . Diabetes Mother        father and 9 siblings  . Kidney disease Mother        renal failure  . Hypertension Mother   . Hyperlipidemia Mother   . Stroke Mother   . Depression Mother   . Anxiety disorder Mother   . Obesity Mother   . Heart disease Father        paternal grandmother  . Obesity Father   . Diabetes Father   . Hypertension Father   . Hyperlipidemia Father   . Eating disorder Father   . Esophageal cancer Neg Hx   . Rectal cancer Neg Hx   . Stomach cancer Neg Hx    ROS: Review of Systems  Constitutional: Positive for malaise/fatigue.  Cardiovascular: Negative for chest pain.  Gastrointestinal: Negative for nausea and vomiting.  Musculoskeletal:       Negative for muscle weakness.   PHYSICAL EXAM: Blood pressure (!) 156/82, pulse 69, temperature 98 F (36.7 C), temperature source Oral, height 5\' 6"  (1.676 m), weight  202 lb (91.6 kg), SpO2 100 %. Body mass index is 32.6 kg/m. Physical Exam Vitals signs reviewed.  Constitutional:      Appearance: Normal appearance. She is obese.  Cardiovascular:     Rate and Jillian [name]: Normal rate.     Pulses: Normal pulses.  Pulmonary:     Effort: Pulmonary effort is normal.     Breath sounds: Normal breath sounds.  Musculoskeletal: Normal range of motion.  Skin:    General: Skin is warm and dry.  Neurological:     Mental Status: She is alert and oriented to person, place, and time.  Psychiatric:        Behavior: Behavior normal.   RECENT LABS AND TESTS: BMET    Component Value Date/Time   NA 139 05/12/2019 0805   K 4.3 05/12/2019 0805   CL 103 05/12/2019 0805   CO2 23 05/12/2019 0805   GLUCOSE 98 05/12/2019 0805   GLUCOSE 95 05/29/2018 0920   BUN 16 05/12/2019 0805   CREATININE 0.85 05/12/2019 0805   CALCIUM 9.2 05/12/2019 0805   GFRNONAA 73 05/12/2019 0805   GFRAA 84 05/12/2019 0805   Lab Results  Component Value Date   HGBA1C 5.5 05/12/2019   HGBA1C 5.6 10/27/2018   HGBA1C 5.6 06/12/2018   Lab Results  Component Value Date   INSULIN 10.8 05/12/2019   INSULIN 7.4 10/27/2018   INSULIN 8.5 06/12/2018   CBC    Component Value Date/Time   WBC 4.1 10/27/2018 1327   WBC 3.9 (L) 05/21/2017 0920   RBC 4.66 10/27/2018 1327   RBC 4.63 05/21/2017 0920   HGB 13.5 10/27/2018 1327   HCT 41.6 10/27/2018 1327   PLT 232.0 05/21/2017 0920   MCV 89 10/27/2018 1327   MCH 29.0 10/27/2018 1327   MCHC 32.5 10/27/2018 1327   MCHC 33.1 05/21/2017 0920   RDW 13.4 10/27/2018 1327   LYMPHSABS 1.8 10/27/2018 1327   MONOABS 0.3 05/21/2017 0920   EOSABS 0.1 10/27/2018 1327   BASOSABS 0.0 10/27/2018 1327   Iron/TIBC/Ferritin/ %Sat No results found for: IRON, TIBC, FERRITIN, IRONPCTSAT Lipid  Panel     Component Value Date/Time   CHOL 250 (H) 05/12/2019 0805   TRIG 53 05/12/2019 0805   HDL 91 05/12/2019 0805   CHOLHDL 3 05/29/2018 0920   VLDL 11.2  05/29/2018 0920   LDLCALC 150 (H) 10/27/2018 1327   LDLDIRECT 158.6 08/18/2013 0803   Hepatic Function Panel     Component Value Date/Time   PROT 7.6 05/12/2019 0805   ALBUMIN 4.5 05/12/2019 0805   AST 23 05/12/2019 0805   ALT 22 05/12/2019 0805   ALKPHOS 69 05/12/2019 0805   BILITOT 0.3 05/12/2019 0805      Component Value Date/Time   TSH 2.710 05/12/2019 0805   TSH 1.790 06/12/2018 1024   TSH 2.19 05/21/2017 0920   Results for TANNY, MITCHELTREE (MRN [id number]) as of 05/13/2019 13:42  Ref. Range 10/27/2018 13:27  Vitamin D, 25-Hydroxy Latest Ref Range: 30.0 - 100.0 ng/mL 45.8   OBESITY BEHAVIORAL INTERVENTION VISIT  Today's visit was #12  Starting weight: 212 lbs Starting date: 06/12/2018 Today's weight: 202 lbs  Today's date: 05/12/2019 Total lbs lost to date: 10    05/12/2019  Height 5\' 6"  (1.676 m)  Weight 202 lb (91.6 kg)  BMI (Calculated) 32.62  BLOOD PRESSURE - SYSTOLIC [id number]  BLOOD PRESSURE - DIASTOLIC 82   Body Fat % 42 %  Total Body Water (lbs) 75.2 lbs   ASK: We discussed the diagnosis of obesity with Jillian [name] today and Jillian [name] agreed to give [place] permission to discuss obesity behavioral modification therapy today.  ASSESS: Jillian [name] has the diagnosis of obesity and her BMI today is 32.6. Jillian [name] is in the action stage of change.   ADVISE: Jillian [name] was educated on the multiple health risks of obesity as well as the benefit of weight loss to improve her health. She was advised of the need for long term treatment and the importance of lifestyle modifications to improve her current health and to decrease her risk of future health problems.  AGREE: Multiple dietary modification options and treatment options were discussed and  Jillian [name] agreed to follow the recommendations documented in the above note.  ARRANGE: Jillian [name] was educated on the importance of frequent visits to treat obesity as outlined per CMS and USPSTF guidelines and agreed to  schedule her next follow up appointment today.  I, Michaelene Song, am acting as Location manager for Dennard Nip, MD  I have reviewed the above documentation for accuracy and completeness, and I agree with the above. -Dennard Nip, MD

## 2019-05-29 ENCOUNTER — Other Ambulatory Visit: Payer: Self-pay | Admitting: Primary Care

## 2019-06-01 ENCOUNTER — Ambulatory Visit (INDEPENDENT_AMBULATORY_CARE_PROVIDER_SITE_OTHER): Admitting: Family Medicine

## 2019-06-01 ENCOUNTER — Encounter (INDEPENDENT_AMBULATORY_CARE_PROVIDER_SITE_OTHER): Payer: Self-pay | Admitting: Family Medicine

## 2019-06-01 ENCOUNTER — Other Ambulatory Visit: Payer: Self-pay

## 2019-06-01 ENCOUNTER — Telehealth: Payer: Self-pay

## 2019-06-01 VITALS — BP 143/77 | HR 81 | Temp 97.9°F | Ht 66.0 in | Wt 200.0 lb

## 2019-06-01 DIAGNOSIS — Z9189 Other specified personal risk factors, not elsewhere classified: Secondary | ICD-10-CM

## 2019-06-01 DIAGNOSIS — E669 Obesity, unspecified: Secondary | ICD-10-CM

## 2019-06-01 DIAGNOSIS — E559 Vitamin D deficiency, unspecified: Secondary | ICD-10-CM | POA: Diagnosis not present

## 2019-06-01 DIAGNOSIS — Z6832 Body mass index (BMI) 32.0-32.9, adult: Secondary | ICD-10-CM

## 2019-06-01 DIAGNOSIS — I10 Essential (primary) hypertension: Secondary | ICD-10-CM | POA: Diagnosis not present

## 2019-06-01 DIAGNOSIS — E785 Hyperlipidemia, unspecified: Secondary | ICD-10-CM | POA: Diagnosis not present

## 2019-06-01 MED ORDER — VITAMIN D (ERGOCALCIFEROL) 1.25 MG (50000 UNIT) PO CAPS: 50000.0000 [IU] | ORAL_CAPSULE | ORAL | 0 refills | Status: DC

## 2019-06-01 MED ORDER — ATORVASTATIN CALCIUM 10 MG PO TABS: 10.0000 mg | ORAL_TABLET | Freq: Every day | ORAL | 0 refills | Status: DC

## 2019-06-01 NOTE — Telephone Encounter (Signed)
Left detailed VM w COVID screen and front door info

## 2019-06-01 NOTE — Progress Notes (Signed)
Office: [phone]  /  Fax: [phone]   HPI:   Chief Complaint: OBESITY Jillian [name] is here to discuss her progress with her obesity treatment plan. She is on the Category 2 plan and is following her eating plan approximately 50 % of the time. She states she is walking and using the elliptical 60 minutes 5 times per week. Jillian [name] continues to do well with her diet and weight loss. Her hunger is controlled, but she is struggling with evening meal prep.   Her weight is 200 lb (90.7 kg) today and has had a weight loss of 2 pounds over a period of 2 weeks since her last visit. She has lost 12 lbs since starting treatment with [place].  Vitamin D Deficiency Jillian [name] has a diagnosis of vitamin D deficiency. She is currently stable on vit D, which is improving, but is not yet at goal. Jillian [name] denies nausea, vomiting, or muscle weakness.  Hypertension (White Coat) Jillian [name] is a 63 y.o. female with hypertension. Jillian [name]'s blood pressure is elevated in the office again today, but she checks at home and her blood pressures run 120-130's/70's. She is working on weight loss to help control her blood pressure with the goal of decreasing her risk of heart attack and stroke. Jillian [name] denies chest pain, headache, or dizziness.  Hyperlipidemia (Pure) Jillian [name] has hyperlipidemia. Her HDL and triglycerides are very good, but her LDL did not improve even with diet and exercise. Jillian [name]'s ASCVD risk was calculated to be 8.9. She has been trying to improve her cholesterol levels with intensive lifestyle modification including a low saturated fat diet, exercise and weight loss.   At risk for cardiovascular disease Jillian [name] is at a higher than average risk for cardiovascular disease due to hypertension, hyperlipidemia, and obesity. She currently denies any chest pain.  ASSESSMENT AND PLAN:  Vitamin D deficiency - Plan: Vitamin D, Ergocalciferol, (DRISDOL) 1.25 MG (50000 UT) CAPS capsule  White coat  syndrome with hypertension  Hyperlipidemia, unspecified hyperlipidemia type - Plan: atorvastatin (LIPITOR) 10 MG tablet  At risk for heart disease  Class 1 obesity with serious comorbidity and body mass index (BMI) of 32.0 to 32.9 in adult, unspecified obesity type  PLAN:  Vitamin D Deficiency Jillian [name] was informed that low vitamin D levels contribute to fatigue and are associated with obesity, breast, and colon cancer. Jillian [name] agrees to continue to take prescription Vit D @50 ,000 IU every week #4 with no refills and will follow up for routine testing of vitamin D, at least 2-3 times per year. She was informed of the risk of over-replacement of vitamin D and agrees to not increase her dose unless she discusses this with [place] first. Jillian [name] agrees to follow up in 2 to 3 weeks as directed.  Hypertension (White Coat) We discussed white coat hypertension and she agreed to check her blood pressure 2 to 3 times a week at home. Jillian [name] will bring in her blood pressure log to her next visit. We discussed sodium restriction, working on healthy weight loss, and a regular exercise program as the means to achieve improved blood pressure control. We will continue to monitor her blood pressure as well as her progress with the above lifestyle modifications. She will continue her medications as prescribed and will watch for signs of hypotension as she continues her lifestyle modifications. Jillian [name] agreed with this plan and agreed to follow up as directed.  Hyperlipidemia (Pure) Jillian [name] was informed of the American Heart Association Guidelines emphasizing intensive lifestyle modifications as the  first line treatment for hyperlipidemia. We discussed many lifestyle modifications today in depth, and Jillian [name] will continue to work on decreasing saturated fats such as fatty red meat, butter and many fried foods. She will also increase vegetables and lean protein in her diet. Jillian [name] agrees to start Lipitor 10 mg qhs #30 with  no refills and to continue her diet and exercise. She agrees to follow up at the agreed upon time.  Cardiovascular risk counseling Jillian [name] was given extended (15 minutes) coronary artery disease prevention counseling today. She is 63 y.o. female and has risk factors for heart disease including hypertension, hyperlipidemia, and obesity. We discussed intensive lifestyle modifications today with an emphasis on specific weight loss instructions and strategies. Pt was also informed of the importance of increasing exercise and decreasing saturated fats to help prevent heart disease.  Obesity Jillian [name] is currently in the action stage of change. As such, her goal is to continue with weight loss efforts. She has agreed to follow the Category 2 plan and to keep a food journal of 400 to 500 calories and 40+ grams of protein for supper. Jillian [name] has been instructed to continue walking and using the elliptical machine We discussed the following Behavioral Modification Strategies today: increasing lean protein intake, decreasing simple carbohydrates, and work on meal planning and easy cooking plans.  Jillian [name] has agreed to follow up with our clinic in 2 to 3 weeks. She was informed of the importance of frequent follow up visits to maximize her success with intensive lifestyle modifications for her multiple health conditions.  ALLERGIES: No Known Allergies  MEDICATIONS: Current Outpatient Medications on File Prior to Visit  Medication Sig Dispense Refill  . Ascorbic Acid (VITAMIN C) 1000 MG tablet Take 1,000 mg by mouth daily.    Marland Kitchen aspirin EC 81 MG tablet Take 81 mg by mouth daily.    Marland Kitchen buPROPion (WELLBUTRIN SR) 200 MG 12 hr tablet Take 1 tablet (200 mg total) by mouth daily. For depression. 90 tablet 0  . CALCIUM-MAGNESIUM-ZINC PO Take 1 capsule by mouth daily.    . cetirizine (ZYRTEC) 10 MG tablet TAKE 1 TABLET BY MOUTH EVERY DAY 90 tablet 1  . Coenzyme Q10 (CO Q 10) 100 MG CAPS Take 1 capsule by mouth  daily.    . Cyanocobalamin (VITAMIN B 12 PO) Take 1 capsule by mouth daily.     . Fluocinonide Emulsified Base 0.05 % CREA APPLY  CREAM EXTERNALLY TWICE DAILY TO  AFFECTED  AREA 60 g 0  . fluticasone (FLONASE) 50 MCG/ACT nasal spray Place 1 spray into both nostrils daily as needed. 48 g 1  . GARLIC PO Take 0000000 mg by mouth daily.    Marland Kitchen ibuprofen (ADVIL,MOTRIN) 800 MG tablet Take 1 tablet (800 mg total) by mouth every 8 (eight) hours as needed. 30 tablet 0  . metFORMIN (GLUCOPHAGE) 500 MG tablet Take 1 tablet (500 mg total) by mouth daily with breakfast. For blood sugar. 90 tablet 0  . Multiple Vitamins-Minerals (ALIVE ONCE DAILY WOMENS 50+) TABS Take 1 tablet by mouth daily.    . Omega-3 1000 MG CAPS Take 1 capsule by mouth daily.    . Probiotic Product (PROBIOTIC-10) CAPS Take 1 capsule by mouth daily.    . Turmeric Curcumin 500 MG CAPS Take 1 capsule by mouth daily.    . vitamin E 400 UNIT capsule Take 400 Units by mouth daily.     No current facility-administered medications on file prior to visit.     PAST  MEDICAL HISTORY: Past Medical History:  Diagnosis Date  . Allergic rhinitis   . Allergy   . Arthritis   . Arthritis of low back   . Back pain   . Cataract   . Cataracts, bilateral   . Constipation   . Dry skin    on hands  . Fibroid uterus   . Hyperlipidemia   . Joint pain   . Osteoarthritis of right shoulder   . Ringing in ears   . Sciatica    sciatica  . Vitamin D deficiency     PAST SURGICAL HISTORY: Past Surgical History:  Procedure Laterality Date  . ABDOMINAL HYSTERECTOMY    . broken ankle    . CARPAL TUNNEL RELEASE Bilateral   . SHOULDER SURGERY Right     SOCIAL HISTORY: Social History   Tobacco Use  . Smoking status: Former Smoker    Packs/day: 1.00    Years: 29.00    Pack years: 29.00    Types: Cigarettes  . Smokeless tobacco: Never Used  . Tobacco comment: 30 years  Substance Use Topics  . Alcohol use: No    Frequency: Never  . Drug use: No     FAMILY HISTORY: Family History  Problem Relation Age of Onset  . Colon cancer Sister   . Colon polyps Sister   . Diabetes Mother        father and 9 siblings  . Kidney disease Mother        renal failure  . Hypertension Mother   . Hyperlipidemia Mother   . Stroke Mother   . Depression Mother   . Anxiety disorder Mother   . Obesity Mother   . Heart disease Father        paternal grandmother  . Obesity Father   . Diabetes Father   . Hypertension Father   . Hyperlipidemia Father   . Eating disorder Father   . Esophageal cancer Neg Hx   . Rectal cancer Neg Hx   . Stomach cancer Neg Hx     ROS: Review of Systems  Constitutional: Positive for weight loss.  Cardiovascular: Negative for chest pain.  Gastrointestinal: Negative for nausea and vomiting.  Musculoskeletal:       Negative for muscle weakness.  Neurological: Negative for dizziness and headaches.    PHYSICAL EXAM: Blood pressure (!) 143/77, pulse 81, temperature 97.9 F (36.6 C), temperature source Oral, height 5\' 6"  (1.676 m), weight 200 lb (90.7 kg), SpO2 100 %. Body mass index is 32.28 kg/m. Physical Exam Vitals signs reviewed.  Constitutional:      Appearance: Normal appearance. She is obese.  Cardiovascular:     Rate and Rhythm: Normal rate.  Pulmonary:     Effort: Pulmonary effort is normal.  Musculoskeletal: Normal range of motion.  Skin:    General: Skin is warm and dry.  Neurological:     Mental Status: She is alert and oriented to person, place, and time.  Psychiatric:        Mood and Affect: Mood normal.        Behavior: Behavior normal.     RECENT LABS AND TESTS: BMET    Component Value Date/Time   NA 139 05/12/2019 0805   K 4.3 05/12/2019 0805   CL 103 05/12/2019 0805   CO2 23 05/12/2019 0805   GLUCOSE 98 05/12/2019 0805   GLUCOSE 95 05/29/2018 0920   BUN 16 05/12/2019 0805   CREATININE 0.85 05/12/2019 0805   CALCIUM 9.2 05/12/2019 0805  GFRNONAA 73 05/12/2019 0805   GFRAA  84 05/12/2019 0805   Lab Results  Component Value Date   HGBA1C 5.5 05/12/2019   HGBA1C 5.6 10/27/2018   HGBA1C 5.6 06/12/2018   Lab Results  Component Value Date   INSULIN 10.8 05/12/2019   INSULIN 7.4 10/27/2018   INSULIN 8.5 06/12/2018   CBC    Component Value Date/Time   WBC 4.1 10/27/2018 1327   WBC 3.9 (L) 05/21/2017 0920   RBC 4.66 10/27/2018 1327   RBC 4.63 05/21/2017 0920   HGB 13.5 10/27/2018 1327   HCT 41.6 10/27/2018 1327   PLT 232.0 05/21/2017 0920   MCV 89 10/27/2018 1327   MCH 29.0 10/27/2018 1327   MCHC 32.5 10/27/2018 1327   MCHC 33.1 05/21/2017 0920   RDW 13.4 10/27/2018 1327   LYMPHSABS 1.8 10/27/2018 1327   MONOABS 0.3 05/21/2017 0920   EOSABS 0.1 10/27/2018 1327   BASOSABS 0.0 10/27/2018 1327   Iron/TIBC/Ferritin/ %Sat No results found for: IRON, TIBC, FERRITIN, IRONPCTSAT Lipid Panel     Component Value Date/Time   CHOL 250 (H) 05/12/2019 0805   TRIG 53 05/12/2019 0805   HDL 91 05/12/2019 0805   CHOLHDL 3 05/29/2018 0920   VLDL 11.2 05/29/2018 0920   LDLCALC 151 (H) 05/12/2019 0805   LDLDIRECT 158.6 08/18/2013 0803   Hepatic Function Panel     Component Value Date/Time   PROT 7.6 05/12/2019 0805   ALBUMIN 4.5 05/12/2019 0805   AST 23 05/12/2019 0805   ALT 22 05/12/2019 0805   ALKPHOS 69 05/12/2019 0805   BILITOT 0.3 05/12/2019 0805      Component Value Date/Time   TSH 2.710 05/12/2019 0805   TSH 1.790 06/12/2018 1024   TSH 2.19 05/21/2017 0920   Results for AYAH, LIBRIZZI (MRN [id number]) as of 06/01/2019 14:14  Ref. Range 10/27/2018 13:27  Vitamin D, 25-Hydroxy Latest Ref Range: 30.0 - 100.0 ng/mL 45.8    OBESITY BEHAVIORAL INTERVENTION VISIT  Today's visit was # 13   Starting weight: 212 lbs Starting date: 06/12/18 Today's weight : Weight: 200 lb (90.7 kg)  Today's date: 06/01/2019 Total lbs lost to date: 12   06/01/2019  Height 5\' 6"  (1.676 m)  Weight 200 lb (90.7 kg)  BMI (Calculated) 32.3  BLOOD  PRESSURE - SYSTOLIC [id number]  BLOOD PRESSURE - DIASTOLIC 77   Body Fat % 99991111 %  Total Body Water (lbs) 76.6 lbs   ASK: We discussed the diagnosis of obesity with Lucious Groves today and Yoland agreed to give [place] permission to discuss obesity behavioral modification therapy today.  ASSESS: Maitte has the diagnosis of obesity and her BMI today is 32.3. Abrah is in the action stage of change.   ADVISE: Jamila was educated on the multiple health risks of obesity as well as the benefit of weight loss to improve her health. She was advised of the need for long term treatment and the importance of lifestyle modifications to improve her current health and to decrease her risk of future health problems.  AGREE: Multiple dietary modification options and treatment options were discussed and Braylei agreed to follow the recommendations documented in the above note.  ARRANGE: Charlise was educated on the importance of frequent visits to treat obesity as outlined per CMS and USPSTF guidelines and agreed to schedule her next follow up appointment today.  Lenward Chancellor, CMA, am acting as transcriptionist for Starlyn Skeans, MD I have reviewed the above documentation for accuracy and  completeness, and I agree with the above. -Dennard Nip, MD

## 2019-06-05 ENCOUNTER — Other Ambulatory Visit

## 2019-06-08 ENCOUNTER — Encounter: Payer: Self-pay | Admitting: Primary Care

## 2019-06-08 ENCOUNTER — Ambulatory Visit (INDEPENDENT_AMBULATORY_CARE_PROVIDER_SITE_OTHER): Admitting: Primary Care

## 2019-06-08 ENCOUNTER — Other Ambulatory Visit: Payer: Self-pay

## 2019-06-08 VITALS — BP 140/78 | HR 82 | Temp 97.8°F | Ht 66.0 in | Wt 205.2 lb

## 2019-06-08 DIAGNOSIS — Z0000 Encounter for general adult medical examination without abnormal findings: Secondary | ICD-10-CM | POA: Diagnosis not present

## 2019-06-08 DIAGNOSIS — Z23 Encounter for immunization: Secondary | ICD-10-CM

## 2019-06-08 DIAGNOSIS — N952 Postmenopausal atrophic vaginitis: Secondary | ICD-10-CM | POA: Diagnosis not present

## 2019-06-08 DIAGNOSIS — F32A Depression, unspecified: Secondary | ICD-10-CM

## 2019-06-08 DIAGNOSIS — E559 Vitamin D deficiency, unspecified: Secondary | ICD-10-CM

## 2019-06-08 DIAGNOSIS — E785 Hyperlipidemia, unspecified: Secondary | ICD-10-CM

## 2019-06-08 DIAGNOSIS — R7303 Prediabetes: Secondary | ICD-10-CM

## 2019-06-08 DIAGNOSIS — F329 Major depressive disorder, single episode, unspecified: Secondary | ICD-10-CM

## 2019-06-08 DIAGNOSIS — J309 Allergic rhinitis, unspecified: Secondary | ICD-10-CM

## 2019-06-08 NOTE — Assessment & Plan Note (Signed)
Chronic and intermittent. Doing well on OTC regimen.

## 2019-06-08 NOTE — Patient Instructions (Addendum)
Continue exercising. You should be getting 150 minutes of moderate intensity exercise weekly.  Continue to work on a healthy diet. Ensure you are consuming 64 ounces of water daily.  Schedule a lab only appointment for one month to repeat vitamin D and cholesterol levels.  It was a pleasure to see you today!   Preventive Care 57-63 Years Old, Female Preventive care refers to visits with your health care provider and lifestyle choices that can promote health and wellness. This includes:  A yearly physical exam. This may also be called an annual well check.  Regular dental visits and eye exams.  Immunizations.  Screening for certain conditions.  Healthy lifestyle choices, such as eating a healthy diet, getting regular exercise, not using drugs or products that contain nicotine and tobacco, and limiting alcohol use. What can I expect for my preventive care visit? Physical exam Your health care provider will check your:  Height and weight. This may be used to calculate body mass index (BMI), which tells if you are at a healthy weight.  Heart rate and blood pressure.  Skin for abnormal spots. Counseling Your health care provider may ask you questions about your:  Alcohol, tobacco, and drug use.  Emotional well-being.  Home and relationship well-being.  Sexual activity.  Eating habits.  Work and work Statistician.  Method of birth control.  Menstrual cycle.  Pregnancy history. What immunizations do I need?  Influenza (flu) vaccine  This is recommended every year. Tetanus, diphtheria, and pertussis (Tdap) vaccine  You may need a Td booster every 10 years. Varicella (chickenpox) vaccine  You may need this if you have not been vaccinated. Zoster (shingles) vaccine  You may need this after age 6. Measles, mumps, and rubella (MMR) vaccine  You may need at least one dose of MMR if you were born in 1957 or later. You may also need a second dose. Pneumococcal  conjugate (PCV13) vaccine  You may need this if you have certain conditions and were not previously vaccinated. Pneumococcal polysaccharide (PPSV23) vaccine  You may need one or two doses if you smoke cigarettes or if you have certain conditions. Meningococcal conjugate (MenACWY) vaccine  You may need this if you have certain conditions. Hepatitis A vaccine  You may need this if you have certain conditions or if you travel or work in places where you may be exposed to hepatitis A. Hepatitis B vaccine  You may need this if you have certain conditions or if you travel or work in places where you may be exposed to hepatitis B. Haemophilus influenzae type b (Hib) vaccine  You may need this if you have certain conditions. Human papillomavirus (HPV) vaccine  If recommended by your health care provider, you may need three doses over 6 months. You may receive vaccines as individual doses or as more than one vaccine together in one shot (combination vaccines). Talk with your health care provider about the risks and benefits of combination vaccines. What tests do I need? Blood tests  Lipid and cholesterol levels. These may be checked every 5 years, or more frequently if you are over 71 years old.  Hepatitis C test.  Hepatitis B test. Screening  Lung cancer screening. You may have this screening every year starting at age 63 if you have a 30-pack-year history of smoking and currently smoke or have quit within the past 15 years.  Colorectal cancer screening. All adults should have this screening starting at age 63 and continuing until age 63. Your  health care provider may recommend screening at age 63 if you are at increased risk. You will have tests every 1-10 years, depending on your results and the type of screening test.  Diabetes screening. This is done by checking your blood sugar (glucose) after you have not eaten for a while (fasting). You may have this done every 1-3  years.  Mammogram. This may be done every 1-2 years. Talk with your health care provider about when you should start having regular mammograms. This may depend on whether you have a family history of breast cancer.  BRCA-related cancer screening. This may be done if you have a family history of breast, ovarian, tubal, or peritoneal cancers.  Pelvic exam and Pap test. This may be done every 3 years starting at age 63. Starting at age 73, this may be done every 5 years if you have a Pap test in combination with an HPV test. Other tests  Sexually transmitted disease (STD) testing.  Bone density scan. This is done to screen for osteoporosis. You may have this scan if you are at high risk for osteoporosis. Follow these instructions at home: Eating and drinking  Eat a diet that includes fresh fruits and vegetables, whole grains, lean protein, and low-fat dairy.  Take vitamin and mineral supplements as recommended by your health care provider.  Do not drink alcohol if: ? Your health care provider tells you not to drink. ? You are pregnant, may be pregnant, or are planning to become pregnant.  If you drink alcohol: ? Limit how much you have to 0-1 drink a day. ? Be aware of how much alcohol is in your drink. In the [place] one drink equals one 12 oz bottle of beer (355 mL), one 5 oz glass of wine (148 mL), or one 1 oz glass of hard liquor (44 mL). Lifestyle  Take daily care of your teeth and gums.  Stay active. Exercise for at least 30 minutes on 5 or more days each week.  Do not use any products that contain nicotine or tobacco, such as cigarettes, e-cigarettes, and chewing tobacco. If you need help quitting, ask your health care provider.  If you are sexually active, practice safe sex. Use a condom or other form of birth control (contraception) in order to prevent pregnancy and STIs (sexually transmitted infections).  If told by your health care provider, take low-dose aspirin daily  starting at age 28. What's next?  Visit your health care provider once a year for a well check visit.  Ask your health care provider how often you should have your eyes and teeth checked.  Stay up to date on all vaccines. This information is not intended to replace advice given to you by your health care provider. Make sure you discuss any questions you have with your health care provider. Document Released: 09/02/2015 Document Revised: 04/17/2018 Document Reviewed: 04/17/2018 Elsevier Patient Education  2020 [employer].

## 2019-06-08 NOTE — Assessment & Plan Note (Signed)
LDL above goal at 151 on recent labs. Encouraged her to start atorvastatin as prescribed, will repeat lipids and LFT's in 30 days.

## 2019-06-08 NOTE — Assessment & Plan Note (Signed)
A1C under great control on Metformin daily, continue same. Recent A1C reviewed. Continue to monitor.

## 2019-06-08 NOTE — Assessment & Plan Note (Signed)
Compliant to 50,000 units once weekly.  Last check was in March 2020. Repeat vitamin D in 4 weeks, now nearly out of the prescription strength. Will have her resume 2000 units daily.

## 2019-06-08 NOTE — Assessment & Plan Note (Signed)
No longer on estrogen patches, doing well overall.

## 2019-06-08 NOTE — Assessment & Plan Note (Signed)
Stopped taking bupropion two weeks ago as she doesn't feel as though she didn't need it. She will notify if she decides to resume. No problems.

## 2019-06-08 NOTE — Assessment & Plan Note (Signed)
Tetanus and influenza vaccinations due, provided today.  Pap smear UTD, due in 2021. Mammogram scheduled. Colonoscopy UTD, due in 2022. Encouraged regular exercise, healthy diet. Commended her on lifestyle changes. Exam today unremarkable. Labs reviewed from September 2020, will repeat lipids and vitamin D in 30 days.

## 2019-06-08 NOTE — Addendum Note (Signed)
Addended by: Jacqualin Combes on: 06/08/2019 04:51 PM   Modules accepted: Orders

## 2019-06-08 NOTE — Progress Notes (Signed)
Subjective:    Patient ID: Jillian [name], female    DOB: April 03, 1956, 63 y.o.   MRN: [id number]  HPI  Jillian [name] is a 63 year old female who presents today for complete physical.  Immunizations: -Tetanus: Completed in 2010, due today. -Influenza: Due today -Shingles: Completed Shingrix and Zostavax  Diet: She endorses a healthy diet. She is cooking most meals at home.  Exercise: She is exercising at home several days weekly.   Eye exam: Scheduled for December 2020 Dental exam: Completes semi-annually  Colonoscopy: Completed in 2019, due in 2022 Pap Smear: Completed in 2018 Mammogram: Completed in October 2019, scheduled for next weekly Hep C Screen: Negative  BP Readings from Last 3 Encounters:  06/08/19 140/78  06/01/19 (!) 143/77  05/12/19 (!) 156/82   She is checking her blood pressure at home which is running 120's/70's-80's. She recently had her BP checked at the fire department which was 125/76.  Review of Systems  Constitutional: Negative for unexpected weight change.  HENT: Negative for rhinorrhea.   Respiratory: Negative for cough and shortness of breath.   Cardiovascular: Negative for chest pain.  Gastrointestinal: Negative for constipation and diarrhea.  Genitourinary: Negative for difficulty urinating.  Musculoskeletal: Negative for arthralgias and myalgias.  Skin: Negative for rash.  Allergic/Immunologic: Negative for environmental allergies.  Neurological: Negative for dizziness, numbness and headaches.  Psychiatric/Behavioral: The patient is not nervous/anxious.        Past Medical History:  Diagnosis Date  . Allergic rhinitis   . Allergy   . Arthritis   . Arthritis of low back   . Back pain   . Cataract   . Cataracts, bilateral   . Constipation   . Dry skin    on hands  . Fibroid uterus   . Hyperlipidemia   . Joint pain   . Osteoarthritis of right shoulder   . Ringing in ears   . Sciatica    sciatica  . Vitamin D  deficiency      Social History   Socioeconomic History  . Marital status: Married    Spouse name: [name]  . Number of children: 0  . Years of education: Not on file  . Highest education level: Not on file  Occupational History  . Occupation: Retired [employer]  Social Needs  . Financial resource strain: Not on file  . Food insecurity    Worry: Not on file    Inability: Not on file  . Transportation needs    Medical: Not on file    Non-medical: Not on file  Tobacco Use  . Smoking status: Former Smoker    Packs/day: 1.00    Years: 29.00    Pack years: 29.00    Types: Cigarettes  . Smokeless tobacco: Never Used  . Tobacco comment: 30 years  Substance and Sexual Activity  . Alcohol use: No    Frequency: Never  . Drug use: No  . Sexual activity: Not on file  Lifestyle  . Physical activity    Days per week: Not on file    Minutes per session: Not on file  . Stress: Not on file  Relationships  . Social Herbalist on phone: Not on file    Gets together: Not on file    Attends religious service: Not on file    Active member of club or organization: Not on file    Attends meetings of clubs or organizations: Not on file  Relationship status: Not on file  . Intimate partner violence    Fear of current or ex partner: Not on file    Emotionally abused: Not on file    Physically abused: Not on file    Forced sexual activity: Not on file  Other Topics Concern  . Not on file  Social History Narrative   ** Merged History Encounter **        Past Surgical History:  Procedure Laterality Date  . ABDOMINAL HYSTERECTOMY    . broken ankle    . CARPAL TUNNEL RELEASE Bilateral   . SHOULDER SURGERY Right     Family History  Problem Relation Age of Onset  . Colon cancer Sister   . Colon polyps Sister   . Diabetes Mother        father and 9 siblings  . Kidney disease Mother        renal failure  . Hypertension Mother   . Hyperlipidemia Mother    . Stroke Mother   . Depression Mother   . Anxiety disorder Mother   . Obesity Mother   . Heart disease Father        paternal grandmother  . Obesity Father   . Diabetes Father   . Hypertension Father   . Hyperlipidemia Father   . Eating disorder Father   . Esophageal cancer Neg Hx   . Rectal cancer Neg Hx   . Stomach cancer Neg Hx     No Known Allergies  Current Outpatient Medications on File Prior to Visit  Medication Sig Dispense Refill  . Ascorbic Acid (VITAMIN C) 1000 MG tablet Take 1,000 mg by mouth daily.    Marland Kitchen aspirin EC 81 MG tablet Take 81 mg by mouth daily.    Marland Kitchen CALCIUM-MAGNESIUM-ZINC PO Take 1 capsule by mouth daily.    . cetirizine (ZYRTEC) 10 MG tablet TAKE 1 TABLET BY MOUTH EVERY DAY 90 tablet 1  . Coenzyme Q10 (CO Q 10) 100 MG CAPS Take 1 capsule by mouth daily.    . Fluocinonide Emulsified Base 0.05 % CREA APPLY  CREAM EXTERNALLY TWICE DAILY TO  AFFECTED  AREA 60 g 0  . fluticasone (FLONASE) 50 MCG/ACT nasal spray Place 1 spray into both nostrils daily as needed. 48 g 1  . GARLIC PO Take 0000000 mg by mouth daily.    Marland Kitchen ibuprofen (ADVIL,MOTRIN) 800 MG tablet Take 1 tablet (800 mg total) by mouth every 8 (eight) hours as needed. 30 tablet 0  . metFORMIN (GLUCOPHAGE) 500 MG tablet Take 1 tablet (500 mg total) by mouth daily with breakfast. For blood sugar. 90 tablet 0  . Multiple Vitamins-Minerals (ALIVE ONCE DAILY WOMENS 50+) TABS Take 1 tablet by mouth daily.    . Omega-3 1000 MG CAPS Take 1 capsule by mouth daily.    . Probiotic Product (PROBIOTIC-10) CAPS Take 1 capsule by mouth daily.    . Turmeric Curcumin 500 MG CAPS Take 1 capsule by mouth daily.    . Vitamin D, Ergocalciferol, (DRISDOL) 1.25 MG (50000 UT) CAPS capsule Take 1 capsule (50,000 Units total) by mouth every 7 (seven) days. 4 capsule 0  . vitamin E 400 UNIT capsule Take 400 Units by mouth daily.    Marland Kitchen atorvastatin (LIPITOR) 10 MG tablet Take 1 tablet (10 mg total) by mouth daily. (Patient not taking:  Reported on 06/08/2019) 30 tablet 0   No current facility-administered medications on file prior to visit.     BP 140/78  Pulse 82   Temp 97.8 F (36.6 C) (Temporal)   Ht 5\' 6"  (1.676 m)   Wt 205 lb 4 oz (93.1 kg)   SpO2 98%   BMI 33.13 kg/m    Objective:   Physical Exam  Constitutional: She is oriented to person, place, and time. She appears well-nourished.  HENT:  Right Ear: Tympanic membrane and ear canal normal.  Left Ear: Tympanic membrane and ear canal normal.  Mouth/Throat: Oropharynx is clear and moist.  Eyes: Pupils are equal, round, and reactive to light. EOM are normal.  Neck: Neck supple.  Cardiovascular: Normal rate and regular rhythm.  Respiratory: Effort normal and breath sounds normal.  GI: Soft. Bowel sounds are normal. There is no abdominal tenderness.  Musculoskeletal: Normal range of motion.  Neurological: She is alert and oriented to person, place, and time.  Skin: Skin is warm and dry.  Psychiatric: She has a normal mood and affect.           Assessment & Plan:

## 2019-06-12 LAB — HM MAMMOGRAPHY

## 2019-06-17 ENCOUNTER — Encounter: Payer: Self-pay | Admitting: Primary Care

## 2019-06-22 ENCOUNTER — Ambulatory Visit (INDEPENDENT_AMBULATORY_CARE_PROVIDER_SITE_OTHER): Admitting: Family Medicine

## 2019-06-22 DIAGNOSIS — F331 Major depressive disorder, recurrent, moderate: Secondary | ICD-10-CM

## 2019-06-23 MED ORDER — BUPROPION HCL ER (SR) 200 MG PO TB12: 200.0000 mg | ORAL_TABLET | Freq: Every day | ORAL | 1 refills | Status: DC

## 2019-06-25 ENCOUNTER — Other Ambulatory Visit: Payer: Self-pay | Admitting: Primary Care

## 2019-06-25 DIAGNOSIS — J309 Allergic rhinitis, unspecified: Secondary | ICD-10-CM

## 2019-07-01 ENCOUNTER — Other Ambulatory Visit: Payer: Self-pay | Admitting: Primary Care

## 2019-07-01 DIAGNOSIS — E785 Hyperlipidemia, unspecified: Secondary | ICD-10-CM

## 2019-07-01 DIAGNOSIS — E559 Vitamin D deficiency, unspecified: Secondary | ICD-10-CM

## 2019-07-03 ENCOUNTER — Other Ambulatory Visit (INDEPENDENT_AMBULATORY_CARE_PROVIDER_SITE_OTHER): Payer: Self-pay | Admitting: Family Medicine

## 2019-07-03 DIAGNOSIS — E785 Hyperlipidemia, unspecified: Secondary | ICD-10-CM

## 2019-07-04 ENCOUNTER — Other Ambulatory Visit (INDEPENDENT_AMBULATORY_CARE_PROVIDER_SITE_OTHER): Payer: Self-pay | Admitting: Family Medicine

## 2019-07-04 DIAGNOSIS — E785 Hyperlipidemia, unspecified: Secondary | ICD-10-CM

## 2019-07-06 ENCOUNTER — Other Ambulatory Visit: Payer: Self-pay | Admitting: *Deleted

## 2019-07-06 DIAGNOSIS — E785 Hyperlipidemia, unspecified: Secondary | ICD-10-CM

## 2019-07-06 MED ORDER — ATORVASTATIN CALCIUM 10 MG PO TABS: 10.0000 mg | ORAL_TABLET | Freq: Every day | ORAL | 0 refills | Status: DC

## 2019-07-08 ENCOUNTER — Other Ambulatory Visit: Payer: Self-pay

## 2019-07-08 ENCOUNTER — Other Ambulatory Visit (INDEPENDENT_AMBULATORY_CARE_PROVIDER_SITE_OTHER)

## 2019-07-08 ENCOUNTER — Encounter: Payer: Self-pay | Admitting: Family Medicine

## 2019-07-08 ENCOUNTER — Ambulatory Visit (INDEPENDENT_AMBULATORY_CARE_PROVIDER_SITE_OTHER): Admitting: Family Medicine

## 2019-07-08 VITALS — BP 134/78 | HR 76 | Temp 97.6°F | Ht 66.0 in | Wt 210.8 lb

## 2019-07-08 DIAGNOSIS — E785 Hyperlipidemia, unspecified: Secondary | ICD-10-CM

## 2019-07-08 DIAGNOSIS — S0501XA Injury of conjunctiva and corneal abrasion without foreign body, right eye, initial encounter: Secondary | ICD-10-CM

## 2019-07-08 DIAGNOSIS — E559 Vitamin D deficiency, unspecified: Secondary | ICD-10-CM

## 2019-07-08 LAB — LIPID PANEL
Cholesterol: 210 mg/dL — ABNORMAL HIGH (ref 0–200)
HDL: 83 mg/dL (ref >39.00)
LDL Cholesterol: 119 mg/dL — ABNORMAL HIGH (ref 0–99)
NonHDL: 126.54
Total CHOL/HDL Ratio: 3
Triglycerides: 37 mg/dL (ref 0.0–149.0)
VLDL: 7.4 mg/dL (ref 0.0–40.0)

## 2019-07-08 LAB — HEPATIC FUNCTION PANEL
ALT: 19 U/L (ref 0–35)
AST: 23 U/L (ref 0–37)
Albumin: 4.3 g/dL (ref 3.5–5.2)
Alkaline Phosphatase: 62 U/L (ref 39–117)
Bilirubin, Direct: 0.1 mg/dL (ref 0.0–0.3)
Total Bilirubin: 0.5 mg/dL (ref 0.2–1.2)
Total Protein: 7.3 g/dL (ref 6.0–8.3)

## 2019-07-08 LAB — VITAMIN D 25 HYDROXY (VIT D DEFICIENCY, FRACTURES): VITD: 53.47 ng/mL (ref 30.00–100.00)

## 2019-07-08 MED ORDER — ERYTHROMYCIN 5 MG/GM OP OINT: 1.0000 "application " | TOPICAL_OINTMENT | Freq: Four times a day (QID) | OPHTHALMIC | 0 refills | Status: AC

## 2019-07-08 NOTE — Progress Notes (Signed)
   Subjective:    Patient ID: Jillian [name], female    DOB: April 10, 1956, 63 y.o.   MRN: [id number]  HPI This is 63 yo [place] American woman who comes to the office for acute visit. Pt reports that she was cleaning her pepper plants and something got in the right eye. Patient flushed her eye with saline but it did not help. Pt woke up next day with some swelling and feeling that something is in her eye.    Review of Systems  Eyes: Positive for redness and itching. Negative for visual disturbance.  Respiratory: Negative for shortness of breath and wheezing.   Cardiovascular: Negative for chest pain.       Objective:   Physical Exam Constitutional:      Appearance: She is normal weight.  HENT:     Head: Normocephalic and atraumatic.  Cardiovascular:     Rate and Rhythm: Normal rate and regular rhythm.     Pulses: Normal pulses.     Heart sounds: Normal heart sounds.  Pulmonary:     Effort: Pulmonary effort is normal.     Breath sounds: Normal breath sounds.  Neurological:     Mental Status: She is alert.         Assessment & Plan:  1. Abrasion of right cornea, initial encounter Pt was evaluated for corneal abrasion and was found to have one to 9 o'clock. - erythromycin ophthalmic ointment; Place 1 application into the right eye 4 (four) times daily for 5 days.  Dispense: 3.5 g; Refill: 0

## 2019-07-08 NOTE — Patient Instructions (Addendum)
Good to see you today   I have sent in antibiotic ointment for your right eye, use 4 times a day for 5 days  If worsening symptoms, call the office, if at night or weekend, go to ER or urgent care  Can use lubricating eye drops, saline eye drops as needed for comfort   Corneal Abrasion  A corneal abrasion is a scratch or injury to the clear covering over the front of your eye (cornea). This can be painful. It is important to get treatment for a corneal abrasion. If this problem is not treated, it can affect your eyesight (vision). Follow these instructions at home: Medicines  Use eye drops or ointments as told by your doctor.  If you were prescribed antibiotic drops or ointment, use them as told by your doctor. Do not stop using the antibiotic even if you start to feel better.  Take over-the-counter and prescription medicines only as told by your doctor.  Do not drive or use heavy machinery while taking prescription pain medicine. General instructions  If you have an eye patch, wear it as told by your doctor. ? Do not drive or use machinery while wearing an eye patch. ? Follow instructions from your doctor about when to take off the patch.  Ask your doctor if you can use a cold, wet cloth (compress) on your eye to help with pain.  Do not rub or touch your eye. Do not wash out your eye.  Do not wear contact lenses until your doctor says that this is okay.  Avoid bright light.  Avoid straining your eyes.  Keep all follow-up visits as told by your doctor. Doing this can help to prevent infection and loss of eyesight. Contact a doctor if:  You continue to have eye pain and other symptoms for more than 2 days.  You get new symptoms, such as: ? Redness. ? Watery eyes (tearing). ? Discharge.  You have discharge that makes your eyelids stick together in the morning.  Symptoms come back after your eye heals. Get help right away if:  You have very bad eye pain that does not  get better with medicine.  You lose eyesight. Summary  A corneal abrasion is a scratch or injury to the clear covering over the front of your eye (cornea).  It is important to get treatment for a corneal abrasion. If this problem is not treated, it can affect your eyesight (vision).  Use eye drops or ointments as told by your doctor.  If you have an eye patch, do not drive or use machinery while wearing it. This information is not intended to replace advice given to you by your health care provider. Make sure you discuss any questions you have with your health care provider. Document Released: 01/23/2008 Document Revised: 07/21/2016 Document Reviewed: 07/21/2016 Elsevier Interactive Patient Education  [employer].

## 2019-07-08 NOTE — Progress Notes (Signed)
Subjective:    Patient ID: Jillian [name], female    DOB: [birth year], 63 y.o.   MRN: [id number]  HPI Chief Complaint  Patient presents with  . swollen right eye    feels like something is iher eye, started Monday     This is a 63 yo female who presents today with above cc. Was doing yard work and it was windy, felt like she got something in her right eye. Occurred 3 days ago. Yesterday, eye was swollen and she applied warm compresses. No visual changes or headaches. Feels like grit in her eye. No drainage, feels dry. She has uses some saline eye drops intermittently with short term improvement of dry eye.   Past Medical History:  Diagnosis Date  . Allergic rhinitis   . Allergy   . Arthritis   . Arthritis of low back   . Back pain   . Cataract   . Cataracts, bilateral   . Constipation   . Dry skin    on hands  . Fibroid uterus   . Hyperlipidemia   . Joint pain   . Osteoarthritis of right shoulder   . Ringing in ears   . Sciatica    sciatica  . Vitamin D deficiency    Past Surgical History:  Procedure Laterality Date  . ABDOMINAL HYSTERECTOMY    . broken ankle    . CARPAL TUNNEL RELEASE Bilateral   . SHOULDER SURGERY Right    Family History  Problem Relation Age of Onset  . Colon cancer Sister   . Colon polyps Sister   . Diabetes Mother        father and 9 siblings  . Kidney disease Mother        renal failure  . Hypertension Mother   . Hyperlipidemia Mother   . Stroke Mother   . Depression Mother   . Anxiety disorder Mother   . Obesity Mother   . Heart disease Father        paternal grandmother  . Obesity Father   . Diabetes Father   . Hypertension Father   . Hyperlipidemia Father   . Eating disorder Father   . Esophageal cancer Neg Hx   . Rectal cancer Neg Hx   . Stomach cancer Neg Hx    Social History   Tobacco Use  . Smoking status: Former Smoker    Packs/day: 1.00    Years: 29.00    Pack years: 29.00    Types: Cigarettes  .  Smokeless tobacco: Never Used  . Tobacco comment: 30 years  Substance Use Topics  . Alcohol use: No    Frequency: Never  . Drug use: No      Review of Systems Per HPI    Objective:   Physical Exam Vitals signs reviewed.  Constitutional:      General: She is not in acute distress.    Appearance: Normal appearance. She is obese. She is not ill-appearing, toxic-appearing or diaphoretic.  HENT:     Head: Normocephalic and atraumatic.  Eyes:     Extraocular Movements: Extraocular movements intact.     Conjunctiva/sclera:     Right eye: Right conjunctiva is injected.      Comments: Right eye examined with fluorescein and rinsed with sterile saline.   Cardiovascular:     Rate and Rhythm: Normal rate.  Pulmonary:     Effort: Pulmonary effort is normal.  Skin:    General: Skin is warm and dry.  Neurological:  Mental Status: She is alert and oriented to person, place, and time.  Psychiatric:        Mood and Affect: Mood normal.        Behavior: Behavior normal.        Thought Content: Thought content normal.        Judgment: Judgment normal.       BP 134/78   Pulse 76   Temp 97.6 F (36.4 C) (Skin)   Ht 5\' 6"  (1.676 m)   Wt 210 lb 12 oz (95.6 kg)   BMI 34.02 kg/m    Right eye Left eye Both eyes  Without correction:     With correction: 20/20 20/20 20/20        Assessment & Plan:  1. Abrasion of right cornea, initial encounter - Provided written and verbal information regarding diagnosis and treatment. - RTC/ER/UC precautions reviewed - erythromycin ophthalmic ointment; Place 1 application into the right eye 4 (four) times daily for 5 days.  Dispense: 3.5 g; Refill: 0   Clarene Reamer, FNP-BC  [facility]  07/09/2019 8:37 AM

## 2019-07-09 ENCOUNTER — Other Ambulatory Visit

## 2019-07-09 ENCOUNTER — Encounter: Payer: Self-pay | Admitting: Family Medicine

## 2019-07-09 MED ORDER — ATORVASTATIN CALCIUM 20 MG PO TABS: 20.0000 mg | ORAL_TABLET | Freq: Every evening | ORAL | 3 refills | Status: DC

## 2019-07-09 NOTE — Telephone Encounter (Signed)
Jillian [name], can you look into her labs? Are they released to my chart?

## 2019-07-10 NOTE — Telephone Encounter (Signed)
Yes, you did release the labs on 07/08/2019. It shows that patient viewed them, a couple on 07/08/2019 and one on 07/09/2019

## 2019-07-24 ENCOUNTER — Telehealth: Payer: Self-pay | Admitting: Primary Care

## 2019-07-24 DIAGNOSIS — E785 Hyperlipidemia, unspecified: Secondary | ICD-10-CM

## 2019-07-24 NOTE — Telephone Encounter (Signed)
Pt schedule follow up on meds appointment 12/23 and wanted to do labs prior.   no order in system  Ok to schedule?

## 2019-07-24 NOTE — Telephone Encounter (Signed)
Appointment 12/21  Pt aware

## 2019-07-24 NOTE — Telephone Encounter (Signed)
Okay to schedule the lab appointment. Lab order placed.

## 2019-08-06 ENCOUNTER — Telehealth: Payer: Self-pay

## 2019-08-06 NOTE — Telephone Encounter (Signed)
LVM w COVID screen, front door and back lab info 12.17.2020 TLJ

## 2019-08-10 ENCOUNTER — Other Ambulatory Visit (INDEPENDENT_AMBULATORY_CARE_PROVIDER_SITE_OTHER)

## 2019-08-10 DIAGNOSIS — E559 Vitamin D deficiency, unspecified: Secondary | ICD-10-CM | POA: Diagnosis not present

## 2019-08-10 DIAGNOSIS — E785 Hyperlipidemia, unspecified: Secondary | ICD-10-CM

## 2019-08-10 LAB — LIPID PANEL
Cholesterol: 204 mg/dL — ABNORMAL HIGH (ref 0–200)
HDL: 86.2 mg/dL (ref >39.00)
LDL Cholesterol: 108 mg/dL — ABNORMAL HIGH (ref 0–99)
NonHDL: 117.37
Total CHOL/HDL Ratio: 2
Triglycerides: 46 mg/dL (ref 0.0–149.0)
VLDL: 9.2 mg/dL (ref 0.0–40.0)

## 2019-08-10 LAB — HEPATIC FUNCTION PANEL
ALT: 30 U/L (ref 0–35)
AST: 28 U/L (ref 0–37)
Albumin: 4.2 g/dL (ref 3.5–5.2)
Alkaline Phosphatase: 65 U/L (ref 39–117)
Bilirubin, Direct: 0 mg/dL (ref 0.0–0.3)
Total Bilirubin: 0.3 mg/dL (ref 0.2–1.2)
Total Protein: 7.7 g/dL (ref 6.0–8.3)

## 2019-08-10 LAB — VITAMIN D 25 HYDROXY (VIT D DEFICIENCY, FRACTURES): VITD: 49.35 ng/mL (ref 30.00–100.00)

## 2019-08-12 ENCOUNTER — Ambulatory Visit (INDEPENDENT_AMBULATORY_CARE_PROVIDER_SITE_OTHER): Admitting: Primary Care

## 2019-08-12 ENCOUNTER — Encounter: Payer: Self-pay | Admitting: Primary Care

## 2019-08-12 ENCOUNTER — Other Ambulatory Visit: Payer: Self-pay

## 2019-08-12 DIAGNOSIS — E785 Hyperlipidemia, unspecified: Secondary | ICD-10-CM | POA: Diagnosis not present

## 2019-08-12 DIAGNOSIS — F331 Major depressive disorder, recurrent, moderate: Secondary | ICD-10-CM

## 2019-08-12 DIAGNOSIS — E559 Vitamin D deficiency, unspecified: Secondary | ICD-10-CM | POA: Diagnosis not present

## 2019-08-12 DIAGNOSIS — R7303 Prediabetes: Secondary | ICD-10-CM | POA: Diagnosis not present

## 2019-08-12 MED ORDER — METFORMIN HCL 500 MG PO TABS: 500.0000 mg | ORAL_TABLET | Freq: Every day | ORAL | 3 refills | Status: DC

## 2019-08-12 NOTE — Assessment & Plan Note (Signed)
Compliant to 3000 units daily, recent vitamin D level stable. Continue same.

## 2019-08-12 NOTE — Assessment & Plan Note (Signed)
Slight improvement in LDL from increased dose of atorvastatin. Encouraged weight loss through diet and exercise.

## 2019-08-12 NOTE — Assessment & Plan Note (Signed)
Taking bupropion for cravings/emotional eating. Will defer further treatment to weight loss center.

## 2019-08-12 NOTE — Progress Notes (Signed)
Subjective:    Patient ID: Jillian [name], female    DOB: 06/12/1956, 63 y.o.   MRN: [id number]  HPI  Ms. Jillian [name] is a 63 year old female who presents today for medication refills and to discuss recent lab results.  She is compliant to her atorvastatin 20 mg that was increased in November 2020. LDL decreased to 108 from 119. She would like to work on weight loss through diet and exercise.   She would like a refill of Metformin for which she takes once daily for diabetes prevention and weight loss.   She has resumed her bupropion 200 mg SR for food cravings and obesity. She is following with the [facility] weight [facility] clinic.   BP Readings from Last 3 Encounters:  08/12/19 130/74  07/08/19 134/78  06/08/19 140/78   Wt Readings from Last 3 Encounters:  08/12/19 216 lb 12 oz (98.3 kg)  07/08/19 210 lb 12 oz (95.6 kg)  06/08/19 205 lb 4 oz (93.1 kg)   The 10-year ASCVD risk score Jillian Bussing DC Jr., et al., 2013) is: 6%   Values used to calculate the score:     Age: 42 years     Sex: Female     Is Non-Hispanic African American: Yes     Diabetic: No     Tobacco smoker: No     Systolic Blood Pressure: [id number] mmHg     Is BP treated: No     HDL Cholesterol: 86.2 mg/dL     Total Cholesterol: 204 mg/dL   Review of Systems  Respiratory: Negative for shortness of breath.   Cardiovascular: Negative for chest pain.  Musculoskeletal: Negative for arthralgias and myalgias.  Psychiatric/Behavioral: The patient is not nervous/anxious.        Past Medical History:  Diagnosis Date  . Allergic rhinitis   . Allergy   . Arthritis   . Arthritis of low back   . Back pain   . Cataract   . Cataracts, bilateral   . Constipation   . Dry skin    on hands  . Fibroid uterus   . Hyperlipidemia   . Joint pain   . Osteoarthritis of right shoulder   . Ringing in ears   . Sciatica    sciatica  . Vitamin D deficiency      Social History   Socioeconomic History  . Marital  status: Married    Spouse name: [name]  . Number of children: 0  . Years of education: Not on file  . Highest education level: Not on file  Occupational History  . Occupation: Retired [employer]  Tobacco Use  . Smoking status: Former Smoker    Packs/day: 1.00    Years: 29.00    Pack years: 29.00    Types: Cigarettes  . Smokeless tobacco: Never Used  . Tobacco comment: 30 years  Substance and Sexual Activity  . Alcohol use: No  . Drug use: No  . Sexual activity: Not on file  Other Topics Concern  . Not on file  Social History Narrative   ** Merged History Encounter **       Social Determinants of Health   Financial Resource Strain:   . Difficulty of Paying Living Expenses: Not on file  Food Insecurity:   . Worried About Charity fundraiser in the Last Year: Not on file  . Ran Out of Food in the Last Year: Not on file  Transportation Needs:   .  Lack of Transportation (Medical): Not on file  . Lack of Transportation (Non-Medical): Not on file  Physical Activity:   . Days of Exercise per Week: Not on file  . Minutes of Exercise per Session: Not on file  Stress:   . Feeling of Stress : Not on file  Social Connections:   . Frequency of Communication with Friends and Family: Not on file  . Frequency of Social Gatherings with Friends and Family: Not on file  . Attends Religious Services: Not on file  . Active Member of Clubs or Organizations: Not on file  . Attends Archivist Meetings: Not on file  . Marital Status: Not on file  Intimate Partner Violence:   . Fear of Current or Ex-Partner: Not on file  . Emotionally Abused: Not on file  . Physically Abused: Not on file  . Sexually Abused: Not on file    Past Surgical History:  Procedure Laterality Date  . ABDOMINAL HYSTERECTOMY    . broken ankle    . CARPAL TUNNEL RELEASE Bilateral   . SHOULDER SURGERY Right     Family History  Problem Relation Age of Onset  . Colon cancer Sister   .  Colon polyps Sister   . Diabetes Mother        father and 9 siblings  . Kidney disease Mother        renal failure  . Hypertension Mother   . Hyperlipidemia Mother   . Stroke Mother   . Depression Mother   . Anxiety disorder Mother   . Obesity Mother   . Heart disease Father        paternal grandmother  . Obesity Father   . Diabetes Father   . Hypertension Father   . Hyperlipidemia Father   . Eating disorder Father   . Esophageal cancer Neg Hx   . Rectal cancer Neg Hx   . Stomach cancer Neg Hx     No Known Allergies  Current Outpatient Medications on File Prior to Visit  Medication Sig Dispense Refill  . Ascorbic Acid (VITAMIN C) 1000 MG tablet Take 1,000 mg by mouth daily.    Marland Kitchen aspirin EC 81 MG tablet Take 81 mg by mouth daily.    Marland Kitchen atorvastatin (LIPITOR) 20 MG tablet Take 1 tablet (20 mg total) by mouth every evening. For cholesterol. 90 tablet 3  . buPROPion (WELLBUTRIN SR) 200 MG 12 hr tablet Take 1 tablet (200 mg total) by mouth daily. For depression. 90 tablet 1  . CALCIUM-MAGNESIUM-ZINC PO Take 1 capsule by mouth daily.    . cetirizine (ZYRTEC) 10 MG tablet TAKE 1 TABLET BY MOUTH EVERY DAY 90 tablet 1  . Coenzyme Q10 (CO Q 10) 100 MG CAPS Take 1 capsule by mouth daily.    . Fluocinonide Emulsified Base 0.05 % CREA APPLY  CREAM EXTERNALLY TWICE DAILY TO  AFFECTED  AREA 60 g 0  . fluticasone (FLONASE) 50 MCG/ACT nasal spray Place 1 spray into both nostrils daily as needed. 48 g 1  . GARLIC PO Take 0000000 mg by mouth daily.    Marland Kitchen ibuprofen (ADVIL,MOTRIN) 800 MG tablet Take 1 tablet (800 mg total) by mouth every 8 (eight) hours as needed. 30 tablet 0  . Multiple Vitamins-Minerals (ALIVE ONCE DAILY WOMENS 50+) TABS Take 1 tablet by mouth daily.    . Omega-3 1000 MG CAPS Take 1 capsule by mouth daily.    . Probiotic Product (PROBIOTIC-10) CAPS Take 1 capsule by mouth daily.    Marland Kitchen  Turmeric Curcumin 500 MG CAPS Take 1 capsule by mouth daily.    . Vitamin D, Ergocalciferol,  (DRISDOL) 1.25 MG (50000 UT) CAPS capsule Take 1 capsule (50,000 Units total) by mouth every 7 (seven) days. 4 capsule 0  . vitamin E 400 UNIT capsule Take 400 Units by mouth daily.     No current facility-administered medications on file prior to visit.    BP 130/74   Pulse 80   Temp (!) 96.5 F (35.8 C) (Temporal)   Ht 5\' 6"  (1.676 m)   Wt 216 lb 12 oz (98.3 kg)   SpO2 98%   BMI 34.98 kg/m    Objective:   Physical Exam  Constitutional: She appears well-nourished.  Cardiovascular: Normal rate and regular rhythm.  Respiratory: Effort normal and breath sounds normal.  Musculoskeletal:     Cervical back: Neck supple.  Skin: Skin is warm and dry.  Psychiatric: She has a normal mood and affect.           Assessment & Plan:

## 2019-08-12 NOTE — Patient Instructions (Signed)
Continue vitamin D 3000 units daily.  Continue Metformin once daily.  Continue bupropion SR 200 mg once daily.  It was a pleasure to see you today!

## 2019-09-10 ENCOUNTER — Telehealth: Payer: Self-pay | Admitting: Primary Care

## 2019-09-10 NOTE — Telephone Encounter (Signed)
Pt called regarding her bill for [facility] 06/08/2019.  She called the billing department @ [facility].  They told her it was coded incorrect and needs to be re coded and re filed pt had her cpx that day

## 2019-09-21 ENCOUNTER — Ambulatory Visit (INDEPENDENT_AMBULATORY_CARE_PROVIDER_SITE_OTHER)

## 2019-09-21 ENCOUNTER — Ambulatory Visit (INDEPENDENT_AMBULATORY_CARE_PROVIDER_SITE_OTHER): Admitting: Orthopedic Surgery

## 2019-09-21 ENCOUNTER — Encounter: Payer: Self-pay | Admitting: Orthopedic Surgery

## 2019-09-21 ENCOUNTER — Other Ambulatory Visit: Payer: Self-pay

## 2019-09-21 DIAGNOSIS — M545 Low back pain, unspecified: Secondary | ICD-10-CM

## 2019-09-21 MED ORDER — METHOCARBAMOL 500 MG PO TABS: ORAL_TABLET | ORAL | 0 refills | Status: DC

## 2019-09-21 MED ORDER — MELOXICAM 15 MG PO TABS: ORAL_TABLET | ORAL | 0 refills | Status: DC

## 2019-09-21 NOTE — Telephone Encounter (Signed)
I spoke with the patient regarding the information received from the billing department. Patient stated she was told different information from her insurance company. Email sent back to billing with new information requesting review.

## 2019-09-22 NOTE — Telephone Encounter (Signed)
I spoke with the patient informed her that an appeal is being filed regarding dos in question.

## 2019-09-27 ENCOUNTER — Encounter: Payer: Self-pay | Admitting: Orthopedic Surgery

## 2019-09-27 NOTE — Progress Notes (Signed)
Office Visit Note   Patient: Jillian [name]           Date of Birth: [birth year]           MRN: [id number] Visit Date: 09/21/2019 Requested by: Pleas Koch, NP [place],  [place] [id number] PCP: Pleas Koch, NP  Subjective: Chief Complaint  Patient presents with  . Right Leg - Numbness  . Left Leg - Numbness  . Lower Back - Pain    HPI: Jillian [name] is a patient with low back pain and numbness affecting her legs in an alternating fashion.  Been taking Aleve as needed.  She has had previous epidural steroid injections but the pain is come back.  Denies any fevers or chills.  She had previous imaging done years ago.  In 2016 lumbar spine MRI showed progressive disc degeneration at L4-5.  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  She does report some tingling from the groin to the foot and heel on the left-hand side.  Static episodes are less.  She states that her back feels tired.  Last ESI was about 5 years ago.  She is doing home exercise program and walking and stretching.              ROS: All systems reviewed are negative as they relate to the chief complaint within the history of present illness.  Patient denies  fevers or chills.   Assessment & Plan: Visit Diagnoses:  1. Low back pain, unspecified back pain laterality, unspecified chronicity, unspecified whether sciatica present     Plan: Impression is low back pain with left-sided radiculopathy.  Plan is Mobic and Robaxin.  Come back in 4 weeks and will decide for or against repeat MRI scan there with likely ESI's to follow.  Follow-Up Instructions: Return if symptoms worsen or fail to improve.   Orders:  Orders Placed This Encounter  Procedures  . XR Lumbar Spine 2-3 Views   Meds ordered this encounter  Medications  . meloxicam (MOBIC) 15 MG tablet    Sig: 1 po q d x 3 weeks then q d prn    Dispense:  60 tablet    Refill:  0  . methocarbamol (ROBAXIN) 500 MG tablet    Sig: 1  po q 8 hrs prn    Dispense:  30 tablet    Refill:  0      Procedures: No procedures performed   Clinical Data: No additional findings.  Objective: Vital Signs: There were no vitals taken for this visit.  Physical Exam:   Constitutional: Patient appears well-developed HEENT:  Head: Normocephalic Eyes:EOM are normal Neck: Normal range of motion Cardiovascular: Normal rate Pulmonary/chest: Effort normal Neurologic: Patient is alert Skin: Skin is warm Psychiatric: Patient has normal mood and affect    Ortho Exam: Ortho exam demonstrates normal gait alignment.  Patient has good ankle dorsiflexion plantarflexion quad hamstring strength with no nerve root tension signs today.  No definite paresthesias L1 S1 bilaterally.  No masses lymphadenopathy or skin changes noted in that leg region or back region.  No trochanteric tenderness is noted.  Specialty Comments:  No specialty comments available.  Imaging: No results found.   PMFS History: Patient Active Problem List   Diagnosis Date Noted  . Prediabetes 02/05/2019  . Depression 02/05/2019  . Dry skin 06/02/2018  . Pelvic pain 02/19/2018  . Chronic back pain 08/09/2017  . Primary osteoarthritis of right shoulder  09/20/2016  . Alopecia 01/10/2016  . Obesity 09/20/2015  . Preventative health care 09/08/2015  . Former smoker 09/08/2014  . HLD (hyperlipidemia) 09/01/2013  . Vitamin D deficiency 07/03/2010  . Vaginal atrophy 06/30/2009  . Allergic rhinitis 03/01/2009   Past Medical History:  Diagnosis Date  . Allergic rhinitis   . Allergy   . Arthritis   . Arthritis of low back   . Back pain   . Cataract   . Cataracts, bilateral   . Constipation   . Dry skin    on hands  . Fibroid uterus   . Hyperlipidemia   . Joint pain   . Osteoarthritis of right shoulder   . Ringing in ears   . Sciatica    sciatica  . Vitamin D deficiency     Family History  Problem Relation Age of Onset  . Colon cancer Sister   .  Colon polyps Sister   . Diabetes Mother        father and 9 siblings  . Kidney disease Mother        renal failure  . Hypertension Mother   . Hyperlipidemia Mother   . Stroke Mother   . Depression Mother   . Anxiety disorder Mother   . Obesity Mother   . Heart disease Father        paternal grandmother  . Obesity Father   . Diabetes Father   . Hypertension Father   . Hyperlipidemia Father   . Eating disorder Father   . Esophageal cancer Neg Hx   . Rectal cancer Neg Hx   . Stomach cancer Neg Hx     Past Surgical History:  Procedure Laterality Date  . ABDOMINAL HYSTERECTOMY    . broken ankle    . CARPAL TUNNEL RELEASE Bilateral   . SHOULDER SURGERY Right    Social History   Occupational History  . Occupation: Retired [employer]  Tobacco Use  . Smoking status: Former Smoker    Packs/day: 1.00    Years: 29.00    Pack years: 29.00    Types: Cigarettes  . Smokeless tobacco: Never Used  . Tobacco comment: 30 years  Substance and Sexual Activity  . Alcohol use: No  . Drug use: No  . Sexual activity: Not on file

## 2019-10-19 ENCOUNTER — Encounter: Payer: Self-pay | Admitting: Orthopedic Surgery

## 2019-10-19 ENCOUNTER — Other Ambulatory Visit: Payer: Self-pay

## 2019-10-19 ENCOUNTER — Ambulatory Visit (INDEPENDENT_AMBULATORY_CARE_PROVIDER_SITE_OTHER): Admitting: Orthopedic Surgery

## 2019-10-19 DIAGNOSIS — M545 Low back pain, unspecified: Secondary | ICD-10-CM

## 2019-10-19 DIAGNOSIS — M5416 Radiculopathy, lumbar region: Secondary | ICD-10-CM

## 2019-10-24 ENCOUNTER — Encounter: Payer: Self-pay | Admitting: Orthopedic Surgery

## 2019-10-24 NOTE — Progress Notes (Signed)
Office Visit Note   Patient: Jillian [name]           Date of Birth: Apr 06, 1956           MRN: [id number] Visit Date: 10/19/2019 Requested by: Pleas Koch, NP [place],  [place] [address] PCP: Pleas Koch, NP  Subjective: Chief Complaint  Patient presents with  . Follow-up    HPI: Jillian [name] is a 64 y.o. female who presents to the office complaining of low back pain.  She returns for follow-up 4 weeks after her last office visit.  She notes that she is doing about the same with no significant relief of pain with the course of meloxicam.  She has been dealing with her low back pain for about 3 months.  She localizes pain to the lumbar spine as well as complaining of a sensation that starts in her groin and goes down her right leg.  This is associated with numbness and tingling of the right leg.  Her main complaint is mostly this leg pain rather than the low back pain.  She describes it as a dull ache in her low back.  Flexion makes her pain slightly worse.  Denies any significant weakness.  Keeping her back straight helps with her pain.  She has a history of 3 prior epidural steroid injections that provided good relief.  She denies any history of back surgery..                ROS:  All systems reviewed are negative as they relate to the chief complaint within the history of present illness.  Patient denies fevers or chills.  Assessment & Plan: Visit Diagnoses:  1. Low back pain, unspecified back pain laterality, unspecified chronicity, unspecified whether sciatica present   2. Right lumbar radiculopathy     Plan: Patient is a 64 year old female who presents complaining of lumbar back pain with right leg radicular symptoms.  She has tried a conservative course of meloxicam without any significant relief.  She has been doing with his pain for about 3 months and it is not improving at all.  Ordered MRI of the lumbar spine to evaluate right-sided  lumbar radiculopathy.  Additionally, patient will attend physical therapy for 1-2 sessions with transition to home exercise program to see if this will help in the meantime between this office visit and the point where she can get ESI's based on her MRI scan.  Patient agreed with this plan and will follow up after MRI to review results.  Follow-Up Instructions: No follow-ups on file.   Orders:  Orders Placed This Encounter  Procedures  . MR Lumbar Spine w/o contrast  . Ambulatory referral to Physical Therapy   No orders of the defined types were placed in this encounter.     Procedures: No procedures performed   Clinical Data: No additional findings.  Objective: Vital Signs: There were no vitals taken for this visit.  Physical Exam:  Constitutional: Patient appears well-developed HEENT:  Head: Normocephalic Eyes:EOM are normal Neck: Normal range of motion Cardiovascular: Normal rate Pulmonary/chest: Effort normal Neurologic: Patient is alert Skin: Skin is warm Psychiatric: Patient has normal mood and affect  Ortho Exam:  Tender to palpation throughout the axial lumbar spine.  Positive straight leg raise on the right.  Negative straight leg raise on the left.  No evidence of hypo or hyperreflexia throughout the bilateral lower extremities.  5/5 motor strength of the bilateral hip  flexors, quadriceps, hamstring, dorsiflexion, plantarflexion.  No loss of sensation throughout all dermatomes of the bilateral lower extremities.  Specialty Comments:  No specialty comments available.  Imaging: No results found.   PMFS History: Patient Active Problem List   Diagnosis Date Noted  . Prediabetes 02/05/2019  . Depression 02/05/2019  . Dry skin 06/02/2018  . Pelvic pain 02/19/2018  . Chronic back pain 08/09/2017  . Primary osteoarthritis of right shoulder 09/20/2016  . Alopecia 01/10/2016  . Obesity 09/20/2015  . Preventative health care 09/08/2015  . Former smoker  09/08/2014  . HLD (hyperlipidemia) 09/01/2013  . Vitamin D deficiency 07/03/2010  . Vaginal atrophy 06/30/2009  . Allergic rhinitis 03/01/2009   Past Medical History:  Diagnosis Date  . Allergic rhinitis   . Allergy   . Arthritis   . Arthritis of low back   . Back pain   . Cataract   . Cataracts, bilateral   . Constipation   . Dry skin    on hands  . Fibroid uterus   . Hyperlipidemia   . Joint pain   . Osteoarthritis of right shoulder   . Ringing in ears   . Sciatica    sciatica  . Vitamin D deficiency     Family History  Problem Relation Age of Onset  . Colon cancer Sister   . Colon polyps Sister   . Diabetes Mother        father and 9 siblings  . Kidney disease Mother        renal failure  . Hypertension Mother   . Hyperlipidemia Mother   . Stroke Mother   . Depression Mother   . Anxiety disorder Mother   . Obesity Mother   . Heart disease Father        paternal grandmother  . Obesity Father   . Diabetes Father   . Hypertension Father   . Hyperlipidemia Father   . Eating disorder Father   . Esophageal cancer Neg Hx   . Rectal cancer Neg Hx   . Stomach cancer Neg Hx     Past Surgical History:  Procedure Laterality Date  . ABDOMINAL HYSTERECTOMY    . broken ankle    . CARPAL TUNNEL RELEASE Bilateral   . SHOULDER SURGERY Right    Social History   Occupational History  . Occupation: Retired [employer]  Tobacco Use  . Smoking status: Former Smoker    Packs/day: 1.00    Years: 29.00    Pack years: 29.00    Types: Cigarettes  . Smokeless tobacco: Never Used  . Tobacco comment: 30 years  Substance and Sexual Activity  . Alcohol use: No  . Drug use: No  . Sexual activity: Not on file

## 2019-10-26 ENCOUNTER — Encounter: Payer: Self-pay | Admitting: Orthopedic Surgery

## 2019-11-06 ENCOUNTER — Encounter: Payer: Self-pay | Admitting: Physical Therapy

## 2019-11-06 ENCOUNTER — Other Ambulatory Visit: Payer: Self-pay

## 2019-11-06 ENCOUNTER — Ambulatory Visit (INDEPENDENT_AMBULATORY_CARE_PROVIDER_SITE_OTHER): Admitting: Physical Therapy

## 2019-11-06 DIAGNOSIS — M5416 Radiculopathy, lumbar region: Secondary | ICD-10-CM | POA: Diagnosis not present

## 2019-11-06 DIAGNOSIS — R29898 Other symptoms and signs involving the musculoskeletal system: Secondary | ICD-10-CM | POA: Diagnosis not present

## 2019-11-06 NOTE — Therapy (Signed)
[facility] [address] [place], [place], [address] Phone: [phone]   Fax:  [phone]  Physical Therapy Treatment  Patient Details  Name: Jillian [name] MRN: [id number] Date of Birth: [birth year] Referring Provider (PT): Marlou Sa Tonna Corner, MD   Encounter Date: 11/06/2019  PT End of Session - 11/06/19 1601    Visit Number  1    Number of Visits  4    Date for PT Re-Evaluation  12/04/19    PT Start Time  [id number]    PT Stop Time  1055    PT Time Calculation (min)  40 min    Activity Tolerance  Patient tolerated treatment well    Behavior During Therapy  [facility] for tasks assessed/performed       Past Medical History:  Diagnosis Date  . Allergic rhinitis   . Allergy   . Arthritis   . Arthritis of low back   . Back pain   . Cataract   . Cataracts, bilateral   . Constipation   . Dry skin    on hands  . Fibroid uterus   . Hyperlipidemia   . Joint pain   . Osteoarthritis of right shoulder   . Ringing in ears   . Sciatica    sciatica  . Vitamin D deficiency     Past Surgical History:  Procedure Laterality Date  . ABDOMINAL HYSTERECTOMY    . broken ankle    . CARPAL TUNNEL RELEASE Bilateral   . SHOULDER SURGERY Right     There were no vitals filed for this visit.  Subjective Assessment - 11/06/19 1018    Subjective  Pt is a 64 y/o female who presents to OPPT with chronic LBP.  Pt reports symptoms improved since retiring, but has has increased pain for the past 4 months starting in Rt groin going to foot.  She describes the symptoms as "having a TENS unit going down there."  Pt has MRI scheduled for 4/3.    Limitations  Sitting    Patient Stated Goals  improve pain and symptoms    Currently in Pain?  Yes    Pain Score  0-No pain   up to 7/10   Pain Location  Back    Pain Orientation  Right;Lower    Pain Descriptors / Indicators  Pins and needles;Tingling    Pain Type  Acute pain;Chronic pain    Pain Onset  More than  a month ago    Pain Frequency  Intermittent    Aggravating Factors   sitting, lying    Pain Relieving Factors  "getting off the nerve" standing, walking         [facility] PT Assessment - 11/06/19 1022      Assessment   Medical Diagnosis  M54.5 (ICD-10-CM) - Low back pain, unspecified back pain laterality, unspecified chronicity, unspecified whether sciatica present    Referring Provider (PT)  Meredith Pel, MD    Onset Date/Surgical Date  --   4 months   Hand Dominance  Right    Next MD Visit  11/25/19    Prior Therapy  chiropractic care      Precautions   Precautions  None      Restrictions   Weight Bearing Restrictions  No      Balance Screen   Has the patient fallen in the past 6 months  No    Has the patient had a decrease in activity level because of a fear of falling?  No    Is the patient reluctant to leave their home because of a fear of falling?   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    Leisure  gardening, walking dogs; exercise 30 min/day (some light exercise, elliptical)      Cognition   Overall Cognitive Status  Within Functional Limits for tasks assessed      Posture/Postural Control   Posture/Postural Control  Postural limitations    Postural Limitations  Rounded Shoulders;Forward head      ROM / Strength   AROM / PROM / Strength  AROM;Strength      AROM   Overall AROM Comments  bil quadrant WNL - has extension preference with centralization with extension    AROM Assessment Site  Lumbar    Lumbar Flexion  WNL    Lumbar Extension  WNL      Strength   Strength Assessment Site  Hip;Knee;Ankle    Right/Left Hip  Right;Left    Right Hip Flexion  5/5     Left Hip Flexion  5/5    Right/Left Knee  Right;Left    Right Knee Flexion  5/5    Right Knee Extension  5/5    Left Knee Flexion  5/5    Left Knee Extension  5/5    Right/Left Ankle  Right;Left    Right Ankle Dorsiflexion  5/5    Left Ankle Dorsiflexion  5/5      Palpation   Palpation comment  trigger point noted in Rt adductor - no reproduction of symptoms      Special Tests    Special Tests  Lumbar    Lumbar Tests  Slump Test;Straight Leg Raise      Slump test   Findings  Negative      Straight Leg Raise   Findings  Negative                   [facility] Adult PT Treatment/Exercise - 11/06/19 1022      Exercises   Exercises  Lumbar      Lumbar Exercises: Stretches   Prone on Elbows Stretch Limitations  instructed for home x 3 min    Press Ups Limitations  instructed for home 10x10 sec    Other Lumbar Stretch Exercise  Rt lateral shift correction 10x10 sec             PT Education - 11/06/19 1058    Education Details  HEP    Person(s) Educated  Patient    Methods  Explanation;Demonstration;Handout    Comprehension  Verbalized understanding;Returned demonstration;Need further instruction          PT Long Term Goals - 11/06/19 1607      PT LONG TERM GOAL #1   Title  independent with HEP    Status  New    Target Date  12/04/19      PT LONG TERM GOAL #2   Title  report centralization of symptoms for improved function    Status  New    Target Date  12/04/19      PT LONG  TERM GOAL #3   Title  report 50% reduction in pain and symptoms with sitting ans lying down for improved sleep quality and decreased pain    Status  New    Target Date  12/04/19            Plan - 11/06/19 1045    Clinical Impression Statement  Pt is a 64 y/o female who presents to OPPT for exacerbation of chronic LBP with radiculopathy.  Negative SLR and slump test today but does report centralization with extension based exercises so issued these for home along with Rt  lateral shift correction.  Will plan to follow up with pt 1x/wk for up to 4 weeks to assess response to exercise and modify/update PRN.  Pt does have some trigger points noted in Rt adductor along distribution of symptoms, however unable to reproduce pain with palpation today.  Will benefit from PT to address deficits.    Personal Factors and Comorbidities  Comorbidity 1;Time since onset of injury/illness/exacerbation    Comorbidities  arthritis    Examination-Activity Limitations  Sit;Transfers;Sleep    Examination-Participation Restrictions  Yard Work    Stability/Clinical Decision Making  Evolving/Moderate complexity    Clinical Decision Making  Moderate    Rehab Potential  Good    PT Frequency  1x / week    PT Duration  4 weeks    PT Treatment/Interventions  ADLs/Self Care Home Management;Cryotherapy;Electrical Stimulation;Ultrasound;Traction;Moist Heat;Functional mobility training;Therapeutic activities;Therapeutic exercise;Patient/family education;Neuromuscular re-education;Manual techniques;Taping;Dry needling;Spinal Manipulations    PT Next Visit Plan  review HEP and update - would be good to have some core/hip strengthening exercises, assess response to exercise    PT Home Exercise Plan  Access Code: [email or web address]    Consulted and Agree with Plan of Care  Patient       Patient will benefit from skilled therapeutic intervention in order to improve the following deficits and impairments:  Increased fascial restricitons, Increased muscle spasms, Pain, Impaired flexibility  Visit Diagnosis: Radiculopathy, lumbar region - Plan: PT plan of care cert/re-cert  Other symptoms and signs involving the musculoskeletal system - Plan: PT plan of care cert/re-cert     Problem List Patient Active Problem List   Diagnosis Date Noted  . Prediabetes 02/05/2019  . Depression 02/05/2019  . Dry skin 06/02/2018  . Pelvic pain 02/19/2018  . Chronic back pain 08/09/2017  . Primary osteoarthritis of  right shoulder 09/20/2016  . Alopecia 01/10/2016  . Obesity 09/20/2015  . Preventative health care 09/08/2015  . Former smoker 09/08/2014  . HLD (hyperlipidemia) 09/01/2013  . Vitamin D deficiency 07/03/2010  . Vaginal atrophy 06/30/2009  . Allergic rhinitis 03/01/2009      Laureen Abrahams, PT, DPT 11/06/19 4:14 PM   [facility] [address] [place], [place], [address] Phone: [phone]   Fax:  [phone]  Name: Jillian [name] MRN: [id number] Date of Birth: 1956/05/10

## 2019-11-06 NOTE — Patient Instructions (Signed)
Access Code: [email or web address] URL: [email or web address] Date: 11/06/2019 Prepared by: Faustino Congress  Exercises Right Standing Lateral Shift Correction at [place] 5 x daily - 7 x weekly - 1 sets - 10 reps - 10 sec hold Prone on Elbows Stretch - 5 x daily - 7 x weekly - 1 sets - 1 reps - 3 min hold Prone Press Up on Elbows - 5 x daily - 7 x weekly - 1 sets - 10 reps - 10 sec hold

## 2019-11-10 ENCOUNTER — Ambulatory Visit (INDEPENDENT_AMBULATORY_CARE_PROVIDER_SITE_OTHER): Admitting: Physical Therapy

## 2019-11-10 ENCOUNTER — Other Ambulatory Visit: Payer: Self-pay

## 2019-11-10 DIAGNOSIS — R29898 Other symptoms and signs involving the musculoskeletal system: Secondary | ICD-10-CM

## 2019-11-10 DIAGNOSIS — M5416 Radiculopathy, lumbar region: Secondary | ICD-10-CM | POA: Diagnosis not present

## 2019-11-10 NOTE — Therapy (Signed)
[facility] [address] [place], [place], [address] Phone: [phone]   Fax:  [phone]  Physical Therapy Treatment  Patient Details  Name: Jillian [name] MRN: [id number] Date of Birth: August 31, 1955 Referring Provider (PT): Marlou Sa Tonna Corner, MD   Encounter Date: 11/10/2019  PT End of Session - 11/10/19 1229    Visit Number  2    Number of Visits  4    Date for PT Re-Evaluation  12/04/19    PT Start Time  1140    PT Stop Time  1225    PT Time Calculation (min)  45 min    Activity Tolerance  Patient tolerated treatment well    Behavior During Therapy  [facility] for tasks assessed/performed       Past Medical History:  Diagnosis Date  . Allergic rhinitis   . Allergy   . Arthritis   . Arthritis of low back   . Back pain   . Cataract   . Cataracts, bilateral   . Constipation   . Dry skin    on hands  . Fibroid uterus   . Hyperlipidemia   . Joint pain   . Osteoarthritis of right shoulder   . Ringing in ears   . Sciatica    sciatica  . Vitamin D deficiency     Past Surgical History:  Procedure Laterality Date  . ABDOMINAL HYSTERECTOMY    . broken ankle    . CARPAL TUNNEL RELEASE Bilateral   . SHOULDER SURGERY Right     There were no vitals filed for this visit.  Subjective Assessment - 11/10/19 1156    Subjective  My Rt leg is still having tingling from groin down to the foot. It does not do this all the time. It did it when I got out of the car but at this moment it is not. I have been doing the exercises and it makes the leg feel different,    Limitations  Sitting    Patient Stated Goals  improve pain and symptoms    Pain Onset  More than a month ago        [facility] Adult PT Treatment/Exercise - 11/10/19 0001      Lumbar Exercises: Stretches   Active Hamstring Stretch  Right;3 reps;30 seconds    Active Hamstring Stretch Limitations  supine with strap    Prone on Elbows Stretch Limitations  1 min X 3 reps     Press Ups Limitations  5 sec hold 2X10 reps    Piriformis Stretch  Right;3 reps;30 seconds    Piriformis Stretch Limitations  knee to opp shoulder    Other Lumbar Stretch Exercise  Rt lateral shift correction 5 sec hold 3 sets of 10 reps    Other Lumbar Stretch Exercise  supine groin butterfly stretch with self O.P to Rt knee 30 sec X 3      Lumbar Exercises: Aerobic   Recumbent Bike  L2 X 6 min      Manual Therapy   Manual therapy comments  in prone for central PA mobs grade 2-3 to lower lumbar spine, then T.P. release to glutes/piriformis to Rt side, then moved to supine for LAD with foot in ER 60 sec X 2, then with foot in IR 60 sec X2             PT Education - 11/10/19 1228    Education Details  added additional stretches to HEP and encouraged Long axis distraction from a  family member    Person(s) Educated  Patient    Methods  Explanation    Comprehension  Verbalized understanding;Returned demonstration;Need further instruction          PT Long Term Goals - 11/06/19 1607      PT LONG TERM GOAL #1   Title  independent with HEP    Status  New    Target Date  12/04/19      PT LONG TERM GOAL #2   Title  report centralization of symptoms for improved function    Status  New    Target Date  12/04/19      PT LONG TERM GOAL #3   Title  report 50% reduction in pain and symptoms with sitting ans lying down for improved sleep quality and decreased pain    Status  New    Target Date  12/04/19            Plan - 11/10/19 1229    Clinical Impression Statement  She had good response to [facility] program consisting of Rt latreal shift correction followed by repeated extensions. Then added further stretches for hip and groin and she had good tolerance and understanding to these. She then appeared to further benefit from manual therapy today. PT will assess her response to updated HEP next session.    Personal Factors and Comorbidities  Comorbidity 1;Time since onset of  injury/illness/exacerbation    Comorbidities  arthritis    Examination-Activity Limitations  Sit;Transfers;Sleep    Examination-Participation Restrictions  Yard Work    Stability/Clinical Decision Making  Evolving/Moderate complexity    Rehab Potential  Good    PT Frequency  1x / week    PT Duration  4 weeks    PT Treatment/Interventions  ADLs/Self Care Home Management;Cryotherapy;Electrical Stimulation;Ultrasound;Traction;Moist Heat;Functional mobility training;Therapeutic activities;Therapeutic exercise;Patient/family education;Neuromuscular re-education;Manual techniques;Taping;Dry needling;Spinal Manipulations    PT Next Visit Plan  review HEP and update - would be good to have some core/hip strengthening exercises, assess response to exercise    PT Home Exercise Plan  Access Code: [email or web address]    Consulted and Agree with Plan of Care  Patient       Patient will benefit from skilled therapeutic intervention in order to improve the following deficits and impairments:  Increased fascial restricitons, Increased muscle spasms, Pain, Impaired flexibility  Visit Diagnosis: Radiculopathy, lumbar region  Other symptoms and signs involving the musculoskeletal system     Problem List Patient Active Problem List   Diagnosis Date Noted  . Prediabetes 02/05/2019  . Depression 02/05/2019  . Dry skin 06/02/2018  . Pelvic pain 02/19/2018  . Chronic back pain 08/09/2017  . Primary osteoarthritis of right shoulder 09/20/2016  . Alopecia 01/10/2016  . Obesity 09/20/2015  . Preventative health care 09/08/2015  . Former smoker 09/08/2014  . HLD (hyperlipidemia) 09/01/2013  . Vitamin D deficiency 07/03/2010  . Vaginal atrophy 06/30/2009  . Allergic rhinitis 03/01/2009    Debbe Odea, PT,DPT 11/10/2019, 12:33 PM  [facility] [address] [place], [place], [address] Phone: [phone]   Fax:  [phone]  Name: Jillian [name] MRN:  [id number] Date of Birth: Apr 05, 1956

## 2019-11-10 NOTE — Patient Instructions (Signed)
Access Code: [email or web address]: [email or web address]: 03/23/2021Prepared by: Aaron Edelman NelsonExercises  Right Standing Lateral Shift Correction at [place] - 5 x daily - 7 x weekly - 1 sets - 10 reps - 10 sec hold  Prone on Elbows Stretch - 5 x daily - 7 x weekly - 1 sets - 1 reps - 3 min hold  Prone Press Up on Elbows - 5 x daily - 7 x weekly - 1 sets - 10 reps - 10 sec hold  Supine Piriformis Stretch with Foot on Ground - 2 x daily - 6 x weekly - 3 sets - 30 hold  Supine Hamstring Stretch with Strap - 2 x daily - 6 x weekly - 3 reps - 1 sets - 30 hold  Supine Butterfly Groin Stretch - 2 x daily - 6 x weekly - 1 sets - 3 reps - 30 hold

## 2019-11-19 ENCOUNTER — Ambulatory Visit (INDEPENDENT_AMBULATORY_CARE_PROVIDER_SITE_OTHER): Admitting: Physical Therapy

## 2019-11-19 ENCOUNTER — Encounter: Admitting: Physical Therapy

## 2019-11-19 ENCOUNTER — Other Ambulatory Visit: Payer: Self-pay

## 2019-11-19 ENCOUNTER — Ambulatory Visit: Attending: Internal Medicine

## 2019-11-19 DIAGNOSIS — Z23 Encounter for immunization: Secondary | ICD-10-CM

## 2019-11-19 DIAGNOSIS — M5416 Radiculopathy, lumbar region: Secondary | ICD-10-CM

## 2019-11-19 DIAGNOSIS — R29898 Other symptoms and signs involving the musculoskeletal system: Secondary | ICD-10-CM | POA: Diagnosis not present

## 2019-11-19 NOTE — Therapy (Addendum)
[name] [address] [place], [place], [address] Phone: [phone]   Fax:  [phone]  Physical Therapy Treatment/Discharge addendum PHYSICAL THERAPY DISCHARGE SUMMARY  Visits from Start of Care: 3  Current functional level related to goals / functional outcomes: See below  Remaining deficits: See below   Education / Equipment: HEP  Plan: Patient agrees to discharge.  Patient goals were partially met. Patient is being discharged due to not returning since the last visit.  ?????  Jillian [name], PT, DPT 01/27/20 11:13 AM      Patient Details  Name: Jillian [name] MRN: [id number] Date of Birth: 26-Dec-1955 Referring Provider (PT): Marlou Sa Tonna Corner, MD   Encounter Date: 11/19/2019  PT End of Session - 11/19/19 0851    Visit Number  3    Number of Visits  4    Date for PT Re-Evaluation  12/04/19    PT Start Time  0800    PT Stop Time  0845    PT Time Calculation (min)  45 min    Activity Tolerance  Patient tolerated treatment well    Behavior During Therapy  [facility] for tasks assessed/performed       Past Medical History:  Diagnosis Date  . Allergic rhinitis   . Allergy   . Arthritis   . Arthritis of low back   . Back pain   . Cataract   . Cataracts, bilateral   . Constipation   . Dry skin    on hands  . Fibroid uterus   . Hyperlipidemia   . Joint pain   . Osteoarthritis of right shoulder   . Ringing in ears   . Sciatica    sciatica  . Vitamin D deficiency     Past Surgical History:  Procedure Laterality Date  . ABDOMINAL HYSTERECTOMY    . broken ankle    . CARPAL TUNNEL RELEASE Bilateral   . SHOULDER SURGERY Right     There were no vitals filed for this visit.  Subjective Assessment - 11/19/19 0849    Subjective  My Rt leg pain and radiculopathy is 75% better, I have been doing my HEP. I am not having any radiculopathy today    Limitations  Sitting    Patient Stated Goals  improve pain and  symptoms    Currently in Pain?  No/denies    Pain Onset  More than a month ago                       [name] Adult PT Treatment/Exercise - 11/19/19 0001      Lumbar Exercises: Stretches   Active Hamstring Stretch  Right;Left;3 reps;30 seconds    Prone on Elbows Stretch Limitations  1 min X 3 reps    Press Ups Limitations  5 sec hold 2X10 reps    Piriformis Stretch  Right;Left;3 reps;30 seconds    Other Lumbar Stretch Exercise  Rt lateral shift correction 10 reps    Other Lumbar Stretch Exercise  supine groin butterfly stretch with self O.P to Rt knee 30 sec X 3      Lumbar Exercises: Aerobic   Recumbent Bike  L2 X 6 min      Lumbar Exercises: Standing   Other Standing Lumbar Exercises  rows and shldr extensions green X 20 ea with TAC      Lumbar Exercises: Supine   Bridge  15 reps    Bridge Limitations  5 sec hold  Lumbar Exercises: Quadruped   Opposite Arm/Leg Raise  5 reps    Opposite Arm/Leg Raise Limitations  cues for stable neutral spine      Manual Therapy   Manual therapy comments  in prone for central PA mobs grade 2-3 to lower lumbar spine, then T.P. release to glutes/piriformis to Rt side                  PT Long Term Goals - 11/19/19 0854      PT LONG TERM GOAL #1   Title  independent with HEP    Status  Achieved      PT LONG TERM GOAL #2   Title  report centralization of symptoms for improved function    Status  Partially Met      PT LONG TERM GOAL #3   Title  report 50% reduction in pain and symptoms with sitting ans lying down for improved sleep quality and decreased pain    Status  Achieved            Plan - 11/19/19 0852    Clinical Impression Statement  She was not having radiculopathy today and shows good understaning of exercises. Progressed core exercises today without complaints. She has overall made great progress with PT. She still will have MRI and follow up with MD. PT will await further MD recommendatons  for POC.sa    Personal Factors and Comorbidities  Comorbidity 1;Time since onset of injury/illness/exacerbation    Comorbidities  arthritis    Examination-Activity Limitations  Sit;Transfers;Sleep    Examination-Participation Restrictions  Yard Work    Stability/Clinical Decision Making  Evolving/Moderate complexity    Rehab Potential  Good    PT Frequency  1x / week    PT Duration  4 weeks    PT Treatment/Interventions  ADLs/Self Care Home Management;Cryotherapy;Electrical Stimulation;Ultrasound;Traction;Moist Heat;Functional mobility training;Therapeutic activities;Therapeutic exercise;Patient/family education;Neuromuscular re-education;Manual techniques;Taping;Dry needling;Spinal Manipulations    PT Next Visit Plan  maybe DC or continue, what did MRI show and what did MD recommend?    PT Home Exercise Plan  Access Code: [email or web address]    Consulted and Agree with Plan of Care  Patient       Patient will benefit from skilled therapeutic intervention in order to improve the following deficits and impairments:  Increased fascial restricitons, Increased muscle spasms, Pain, Impaired flexibility  Visit Diagnosis: Radiculopathy, lumbar region  Other symptoms and signs involving the musculoskeletal system     Problem List Patient Active Problem List   Diagnosis Date Noted  . Prediabetes 02/05/2019  . Depression 02/05/2019  . Dry skin 06/02/2018  . Pelvic pain 02/19/2018  . Chronic back pain 08/09/2017  . Primary osteoarthritis of right shoulder 09/20/2016  . Alopecia 01/10/2016  . Obesity 09/20/2015  . Preventative health care 09/08/2015  . Former smoker 09/08/2014  . HLD (hyperlipidemia) 09/01/2013  . Vitamin D deficiency 07/03/2010  . Vaginal atrophy 06/30/2009  . Allergic rhinitis 03/01/2009    Debbe Odea, PT,DPT 11/19/2019, 9:00 AM  [facility] [address] [place], [place], [address] Phone: [phone]   Fax:  [phone]  Name:  Jillian [name] MRN: [id number] Date of Birth: Jun 26, 1956

## 2019-11-19 NOTE — Progress Notes (Signed)
   Covid-19 Vaccination Clinic  Name:  Jillian [name]    MRN: [id number] DOB: 04/11/1956  11/19/2019  [name] was observed post Covid-19 immunization for 15 minutes without incident. She was provided with Vaccine Information Sheet and instruction to access the V-Safe system.   [name] was instructed to call 911 with any severe reactions post vaccine: Marland Kitchen Difficulty breathing  . Swelling of face and throat  . A fast heartbeat  . A bad rash all over body  . Dizziness and weakness   Immunizations Administered    Name Date Dose VIS Date Route   Pfizer COVID-19 Vaccine 11/19/2019  9:38 AM 0.3 mL 07/31/2019 Intramuscular   Manufacturer: [place]   Lot: [id number]   [place]: [id number]

## 2019-11-21 ENCOUNTER — Other Ambulatory Visit: Payer: Self-pay

## 2019-11-21 ENCOUNTER — Ambulatory Visit
Admission: RE | Admit: 2019-11-21 | Discharge: 2019-11-21 | Disposition: A | Discharge status: Home / Self Care | Source: Ambulatory Visit | Attending: Orthopedic Surgery | Admitting: Orthopedic Surgery

## 2019-11-21 DIAGNOSIS — M545 Low back pain, unspecified: Secondary | ICD-10-CM

## 2019-11-25 ENCOUNTER — Other Ambulatory Visit: Payer: Self-pay

## 2019-11-25 ENCOUNTER — Ambulatory Visit (INDEPENDENT_AMBULATORY_CARE_PROVIDER_SITE_OTHER): Admitting: Orthopedic Surgery

## 2019-11-25 DIAGNOSIS — M5126 Other intervertebral disc displacement, lumbar region: Secondary | ICD-10-CM | POA: Diagnosis not present

## 2019-11-25 DIAGNOSIS — M7711 Lateral epicondylitis, right elbow: Secondary | ICD-10-CM

## 2019-11-27 ENCOUNTER — Encounter: Payer: Self-pay | Admitting: Orthopedic Surgery

## 2019-11-27 NOTE — Progress Notes (Signed)
Office Visit Note   Patient: Jillian [name]           Date of Birth: 12-23-1955           MRN: [id number] Visit Date: 11/25/2019 Requested by: Pleas Koch, NP [place],  [place] [address] PCP: Pleas Koch, NP  Subjective: Chief Complaint  Patient presents with  . Lower Back - Follow-up    HPI: Jillian [name] is a 64 y.o. female who presents to the office complaining of bilateral radicular leg pain.  Patient returns to discuss MRI results.  MRI of the lumbar spine revealed L4-L5 mild spinal canal stenosis due to intermediate sized disc bulge as well as mild left L3 neural foraminal stenosis.  Patient describes her symptoms as bilateral radicular pain that extends from her back into her groin and down to her heel.  She notes that with the formal physical therapy she is gone to she feels 70 to 80% better.  She has now transition to home exercise program.  She has had ESI's in the past with good relief.  She is currently not taking any medications for pain aside from the occasional Tylenol or meloxicam.  Additionally she complains of right elbow pain that is new in onset over the last 3 weeks ever since doing some yard work.  She localizes the pain to the lateral epicondyle.  She notes pain with some activities especially with gripping something.  She has never had such pain before.              ROS:  All systems reviewed are negative as they relate to the chief complaint within the history of present illness.  Patient denies fevers or chills.  Assessment & Plan: Visit Diagnoses:  1. Lumbar disc herniation   2. Right tennis elbow     Plan: Patient is a 64 year old female who presents for review of MRI lumbar spine.  MRI findings are as described above in HPI.  Her symptoms are 70 to 80% better by her own account.  She has had ESI's in the past with good relief but while she has had great relief with physical therapy we will hold off on these ESI's  until her pain potentially returns in the future.  Patient agrees with this plan.  Additionally she notes new onset right elbow pain in the last 3 weeks.  She is tender to palpation over the lateral epicondyle and has pain with resisted wrist extension and with heavy grip.  Impression is right tennis elbow.  Recommended that she try a counterforce brace as well as incorporate a stretching routine of the wrist extensors.  Patient agreed with this plan and will follow up as needed.  Follow-Up Instructions: No follow-ups on file.   Orders:  No orders of the defined types were placed in this encounter.  No orders of the defined types were placed in this encounter.     Procedures: No procedures performed   Clinical Data: No additional findings.  Objective: Vital Signs: There were no vitals taken for this visit.  Physical Exam:  Constitutional: Patient appears well-developed HEENT:  Head: Normocephalic Eyes:EOM are normal Neck: Normal range of motion Cardiovascular: Normal rate Pulmonary/chest: Effort normal Neurologic: Patient is alert Skin: Skin is warm Psychiatric: Patient has normal mood and affect  Ortho Exam:  Negative straight leg raise bilaterally.  5/5 motor strength of the bilateral hip flexors, quadriceps, hamstring, dorsiflexion, plantarflexion.  Sensation intact through all dermatomes  of the bilateral lower extremities.  Tender to palpation over the lateral epicondyle of the right elbow.  No significant tenderness to palpation over the medial epicondyle, distal biceps tendon, olecranon bursa.  Pain with resisted wrist extension of the right wrist is elicited at the lateral epicondyle.  Pain at the lateral epicondyle elicited with heavy grip of the right hand.  Specialty Comments:  No specialty comments available.  Imaging: No results found.   PMFS History: Patient Active Problem List   Diagnosis Date Noted  . Prediabetes 02/05/2019  . Depression 02/05/2019  .  Dry skin 06/02/2018  . Pelvic pain 02/19/2018  . Chronic back pain 08/09/2017  . Primary osteoarthritis of right shoulder 09/20/2016  . Alopecia 01/10/2016  . Obesity 09/20/2015  . Preventative health care 09/08/2015  . Former smoker 09/08/2014  . HLD (hyperlipidemia) 09/01/2013  . Vitamin D deficiency 07/03/2010  . Vaginal atrophy 06/30/2009  . Allergic rhinitis 03/01/2009   Past Medical History:  Diagnosis Date  . Allergic rhinitis   . Allergy   . Arthritis   . Arthritis of low back   . Back pain   . Cataract   . Cataracts, bilateral   . Constipation   . Dry skin    on hands  . Fibroid uterus   . Hyperlipidemia   . Joint pain   . Osteoarthritis of right shoulder   . Ringing in ears   . Sciatica    sciatica  . Vitamin D deficiency     Family History  Problem Relation Age of Onset  . Colon cancer Sister   . Colon polyps Sister   . Diabetes Mother        father and 9 siblings  . Kidney disease Mother        renal failure  . Hypertension Mother   . Hyperlipidemia Mother   . Stroke Mother   . Depression Mother   . Anxiety disorder Mother   . Obesity Mother   . Heart disease Father        paternal grandmother  . Obesity Father   . Diabetes Father   . Hypertension Father   . Hyperlipidemia Father   . Eating disorder Father   . Esophageal cancer Neg Hx   . Rectal cancer Neg Hx   . Stomach cancer Neg Hx     Past Surgical History:  Procedure Laterality Date  . ABDOMINAL HYSTERECTOMY    . broken ankle    . CARPAL TUNNEL RELEASE Bilateral   . SHOULDER SURGERY Right    Social History   Occupational History  . Occupation: Retired [employer]  Tobacco Use  . Smoking status: Former Smoker    Packs/day: 1.00    Years: 29.00    Pack years: 29.00    Types: Cigarettes  . Smokeless tobacco: Never Used  . Tobacco comment: 30 years  Substance and Sexual Activity  . Alcohol use: No  . Drug use: No  . Sexual activity: Not on file

## 2019-12-14 ENCOUNTER — Ambulatory Visit: Attending: Internal Medicine

## 2019-12-14 DIAGNOSIS — Z23 Encounter for immunization: Secondary | ICD-10-CM

## 2019-12-14 NOTE — Progress Notes (Signed)
   Covid-19 Vaccination Clinic  Name:  Jillian [name]    MRN: [id number] DOB: Jan 24, 1956  12/14/2019  Jillian [name] was observed post Covid-19 immunization for 15 minutes without incident. She was provided with Vaccine Information Sheet and instruction to access the V-Safe system.   Jillian [name] was instructed to call 911 with any severe reactions post vaccine: Marland Kitchen Difficulty breathing  . Swelling of face and throat  . A fast heartbeat  . A bad rash all over body  . Dizziness and weakness   Immunizations Administered    Name Date Dose VIS Date Route   Pfizer COVID-19 Vaccine 12/14/2019  9:40 AM 0.3 mL 10/14/2018 Intramuscular   Manufacturer: [place]   Lot: [id number]   [place]: [id number]

## 2019-12-24 ENCOUNTER — Other Ambulatory Visit: Payer: Self-pay

## 2019-12-24 ENCOUNTER — Other Ambulatory Visit: Payer: Self-pay | Admitting: Primary Care

## 2019-12-24 ENCOUNTER — Ambulatory Visit (INDEPENDENT_AMBULATORY_CARE_PROVIDER_SITE_OTHER): Admitting: Surgical

## 2019-12-24 ENCOUNTER — Ambulatory Visit: Payer: Self-pay

## 2019-12-24 DIAGNOSIS — J309 Allergic rhinitis, unspecified: Secondary | ICD-10-CM

## 2019-12-24 DIAGNOSIS — M7711 Lateral epicondylitis, right elbow: Secondary | ICD-10-CM | POA: Diagnosis not present

## 2019-12-24 DIAGNOSIS — M25521 Pain in right elbow: Secondary | ICD-10-CM

## 2019-12-24 MED ORDER — MELOXICAM 15 MG PO TABS: 15.0000 mg | ORAL_TABLET | Freq: Every day | ORAL | 1 refills | Status: DC

## 2019-12-24 MED ORDER — NITROGLYCERIN 0.2 MG/HR TD PT24: 0.2000 mg | MEDICATED_PATCH | TRANSDERMAL | 0 refills | Status: DC

## 2019-12-26 ENCOUNTER — Encounter: Payer: Self-pay | Admitting: Surgical

## 2019-12-26 NOTE — Progress Notes (Signed)
Office Visit Note   Patient: Jillian [name]           Date of Birth: Dec 06, 1955           MRN: [id number] Visit Date: 12/24/2019 Requested by: Pleas Koch, NP [place],  [place] [address] PCP: Pleas Koch, NP  Subjective: Chief Complaint  Patient presents with  . Right Elbow - Pain    HPI: Jillian [name] is a 64 y.o. female who presents to the office complaining of right elbow pain.  She returns for reevaluation of right elbow pain.  Impression at her last visit was tennis elbow of the right elbow.  She has been using a counterforce brace with good relief.  She is also doing a home exercise program consisting of a stretching routine twice a day.  She is taking meloxicam.  Her pain is improving but making very slow progress.  She requests radiographs of the right elbow.  She notes no new injury..                ROS:  All systems reviewed are negative as they relate to the chief complaint within the history of present illness.  Patient denies fevers or chills.  Assessment & Plan: Visit Diagnoses:  1. Pain in right elbow   2. Right tennis elbow     Plan: Patient is a 65 year old female presents complaining of right elbow pain.  She is here for follow-up of right tennis elbow.  She requests right elbow radiographs.  Radiographs taken today reveal no significant degenerative changes of the right elbow joint or any fractures/dislocations.  There is a small spur of the lateral epicondyle.  Impression remains right tennis elbow.  Recommend patient continue using counterforce brace and taking meloxicam.  Meloxicam was refilled.  Also patient may use nitroglycerin patches to help with increasing blood supply to the affected area.  Discussed PRP injection but patient wants to hold off for now and will consider this in the future.  Plan for patient to return in 2 months for clinical recheck.  Follow-Up Instructions: No follow-ups on file.   Orders:   Orders Placed This Encounter  Procedures  . XR Elbow 2 Views Right   Meds ordered this encounter  Medications  . meloxicam (MOBIC) 15 MG tablet    Sig: Take 1 tablet (15 mg total) by mouth daily.    Dispense:  30 tablet    Refill:  1  . nitroGLYCERIN (NITRODUR - DOSED IN MG/24 HR) 0.2 mg/hr patch    Sig: Place 1 patch (0.2 mg total) onto the skin every other day. Place in a slightly different location around the area of maximal tenderness and alternate the sites.  Do not place patch on the same site 2 days in a row    Dispense:  30 patch    Refill:  0      Procedures: No procedures performed   Clinical Data: No additional findings.  Objective: Vital Signs: There were no vitals taken for this visit.  Physical Exam:  Constitutional: Patient appears well-developed HEENT:  Head: Normocephalic Eyes:EOM are normal Neck: Normal range of motion Cardiovascular: Normal rate Pulmonary/chest: Effort normal Neurologic: Patient is alert Skin: Skin is warm Psychiatric: Patient has normal mood and affect  Ortho Exam:  Right elbow exam Tender to palpation over the lateral epicondyle.  No tenderness to palpation over the distal biceps tendon, olecranon bursa, medial epicondyle.  No swelling over the olecranon  bursa.  Distal biceps tendon is intact.  No loss of range of motion of the elbow.  Pain elicited at the lateral epicondyle with resisted wrist extension and with heavy grip strength.  No tenderness to palpation over the posterior interosseous nerve.  Specialty Comments:  No specialty comments available.  Imaging: No results found.   PMFS History: Patient Active Problem List   Diagnosis Date Noted  . Prediabetes 02/05/2019  . Depression 02/05/2019  . Dry skin 06/02/2018  . Pelvic pain 02/19/2018  . Chronic back pain 08/09/2017  . Primary osteoarthritis of right shoulder 09/20/2016  . Alopecia 01/10/2016  . Obesity 09/20/2015  . Preventative health care 09/08/2015  .  Former smoker 09/08/2014  . HLD (hyperlipidemia) 09/01/2013  . Vitamin D deficiency 07/03/2010  . Vaginal atrophy 06/30/2009  . Allergic rhinitis 03/01/2009   Past Medical History:  Diagnosis Date  . Allergic rhinitis   . Allergy   . Arthritis   . Arthritis of low back   . Back pain   . Cataract   . Cataracts, bilateral   . Constipation   . Dry skin    on hands  . Fibroid uterus   . Hyperlipidemia   . Joint pain   . Osteoarthritis of right shoulder   . Ringing in ears   . Sciatica    sciatica  . Vitamin D deficiency     Family History  Problem Relation Age of Onset  . Colon cancer Sister   . Colon polyps Sister   . Diabetes Mother        father and 9 siblings  . Kidney disease Mother        renal failure  . Hypertension Mother   . Hyperlipidemia Mother   . Stroke Mother   . Depression Mother   . Anxiety disorder Mother   . Obesity Mother   . Heart disease Father        paternal grandmother  . Obesity Father   . Diabetes Father   . Hypertension Father   . Hyperlipidemia Father   . Eating disorder Father   . Esophageal cancer Neg Hx   . Rectal cancer Neg Hx   . Stomach cancer Neg Hx     Past Surgical History:  Procedure Laterality Date  . ABDOMINAL HYSTERECTOMY    . broken ankle    . CARPAL TUNNEL RELEASE Bilateral   . SHOULDER SURGERY Right    Social History   Occupational History  . Occupation: Retired [employer]  Tobacco Use  . Smoking status: Former Smoker    Packs/day: 1.00    Years: 29.00    Pack years: 29.00    Types: Cigarettes  . Smokeless tobacco: Never Used  . Tobacco comment: 30 years  Substance and Sexual Activity  . Alcohol use: No  . Drug use: No  . Sexual activity: Not on file

## 2020-02-05 ENCOUNTER — Other Ambulatory Visit: Payer: Self-pay | Admitting: Primary Care

## 2020-02-05 DIAGNOSIS — F331 Major depressive disorder, recurrent, moderate: Secondary | ICD-10-CM

## 2020-02-24 ENCOUNTER — Ambulatory Visit: Admitting: Orthopedic Surgery

## 2020-02-25 ENCOUNTER — Telehealth: Payer: Self-pay | Admitting: Orthopedic Surgery

## 2020-02-25 ENCOUNTER — Ambulatory Visit (INDEPENDENT_AMBULATORY_CARE_PROVIDER_SITE_OTHER): Admitting: Orthopedic Surgery

## 2020-02-25 DIAGNOSIS — M7711 Lateral epicondylitis, right elbow: Secondary | ICD-10-CM

## 2020-02-25 NOTE — Telephone Encounter (Signed)
Made in error

## 2020-02-26 ENCOUNTER — Telehealth: Payer: Self-pay | Admitting: Orthopedic Surgery

## 2020-02-26 NOTE — Telephone Encounter (Signed)
Patient called stating she would like to go to [facility] in [place]     Fax# [phone]    The number to contact patient is [phone]

## 2020-02-26 NOTE — Telephone Encounter (Signed)
Faxed order to [facility] as requested

## 2020-02-27 ENCOUNTER — Encounter: Payer: Self-pay | Admitting: Orthopedic Surgery

## 2020-02-27 MED ORDER — NITROGLYCERIN 0.2 MG/HR TD PT24: 0.2000 mg | MEDICATED_PATCH | TRANSDERMAL | 0 refills | Status: DC

## 2020-02-27 NOTE — Progress Notes (Signed)
Office Visit Note   Patient: Jillian [name]           Date of Birth: 24-Oct-1955           MRN: [id number] Visit Date: 02/25/2020 Requested by: Pleas Koch, NP [place],  [place] [address] PCP: Pleas Koch, NP  Subjective: Chief Complaint  Patient presents with  . Right Elbow - Pain    HPI: Jillian [name] is a 64 y.o. female who presents to the office complaining of right elbow pain.  She returns for reevaluation of right tennis elbow.  She has had 4 to 5 months of pain.  She has been using a counterforce brace, home exercise program consisting of stretching, elliptical, nitroglycerin patches, massaging with little to moderate relief.  Overall her pain is not significantly improved compared with her previous office visit.  She has stopped meloxicam as she does not want to take anti-inflammatories for long period of time.  She now just takes Tylenol orally.  She is retired.  Much of her time is spent gardening and doing housework..                ROS:  All systems reviewed are negative as they relate to the chief complaint within the history of present illness.  Patient denies fevers or chills.  Assessment & Plan: Visit Diagnoses:  1. Right tennis elbow     Plan: Patient is a 64 year old female who presents for reevaluation of tennis elbow of the right elbow.  She has tried stretching at home, increasing her cardiovascular output via elliptical, nitroglycerin patches, counterforce brace, oral anti-inflammatories without significant relief.  Discussed options available to patient including PRP injection versus formal physical therapy.  Plan to refer patient to formal physical therapy 1-2 times per week over the next 8 weeks.  Follow-up in 8 weeks for clinical recheck.  If no improvement from physical therapy, will consider PRP injection at that time.  Patient agreed with plan.  Follow-up in 8 weeks.  Follow-Up Instructions: No follow-ups on file.     Orders:  Orders Placed This Encounter  Procedures  . Ambulatory referral to Physical Therapy   No orders of the defined types were placed in this encounter.     Procedures: No procedures performed   Clinical Data: No additional findings.  Objective: Vital Signs: There were no vitals taken for this visit.  Physical Exam:  Constitutional: Patient appears well-developed HEENT:  Head: Normocephalic Eyes:EOM are normal Neck: Normal range of motion Cardiovascular: Normal rate Pulmonary/chest: Effort normal Neurologic: Patient is alert Skin: Skin is warm Psychiatric: Patient has normal mood and affect  Ortho Exam:  Ortho exam demonstrates tenderness to palpation over the lateral epicondyle.  No significant tenderness to palpation of the medial epicondyle, distal biceps tendon, olecranon bursa, triceps tendon.  No tenderness palpation over the course of the radial nerve.  Pain is elicited at the lateral epicondyle with resisted wrist extension and with grip strength testing.  Specialty Comments:  No specialty comments available.  Imaging: No results found.   PMFS History: Patient Active Problem List   Diagnosis Date Noted  . Prediabetes 02/05/2019  . Depression 02/05/2019  . Dry skin 06/02/2018  . Pelvic pain 02/19/2018  . Chronic back pain 08/09/2017  . Primary osteoarthritis of right shoulder 09/20/2016  . Alopecia 01/10/2016  . Obesity 09/20/2015  . Preventative health care 09/08/2015  . Former smoker 09/08/2014  . HLD (hyperlipidemia) 09/01/2013  . Vitamin  D deficiency 07/03/2010  . Vaginal atrophy 06/30/2009  . Allergic rhinitis 03/01/2009   Past Medical History:  Diagnosis Date  . Allergic rhinitis   . Allergy   . Arthritis   . Arthritis of low back   . Back pain   . Cataract   . Cataracts, bilateral   . Constipation   . Dry skin    on hands  . Fibroid uterus   . Hyperlipidemia   . Joint pain   . Osteoarthritis of right shoulder   . Ringing  in ears   . Sciatica    sciatica  . Vitamin D deficiency     Family History  Problem Relation Age of Onset  . Colon cancer Sister   . Colon polyps Sister   . Diabetes Mother        father and 9 siblings  . Kidney disease Mother        renal failure  . Hypertension Mother   . Hyperlipidemia Mother   . Stroke Mother   . Depression Mother   . Anxiety disorder Mother   . Obesity Mother   . Heart disease Father        paternal grandmother  . Obesity Father   . Diabetes Father   . Hypertension Father   . Hyperlipidemia Father   . Eating disorder Father   . Esophageal cancer Neg Hx   . Rectal cancer Neg Hx   . Stomach cancer Neg Hx     Past Surgical History:  Procedure Laterality Date  . ABDOMINAL HYSTERECTOMY    . broken ankle    . CARPAL TUNNEL RELEASE Bilateral   . SHOULDER SURGERY Right    Social History   Occupational History  . Occupation: Retired [employer]  Tobacco Use  . Smoking status: Former Smoker    Packs/day: 1.00    Years: 29.00    Pack years: 29.00    Types: Cigarettes  . Smokeless tobacco: Never Used  . Tobacco comment: 30 years  Substance and Sexual Activity  . Alcohol use: No  . Drug use: No  . Sexual activity: Not on file

## 2020-05-28 ENCOUNTER — Other Ambulatory Visit: Payer: Self-pay

## 2020-05-28 ENCOUNTER — Ambulatory Visit (INDEPENDENT_AMBULATORY_CARE_PROVIDER_SITE_OTHER)

## 2020-05-28 DIAGNOSIS — Z23 Encounter for immunization: Secondary | ICD-10-CM

## 2020-06-24 ENCOUNTER — Other Ambulatory Visit: Payer: Self-pay | Admitting: Primary Care

## 2020-06-24 DIAGNOSIS — J309 Allergic rhinitis, unspecified: Secondary | ICD-10-CM

## 2020-06-24 LAB — HM MAMMOGRAPHY

## 2020-06-27 ENCOUNTER — Encounter: Payer: Self-pay | Admitting: Primary Care

## 2020-07-02 ENCOUNTER — Ambulatory Visit: Attending: Internal Medicine

## 2020-07-02 DIAGNOSIS — Z23 Encounter for immunization: Secondary | ICD-10-CM

## 2020-07-02 NOTE — Progress Notes (Signed)
   Covid-19 Vaccination Clinic  Name:  Jillian [name]    MRN: [id number] DOB: 1956-01-14  07/02/2020  Jillian [name] was observed post Covid-19 immunization for 15 minutes without incident. She was provided with Vaccine Information Sheet and instruction to access the V-Safe system.   Jillian [name] was instructed to call 911 with any severe reactions post vaccine: Marland Kitchen Difficulty breathing  . Swelling of face and throat  . A fast heartbeat  . A bad rash all over body  . Dizziness and weakness

## 2020-07-10 ENCOUNTER — Other Ambulatory Visit: Payer: Self-pay | Admitting: Primary Care

## 2020-07-10 DIAGNOSIS — E785 Hyperlipidemia, unspecified: Secondary | ICD-10-CM

## 2020-07-10 DIAGNOSIS — R7303 Prediabetes: Secondary | ICD-10-CM

## 2020-07-10 DIAGNOSIS — E559 Vitamin D deficiency, unspecified: Secondary | ICD-10-CM

## 2020-07-11 ENCOUNTER — Other Ambulatory Visit (INDEPENDENT_AMBULATORY_CARE_PROVIDER_SITE_OTHER)

## 2020-07-11 ENCOUNTER — Other Ambulatory Visit: Payer: Self-pay

## 2020-07-11 DIAGNOSIS — E785 Hyperlipidemia, unspecified: Secondary | ICD-10-CM

## 2020-07-11 DIAGNOSIS — E559 Vitamin D deficiency, unspecified: Secondary | ICD-10-CM | POA: Diagnosis not present

## 2020-07-11 DIAGNOSIS — R7303 Prediabetes: Secondary | ICD-10-CM

## 2020-07-11 LAB — COMPREHENSIVE METABOLIC PANEL
ALT: 27 U/L (ref 0–35)
AST: 26 U/L (ref 0–37)
Albumin: 4.2 g/dL (ref 3.5–5.2)
Alkaline Phosphatase: 69 U/L (ref 39–117)
BUN: 11 mg/dL (ref 6–23)
CO2: 28 mEq/L (ref 19–32)
Calcium: 9.3 mg/dL (ref 8.4–10.5)
Chloride: 105 mEq/L (ref 96–112)
Creatinine, Ser: 0.89 mg/dL (ref 0.40–1.20)
GFR: 68.45 mL/min (ref >60.00)
Glucose, Bld: 94 mg/dL (ref 70–99)
Potassium: 3.9 mEq/L (ref 3.5–5.1)
Sodium: 139 mEq/L (ref 135–145)
Total Bilirubin: 0.5 mg/dL (ref 0.2–1.2)
Total Protein: 7.6 g/dL (ref 6.0–8.3)

## 2020-07-11 LAB — CBC
HCT: 40.8 % (ref 36.0–46.0)
Hemoglobin: 13.5 g/dL (ref 12.0–15.0)
MCHC: 33.1 g/dL (ref 30.0–36.0)
MCV: 85.6 fl (ref 78.0–100.0)
Platelets: 257 10*3/uL (ref 150.0–400.0)
RBC: 4.76 Mil/uL (ref 3.87–5.11)
RDW: 13.9 % (ref 11.5–15.5)
WBC: 4.3 10*3/uL (ref 4.0–10.5)

## 2020-07-11 LAB — LIPID PANEL
Cholesterol: 178 mg/dL (ref 0–200)
HDL: 85.9 mg/dL (ref >39.00)
LDL Cholesterol: 83 mg/dL (ref 0–99)
NonHDL: 92.51
Total CHOL/HDL Ratio: 2
Triglycerides: 46 mg/dL (ref 0.0–149.0)
VLDL: 9.2 mg/dL (ref 0.0–40.0)

## 2020-07-11 LAB — HEMOGLOBIN A1C: Hgb A1c MFr Bld: 6 % (ref 4.6–6.5)

## 2020-07-11 LAB — VITAMIN D 25 HYDROXY (VIT D DEFICIENCY, FRACTURES): VITD: 46.51 ng/mL (ref 30.00–100.00)

## 2020-07-15 ENCOUNTER — Other Ambulatory Visit: Payer: Self-pay | Admitting: Primary Care

## 2020-07-15 DIAGNOSIS — E785 Hyperlipidemia, unspecified: Secondary | ICD-10-CM

## 2020-07-20 ENCOUNTER — Other Ambulatory Visit (HOSPITAL_COMMUNITY)
Admission: RE | Admit: 2020-07-20 | Discharge: 2020-07-20 | Disposition: A | Discharge status: Home / Self Care | Source: Ambulatory Visit | Attending: Primary Care | Admitting: Primary Care

## 2020-07-20 ENCOUNTER — Encounter: Payer: Self-pay | Admitting: Primary Care

## 2020-07-20 ENCOUNTER — Other Ambulatory Visit: Payer: Self-pay

## 2020-07-20 ENCOUNTER — Ambulatory Visit (INDEPENDENT_AMBULATORY_CARE_PROVIDER_SITE_OTHER): Admitting: Primary Care

## 2020-07-20 VITALS — BP 140/78 | HR 52 | Temp 97.6°F | Ht 65.5 in | Wt 228.0 lb

## 2020-07-20 DIAGNOSIS — L853 Xerosis cutis: Secondary | ICD-10-CM

## 2020-07-20 DIAGNOSIS — E559 Vitamin D deficiency, unspecified: Secondary | ICD-10-CM

## 2020-07-20 DIAGNOSIS — R238 Other skin changes: Secondary | ICD-10-CM

## 2020-07-20 DIAGNOSIS — Z124 Encounter for screening for malignant neoplasm of cervix: Secondary | ICD-10-CM | POA: Insufficient documentation

## 2020-07-20 DIAGNOSIS — J309 Allergic rhinitis, unspecified: Secondary | ICD-10-CM

## 2020-07-20 DIAGNOSIS — M545 Low back pain, unspecified: Secondary | ICD-10-CM | POA: Diagnosis not present

## 2020-07-20 DIAGNOSIS — E785 Hyperlipidemia, unspecified: Secondary | ICD-10-CM | POA: Diagnosis not present

## 2020-07-20 DIAGNOSIS — Z1151 Encounter for screening for human papillomavirus (HPV): Secondary | ICD-10-CM | POA: Diagnosis not present

## 2020-07-20 DIAGNOSIS — G8929 Other chronic pain: Secondary | ICD-10-CM

## 2020-07-20 DIAGNOSIS — R7303 Prediabetes: Secondary | ICD-10-CM

## 2020-07-20 DIAGNOSIS — F331 Major depressive disorder, recurrent, moderate: Secondary | ICD-10-CM

## 2020-07-20 DIAGNOSIS — R030 Elevated blood-pressure reading, without diagnosis of hypertension: Secondary | ICD-10-CM

## 2020-07-20 MED ORDER — FLUTICASONE PROPIONATE 50 MCG/ACT NA SUSP: 1.0000 | Freq: Every day | NASAL | 2 refills | Status: AC | PRN

## 2020-07-20 MED ORDER — FLUOCINONIDE EMULSIFIED BASE 0.05 % EX CREA: TOPICAL_CREAM | CUTANEOUS | 0 refills | Status: AC

## 2020-07-20 MED ORDER — BUPROPION HCL ER (SR) 200 MG PO TB12: ORAL_TABLET | ORAL | 3 refills | Status: DC

## 2020-07-20 MED ORDER — METFORMIN HCL 500 MG PO TABS: 500.0000 mg | ORAL_TABLET | Freq: Every day | ORAL | 3 refills | Status: DC

## 2020-07-20 MED ORDER — ATORVASTATIN CALCIUM 20 MG PO TABS: 20.0000 mg | ORAL_TABLET | Freq: Every evening | ORAL | 3 refills | Status: DC

## 2020-07-20 NOTE — Assessment & Plan Note (Signed)
Recent A1C of 6.0 which is an increase from last year. Compliant to metformin, admits to unhealthy diet.   She will work on diet. Repeat A1C in 6 months.

## 2020-07-20 NOTE — Progress Notes (Signed)
Subjective:    Patient ID: Jillian [name], female    DOB: 23-Jun-1956, 64 y.o.   MRN: [id number]  HPI  This visit occurred during the SARS-CoV-2 public health emergency.  Safety protocols were in place, including screening questions prior to the visit, additional usage of staff PPE, and extensive cleaning of exam room while observing appropriate contact time as indicated for disinfecting solutions.   Jillian [name] is a 64 year old female who presents today for follow up with pap smear.   Immunizations: -Tetanus: Completed in 2020 -Influenza: Completed in 2021 -Shingles: Completed in 2020 -Pneumonia: Due next year. -Covid-19: Completed series in 2021  Diet: She endorses a fair diet.  Exercise: No regular exercise.  Eye exam: Due December 2021 Dental exam: Completes annually   Pap Smear: Completed in 2018, due today Mammogram: Completed in 2021 Colonoscopy: Completed in 2019, due in 2024  BP Readings from Last 3 Encounters:  07/20/20 140/78  08/12/19 130/74  07/08/19 134/78     She is not checking her BP at home as readings were historically "normal".   Review of Systems  Eyes: Negative for visual disturbance.  Respiratory: Negative for shortness of breath.   Cardiovascular: Negative for chest pain.  Allergic/Immunologic: Positive for environmental allergies.  Neurological: Negative for dizziness and headaches.  Psychiatric/Behavioral:       Doing well on bupropion. Increased stress recently with family and personal life.       Past Medical History:  Diagnosis Date  . Allergic rhinitis   . Allergy   . Arthritis   . Arthritis of low back   . Back pain   . Cataract   . Cataracts, bilateral   . Constipation   . Dry skin    on hands  . Fibroid uterus   . Hyperlipidemia   . Joint pain   . Osteoarthritis of right shoulder   . Ringing in ears   . Sciatica    sciatica  . Vitamin D deficiency      Social History   Socioeconomic History  .  Marital status: Married    Spouse name: Jillian [name]  . Number of children: 0  . Years of education: Not on file  . Highest education level: Not on file  Occupational History  . Occupation: Retired [employer]  Tobacco Use  . Smoking status: Former Smoker    Packs/day: 1.00    Years: 29.00    Pack years: 29.00    Types: Cigarettes  . Smokeless tobacco: Never Used  . Tobacco comment: 30 years  Substance and Sexual Activity  . Alcohol use: No  . Drug use: No  . Sexual activity: Not on file  Other Topics Concern  . Not on file  Social History Narrative   ** Merged History Encounter **       Social Determinants of Health   Financial Resource Strain:   . Difficulty of Paying Living Expenses: Not on file  Food Insecurity:   . Worried About Charity fundraiser in the Last Year: Not on file  . Ran Out of Food in the Last Year: Not on file  Transportation Needs:   . Lack of Transportation (Medical): Not on file  . Lack of Transportation (Non-Medical): Not on file  Physical Activity:   . Days of Exercise per Week: Not on file  . Minutes of Exercise per Session: Not on file  Stress:   . Feeling of Stress : Not on file  Social  Connections:   . Frequency of Communication with Friends and Family: Not on file  . Frequency of Social Gatherings with Friends and Family: Not on file  . Attends Religious Services: Not on file  . Active Member of Clubs or Organizations: Not on file  . Attends Archivist Meetings: Not on file  . Marital Status: Not on file  Intimate Partner Violence:   . Fear of Current or Ex-Partner: Not on file  . Emotionally Abused: Not on file  . Physically Abused: Not on file  . Sexually Abused: Not on file    Past Surgical History:  Procedure Laterality Date  . ABDOMINAL HYSTERECTOMY    . broken ankle    . CARPAL TUNNEL RELEASE Bilateral   . SHOULDER SURGERY Right     Family History  Problem Relation Age of Onset  . Colon cancer  Sister   . Colon polyps Sister   . Diabetes Mother        father and 9 siblings  . Kidney disease Mother        renal failure  . Hypertension Mother   . Hyperlipidemia Mother   . Stroke Mother   . Depression Mother   . Anxiety disorder Mother   . Obesity Mother   . Heart disease Father        paternal grandmother  . Obesity Father   . Diabetes Father   . Hypertension Father   . Hyperlipidemia Father   . Eating disorder Father   . Esophageal cancer Neg Hx   . Rectal cancer Neg Hx   . Stomach cancer Neg Hx     No Known Allergies  Current Outpatient Medications on File Prior to Visit  Medication Sig Dispense Refill  . Ascorbic Acid (VITAMIN C) 1000 MG tablet Take 1,000 mg by mouth daily.    Marland Kitchen aspirin EC 81 MG tablet Take 81 mg by mouth daily.    Marland Kitchen CALCIUM-MAGNESIUM-ZINC PO Take 1 capsule by mouth daily.    . cetirizine (ZYRTEC) 10 MG tablet TAKE 1 TABLET BY MOUTH EVERY DAY 90 tablet 1  . Coenzyme Q10 (CO Q 10) 100 MG CAPS Take 1 capsule by mouth daily.    Marland Kitchen GARLIC PO Take 716 mg by mouth daily.    Marland Kitchen ibuprofen (ADVIL,MOTRIN) 800 MG tablet Take 1 tablet (800 mg total) by mouth every 8 (eight) hours as needed. 30 tablet 0  . meloxicam (MOBIC) 15 MG tablet Take 1 tablet (15 mg total) by mouth daily. 30 tablet 1  . methocarbamol (ROBAXIN) 500 MG tablet 1 po q 8 hrs prn 30 tablet 0  . Multiple Vitamins-Minerals (ALIVE ONCE DAILY WOMENS 50+) TABS Take 1 tablet by mouth daily.    . Omega-3 1000 MG CAPS Take 1 capsule by mouth daily.    . Probiotic Product (PROBIOTIC-10) CAPS Take 1 capsule by mouth daily.    . Turmeric Curcumin 500 MG CAPS Take 1 capsule by mouth daily.    . vitamin E 400 UNIT capsule Take 400 Units by mouth daily.     No current facility-administered medications on file prior to visit.    BP (!) 148/88   Pulse (!) 52   Temp 97.6 F (36.4 C) (Temporal)   Ht 5' 5.5" (1.664 m)   Wt 228 lb (103.4 kg)   SpO2 92%   BMI 37.36 kg/m    Objective:   Physical  Exam Cardiovascular:     Rate and Rhythm: Normal rate and regular rhythm.  Pulmonary:     Effort: Pulmonary effort is normal.     Breath sounds: Normal breath sounds.  Genitourinary:    Labia:        Right: No tenderness or lesion.        Left: No tenderness or lesion.      Vagina: Normal.     Cervix: No cervical motion tenderness or discharge.     Adnexa: Right adnexa normal and left adnexa normal.  Musculoskeletal:     Cervical back: Neck supple.  Skin:    General: Skin is warm and dry.            Assessment & Plan:

## 2020-07-20 NOTE — Patient Instructions (Addendum)
Start exercising. You should be getting 150 minutes of moderate intensity exercise weekly.  It's important to improve your diet by reducing consumption of fast food, fried food, processed snack foods, sugary drinks. Increase consumption of fresh vegetables and fruits, whole grains, water.  Ensure you are drinking 64 ounces of water daily.  Monitor your blood pressure at home, notify me if you see readings consistently at or above 135/90.  It was a pleasure to see you today!

## 2020-07-20 NOTE — Assessment & Plan Note (Signed)
Recent lipid panel stable, continue atorvastatin 20 mg. Refills sent to pharmacy.

## 2020-07-20 NOTE — Assessment & Plan Note (Signed)
Doing well on bupropion SR 200 mg daily,continue same.

## 2020-07-20 NOTE — Assessment & Plan Note (Signed)
Doing well on 4000 IU vitamin D, recent level stable. Continue same.

## 2020-07-20 NOTE — Assessment & Plan Note (Signed)
Overall stable, doing well on Zyrtec and Flonase PRN. Continue same.

## 2020-07-20 NOTE — Assessment & Plan Note (Signed)
Chronic, overall stable.  Doing well on PRN Meloxicam and Robaxin, continue same. Follows with Dr. Marlou Sa.

## 2020-07-20 NOTE — Assessment & Plan Note (Signed)
Above goal today, even on recheck. Discussed to work on weight loss through diet and exercise.  Will also have her monitor BP and update if readings are consistently at or above 135/90.

## 2020-07-20 NOTE — Assessment & Plan Note (Signed)
Doing well on PRN fluocinonide, refill sent to pharmacy.

## 2020-07-22 LAB — CYTOLOGY - PAP
Comment: NEGATIVE
Diagnosis: NEGATIVE
High risk HPV: NEGATIVE

## 2020-07-28 ENCOUNTER — Encounter: Payer: Self-pay | Admitting: Primary Care

## 2020-09-06 ENCOUNTER — Other Ambulatory Visit: Payer: Self-pay | Admitting: Surgical

## 2020-09-06 NOTE — Telephone Encounter (Signed)
Please advise 

## 2020-09-14 NOTE — Telephone Encounter (Signed)
Sent in refill, okay for this, just dont take Mobic (Meloxicam) with Ibuprofen

## 2020-10-12 LAB — HM DIABETES EYE EXAM

## 2020-10-13 ENCOUNTER — Encounter: Payer: Self-pay | Admitting: Primary Care

## 2020-10-27 ENCOUNTER — Telehealth: Payer: Self-pay

## 2020-10-27 NOTE — Telephone Encounter (Signed)
Patient has not seen Dr. Ernestina Patches for more than 5 years. I advised that she can follow up with Dr. Marlou Sa for a possible referral or I can schedule an office visit with Dr. Ernestina Patches to discuss a possible injection. She stated that she will reach out to Dr. Marlou Sa to discuss.

## 2020-10-27 NOTE — Telephone Encounter (Signed)
Patient called she is requesting a appointment with Dr.Newton for a epidural injection call back:[phone]

## 2020-10-28 ENCOUNTER — Other Ambulatory Visit: Payer: Self-pay

## 2020-10-28 ENCOUNTER — Ambulatory Visit (INDEPENDENT_AMBULATORY_CARE_PROVIDER_SITE_OTHER): Admitting: Surgical

## 2020-10-28 ENCOUNTER — Encounter: Payer: Self-pay | Admitting: Surgical

## 2020-10-28 ENCOUNTER — Ambulatory Visit (HOSPITAL_COMMUNITY)
Admission: RE | Admit: 2020-10-28 | Discharge: 2020-10-28 | Disposition: A | Discharge status: Home / Self Care | Source: Ambulatory Visit | Attending: Surgical | Admitting: Surgical

## 2020-10-28 DIAGNOSIS — M5416 Radiculopathy, lumbar region: Secondary | ICD-10-CM | POA: Diagnosis not present

## 2020-10-28 DIAGNOSIS — R2241 Localized swelling, mass and lump, right lower limb: Secondary | ICD-10-CM | POA: Insufficient documentation

## 2020-10-28 MED ORDER — GABAPENTIN 100 MG PO CAPS: 100.0000 mg | ORAL_CAPSULE | Freq: Three times a day (TID) | ORAL | 0 refills | Status: DC

## 2020-10-29 ENCOUNTER — Encounter: Payer: Self-pay | Admitting: Surgical

## 2020-10-29 NOTE — Progress Notes (Unsigned)
Office Visit Note   Patient: Jillian [name]           Date of Birth: [birth year]           MRN: [id number] Visit Date: 10/28/2020 Requested by: Pleas Koch, NP [place],  [place] [id number] PCP: Pleas Koch, NP  Subjective: Chief Complaint  Patient presents with  . Lower Back - Pain    HPI: Jillian [name] is a 65 y.o. female who presents to the office complaining of low back pain with right and left leg pain.  Patient states that about 10 days ago she was picking up sticks for about an hour outside and began to notice increased low back pain with radicular pain down her right and left legs.  She denies feeling any injury or significant straining at the time.  She reports radicular pain down her left leg from her buttocks down the posterior aspect of the leg into the ankle.  She also has posterior and medial pain down the right lower extremity from the buttocks into the calf.  She has a sensitivity particularly behind the medial aspect of her right knee.  She has associated numbness and tingling.  This pain does not wake her up from sleep at night.  She has not made any significant improvements in the last 10 days.  She has been trying her home exercise program that was taught to her at her formal physical therapy sessions in 2021 but these have not yielded any relief.  Meloxicam and muscle relaxers have not helped significantly either.  She does report that her right elbow pain from 2021 has resolved..                ROS: All systems reviewed are negative as they relate to the chief complaint within the history of present illness.  Patient denies fevers or chills.  Assessment & Plan: Visit Diagnoses:  1. Right lumbar radiculopathy   2. Localized swelling of right lower extremity     Plan: Patient is a 65 year old female who presents complaining of low back pain with bilateral leg radicular pain.  She has history of similar symptoms last year that  epidural steroid injections were considered for but ultimately postponed due to patient's improvement with formal physical therapy and home exercise program.  However, today her symptoms have been ongoing for almost 2 weeks with no improvement whatsoever with home exercise program and anti-inflammatory medications.  She does have MRI scan from April 2021 that showed mild spinal canal stenosis at L4-L5 due to intermediate disc bulge with left L3 neuroforaminal stenosis.  With persistence of symptoms, plan to refer patient to Dr. Laurence Spates for lumbar spine epidural steroid injections.  Prescribe gabapentin for 10 days to hopefully help with some of her numbness and tingling and radicular pain in the meantime.  Also ordered ultrasound of the right lower extremity to rule out a DVT given her increased tenderness in the calf that is asymptomatic.  She would like to definitively rule out a blood clot.  Turned out to be negative for DVT.  Follow-Up Instructions: No follow-ups on file.   Orders:  Orders Placed This Encounter  Procedures  . Ambulatory referral to Physical Medicine Rehab  . VAS [place] LOWER EXTREMITY VENOUS (DVT)   Meds ordered this encounter  Medications  . gabapentin (NEURONTIN) 100 MG capsule    Sig: Take 1 capsule (100 mg total) by mouth 3 (three) times daily.  Dispense:  30 capsule    Refill:  0      Procedures: No procedures performed   Clinical Data: No additional findings.  Objective: Vital Signs: There were no vitals taken for this visit.  Physical Exam:  Constitutional: Patient appears well-developed HEENT:  Head: Normocephalic Eyes:EOM are normal Neck: Normal range of motion Cardiovascular: Normal rate Pulmonary/chest: Effort normal Neurologic: Patient is alert Skin: Skin is warm Psychiatric: Patient has normal mood and affect  Ortho Exam: Ortho exam demonstrates mild tenderness throughout the axial lumbar spine.  No weakness of the bilateral legs with 5/5  motor strength of bilateral hip flexor, quadricep, hamstring, dorsiflexion, plantarflexion.  Sensation intact all dermatomes of the bilateral lower extremities aside from some dulled sensation of the medial right calf.  No pain with hip range of motion bilaterally.  No clonus bilaterally.  Specialty Comments:  No specialty comments available.  Imaging: VAS [place] LOWER EXTREMITY VENOUS (DVT)  Result Date: 10/28/2020  Lower Venous DVT Study Indications: Pain. Other Indications: Patient complains of one week of sciatic nerve pain in the                    posterior thigh which migrated to the posterior calf in the                    last 2-3 days. No associated edema or redness. Performing Technologist: Delorise Shiner RVT  Examination Guidelines: A complete evaluation includes B-mode imaging, spectral Doppler, color Doppler, and power Doppler as needed of all accessible portions of each vessel. Bilateral testing is considered an integral part of a complete examination. Limited examinations for reoccurring indications may be performed as noted. The reflux portion of the exam is performed with the patient in reverse Trendelenburg.  +---------+---------------+---------+-----------+----------+--------------+ RIGHT    CompressibilityPhasicitySpontaneityPropertiesThrombus Aging +---------+---------------+---------+-----------+----------+--------------+ CFV      Full           Yes      Yes                                 +---------+---------------+---------+-----------+----------+--------------+ SFJ      Full           Yes      Yes                                 +---------+---------------+---------+-----------+----------+--------------+ FV Prox  Full           Yes      Yes                                 +---------+---------------+---------+-----------+----------+--------------+ FV Mid   Full           Yes      Yes                                  +---------+---------------+---------+-----------+----------+--------------+ FV DistalFull           Yes      Yes                                 +---------+---------------+---------+-----------+----------+--------------+ PFV      Full  Yes      Yes                                 +---------+---------------+---------+-----------+----------+--------------+ POP      Full           Yes      Yes                                 +---------+---------------+---------+-----------+----------+--------------+ PTV      Full                                                        +---------+---------------+---------+-----------+----------+--------------+ PERO     Full                                                        +---------+---------------+---------+-----------+----------+--------------+ GSV      Full                    Yes                                 +---------+---------------+---------+-----------+----------+--------------+ SSV      Full                                                        +---------+---------------+---------+-----------+----------+--------------+   +----+---------------+---------+-----------+----------+--------------+ LEFTCompressibilityPhasicitySpontaneityPropertiesThrombus Aging +----+---------------+---------+-----------+----------+--------------+ CFV Full           Yes      Yes                                 +----+---------------+---------+-----------+----------+--------------+    Findings reported to Dr. Gloriann Loan at 15:23.  Summary: RIGHT: - There is no evidence of deep vein thrombosis in the lower extremity. - There is no evidence of superficial venous thrombosis.  - No cystic structure found in the popliteal fossa.  LEFT: - No evidence of deep vein thrombosis in the lower extremity. No indirect evidence of obstruction proximal to the inguinal ligament.  *See table(s) above for measurements and observations.  Electronically signed by Servando Snare MD on 10/28/2020 at 4:48:31 PM.    Final      PMFS History: Patient Active Problem List   Diagnosis Date Noted  . Elevated blood pressure reading in office without diagnosis of hypertension 07/20/2020  . Prediabetes 02/05/2019  . Depression 02/05/2019  . Dry skin 06/02/2018  . Pelvic pain 02/19/2018  . Chronic back pain 08/09/2017  . Primary osteoarthritis of right shoulder 09/20/2016  . Alopecia 01/10/2016  . Obesity 09/20/2015  . Preventative health care 09/08/2015  . Former smoker 09/08/2014  . HLD (hyperlipidemia) 09/01/2013  . Vitamin D deficiency 07/03/2010  . Vaginal atrophy 06/30/2009  . Allergic rhinitis 03/01/2009   Past  Medical History:  Diagnosis Date  . Allergic rhinitis   . Allergy   . Arthritis   . Arthritis of low back   . Back pain   . Cataract   . Cataracts, bilateral   . Constipation   . Dry skin    on hands  . Fibroid uterus   . Hyperlipidemia   . Joint pain   . Osteoarthritis of right shoulder   . Ringing in ears   . Sciatica    sciatica  . Vitamin D deficiency     Family History  Problem Relation Age of Onset  . Colon cancer Sister   . Colon polyps Sister   . Diabetes Mother        father and 9 siblings  . Kidney disease Mother        renal failure  . Hypertension Mother   . Hyperlipidemia Mother   . Stroke Mother   . Depression Mother   . Anxiety disorder Mother   . Obesity Mother   . Heart disease Father        paternal grandmother  . Obesity Father   . Diabetes Father   . Hypertension Father   . Hyperlipidemia Father   . Eating disorder Father   . Esophageal cancer Neg Hx   . Rectal cancer Neg Hx   . Stomach cancer Neg Hx     Past Surgical History:  Procedure Laterality Date  . ABDOMINAL HYSTERECTOMY    . broken ankle    . CARPAL TUNNEL RELEASE Bilateral   . SHOULDER SURGERY Right    Social History   Occupational History  . Occupation: Retired [employer]  Tobacco Use   . Smoking status: Former Smoker    Packs/day: 1.00    Years: 29.00    Pack years: 29.00    Types: Cigarettes  . Smokeless tobacco: Never Used  . Tobacco comment: 30 years  Substance and Sexual Activity  . Alcohol use: No  . Drug use: No  . Sexual activity: Not on file

## 2020-11-01 ENCOUNTER — Telehealth: Payer: Self-pay | Admitting: Physical Medicine and Rehabilitation

## 2020-11-01 NOTE — Telephone Encounter (Signed)
Patient called. She would like to schedule with Dr. Newton. 

## 2020-11-01 NOTE — Telephone Encounter (Signed)
Called pt and held appt for 4/4

## 2020-11-08 DIAGNOSIS — H2513 Age-related nuclear cataract, bilateral: Secondary | ICD-10-CM

## 2020-11-08 HISTORY — DX: Age-related nuclear cataract, bilateral: H25.13

## 2020-11-21 ENCOUNTER — Ambulatory Visit: Payer: Self-pay

## 2020-11-21 ENCOUNTER — Encounter: Payer: Self-pay | Admitting: Physical Medicine and Rehabilitation

## 2020-11-21 ENCOUNTER — Other Ambulatory Visit: Payer: Self-pay

## 2020-11-21 ENCOUNTER — Ambulatory Visit (INDEPENDENT_AMBULATORY_CARE_PROVIDER_SITE_OTHER): Admitting: Physical Medicine and Rehabilitation

## 2020-11-21 VITALS — BP 147/91 | HR 85

## 2020-11-21 DIAGNOSIS — M5416 Radiculopathy, lumbar region: Secondary | ICD-10-CM

## 2020-11-21 MED ORDER — BETAMETHASONE SOD PHOS & ACET 6 (3-3) MG/ML IJ SUSP
12.0000 mg | Freq: Once | INTRAMUSCULAR | Status: AC
  Administered 2020-11-21: 12 mg

## 2020-11-21 NOTE — Patient Instructions (Signed)

## 2020-11-21 NOTE — Progress Notes (Signed)
Jillian [name] - 65 y.o. female MRN [id number]  Date of birth: 07-Jan-1956  Office Visit Note: Visit Date: 11/21/2020 PCP: Pleas Koch, NP Referred by: Pleas Koch, NP  Subjective: Chief Complaint  Patient presents with  . Lower Back - Pain  . Left Leg - Pain  . Right Leg - Pain  . Right Foot - Pain  . Left Foot - Pain   HPI:  ZSOFIA [name] is a 64 y.o. female who comes in today at the request of Dr. Anderson Malta for planned Right L4-L5 Lumbar epidural steroid injection with fluoroscopic guidance.  The patient has failed conservative care including home exercise, medications, time and activity modification.  This injection will be diagnostic and hopefully therapeutic.  Please see requesting physician notes for further details and justification. MRI reviewed with images and spine model.  MRI reviewed in the note below.  I do remember the patient and I have seen her quite some time ago for epidural injection that did help back then.    ROS Otherwise per HPI.  Assessment & Plan: Visit Diagnoses:    ICD-10-CM   1. Lumbar radiculopathy  M54.16 XR C-ARM NO REPORT    Epidural Steroid injection    betamethasone acetate-betamethasone sodium phosphate (CELESTONE) injection 12 mg    Plan: No additional findings.   Meds & Orders:  Meds ordered this encounter  Medications  . betamethasone acetate-betamethasone sodium phosphate (CELESTONE) injection 12 mg    Orders Placed This Encounter  Procedures  . XR C-ARM NO REPORT  . Epidural Steroid injection    Follow-up: Return if symptoms worsen or fail to improve.   Procedures: No procedures performed  Lumbar Epidural Steroid Injection - Interlaminar Approach with Fluoroscopic Guidance  Patient: Jillian [name]      Date of Birth: 13-Dec-1955 MRN: [id number] PCP: Pleas Koch, NP      Visit Date: 11/21/2020   Universal Protocol:     Consent Given By: the patient  Position:  PRONE  Additional Comments: Vital signs were monitored before and after the procedure. Patient was prepped and draped in the usual sterile fashion. The correct patient, procedure, and site was verified.   Injection Procedure Details:   Procedure diagnoses: Lumbar radiculopathy [M54.16]   Meds Administered:  Meds ordered this encounter  Medications  . betamethasone acetate-betamethasone sodium phosphate (CELESTONE) injection 12 mg     Laterality: Right  Location/Site:  L4-L5  Needle: 3.5 in., 20 ga. Tuohy  Needle Placement: Paramedian epidural  Findings:   -Comments: Excellent flow of contrast into the epidural space.  Procedure Details: Using a paramedian approach from the side mentioned above, the region overlying the inferior lamina was localized under fluoroscopic visualization and the soft tissues overlying this structure were infiltrated with 4 ml. of 1% Lidocaine without Epinephrine. The Tuohy needle was inserted into the epidural space using a paramedian approach.   The epidural space was localized using loss of resistance along with counter oblique bi-planar fluoroscopic views.  After negative aspirate for air, blood, and CSF, a 2 ml. volume of Isovue-250 was injected into the epidural space and the flow of contrast was observed. Radiographs were obtained for documentation purposes.    The injectate was administered into the level noted above.   Additional Comments:  The patient tolerated the procedure well Dressing: 2 x 2 sterile gauze and Band-Aid    Post-procedure details: Patient was observed during the procedure. Post-procedure instructions were reviewed.  Patient left the  clinic in stable condition.     Clinical History: MRI LUMBAR SPINE WITHOUT CONTRAST  TECHNIQUE: Multiplanar, multisequence MR imaging of the lumbar spine was performed. No intravenous contrast was administered.  COMPARISON:  None.  FINDINGS: Segmentation:   Standard.  Alignment:  Physiologic.  Vertebrae:  No fracture, evidence of discitis, or bone lesion.  Conus medullaris and cauda equina: Conus extends to the L2 level. Conus and cauda equina appear normal.  Paraspinal and other soft tissues: Negative  Disc levels:  T12-L1: Normal disc space and facet joints. There is no spinal canal stenosis. No neural foraminal stenosis.  L1-L2: Normal disc space and facet joints. There is no spinal canal stenosis. No neural foraminal stenosis.  L2-L3: Normal disc space and facet joints. There is no spinal canal stenosis. No neural foraminal stenosis.  L3-L4: Mild disc bulge. There is no spinal canal stenosis. Mild left neural foraminal stenosis.  L4-L5: Intermediate sized diffuse disc bulge. Mild spinal canal stenosis. No neural foraminal stenosis.  L5-S1: Normal disc space and facet joints. There is no spinal canal stenosis. No neural foraminal stenosis.  Visualized sacrum: Normal.  IMPRESSION: 1. L4-5 mild spinal canal stenosis due to intermediate sized disc bulge. Impingement of the descending L5 nerve roots in the lateral recesses could cause corresponding radiculopathy. 2. Mild left L3 neural foraminal stenosis.   Electronically Signed   By: Ulyses Jarred M.D.   On: 11/22/2019 03:49     Objective:  VS:  HT:    WT:   BMI:     BP:(!) 147/91  HR:85bpm  TEMP: ( )  RESP:  Physical Exam Vitals and nursing note reviewed.  Constitutional:      General: She is not in acute distress.    Appearance: Normal appearance. She is obese. She is not ill-appearing.  HENT:     Head: Normocephalic and atraumatic.     Right Ear: External ear normal.     Left Ear: External ear normal.  Eyes:     Extraocular Movements: Extraocular movements intact.  Cardiovascular:     Rate and Rhythm: Normal rate.     Pulses: Normal pulses.  Pulmonary:     Effort: Pulmonary effort is normal. No respiratory distress.  Abdominal:      General: There is no distension.     Palpations: Abdomen is soft.  Musculoskeletal:        General: Tenderness present.     Cervical back: Neck supple.     Right lower leg: No edema.     Left lower leg: No edema.     Comments: Patient has good distal strength with no pain over the greater trochanters.  No clonus or focal weakness.  Skin:    Findings: No erythema, lesion or rash.  Neurological:     General: No focal deficit present.     Mental Status: She is alert and oriented to person, place, and time.     Sensory: No sensory deficit.     Motor: No weakness or abnormal muscle tone.     Coordination: Coordination normal.  Psychiatric:        Mood and Affect: Mood normal.        Behavior: Behavior normal.      Imaging: XR C-ARM NO REPORT  Result Date: 11/21/2020 Please see Notes tab for imaging impression.

## 2020-11-21 NOTE — Procedures (Signed)
Lumbar Epidural Steroid Injection - Interlaminar Approach with Fluoroscopic Guidance  Patient: Jillian [name]      Date of Birth: [birth year] MRN: [id number] PCP: Pleas Koch, NP      Visit Date: 11/21/2020   Universal Protocol:     Consent Given By: the patient  Position: PRONE  Additional Comments: Vital signs were monitored before and after the procedure. Patient was prepped and draped in the usual sterile fashion. The correct patient, procedure, and site was verified.   Injection Procedure Details:   Procedure diagnoses: Lumbar radiculopathy [M54.16]   Meds Administered:  Meds ordered this encounter  Medications  . betamethasone acetate-betamethasone sodium phosphate (CELESTONE) injection 12 mg     Laterality: Right  Location/Site:  L4-L5  Needle: 3.5 in., 20 ga. Tuohy  Needle Placement: Paramedian epidural  Findings:   -Comments: Excellent flow of contrast into the epidural space.  Procedure Details: Using a paramedian approach from the side mentioned above, the region overlying the inferior lamina was localized under fluoroscopic visualization and the soft tissues overlying this structure were infiltrated with 4 ml. of 1% Lidocaine without Epinephrine. The Tuohy needle was inserted into the epidural space using a paramedian approach.   The epidural space was localized using loss of resistance along with counter oblique bi-planar fluoroscopic views.  After negative aspirate for air, blood, and CSF, a 2 ml. volume of Isovue-250 was injected into the epidural space and the flow of contrast was observed. Radiographs were obtained for documentation purposes.    The injectate was administered into the level noted above.   Additional Comments:  The patient tolerated the procedure well Dressing: 2 x 2 sterile gauze and Band-Aid    Post-procedure details: Patient was observed during the procedure. Post-procedure instructions were reviewed.  Patient  left the clinic in stable condition.

## 2020-11-21 NOTE — Progress Notes (Signed)
Pt state lower back pain that travels down both legs, to her groin area then to her feet. Pt state sitting makes the pain worse. Pt state she take pain meds and use pillow in between her legs to help ease the pain.  Numeric Pain Rating Scale and Functional Assessment Average Pain 6   In the last MONTH (on 0-10 scale) has pain interfered with the following?  1. General activity like being  able to carry out your everyday physical activities such as walking, climbing stairs, carrying groceries, or moving a chair?  Rating(6)   +Driver, -BT, -Dye Allergies.

## 2020-12-22 ENCOUNTER — Encounter: Payer: Self-pay | Admitting: Physical Medicine and Rehabilitation

## 2020-12-24 ENCOUNTER — Other Ambulatory Visit: Payer: Self-pay | Admitting: Primary Care

## 2020-12-24 DIAGNOSIS — J309 Allergic rhinitis, unspecified: Secondary | ICD-10-CM

## 2020-12-28 ENCOUNTER — Ambulatory Visit (INDEPENDENT_AMBULATORY_CARE_PROVIDER_SITE_OTHER): Payer: Medicare Other | Admitting: Family Medicine

## 2020-12-28 ENCOUNTER — Encounter (INDEPENDENT_AMBULATORY_CARE_PROVIDER_SITE_OTHER): Payer: Self-pay | Admitting: Family Medicine

## 2020-12-28 ENCOUNTER — Other Ambulatory Visit: Payer: Self-pay

## 2020-12-28 VITALS — BP 142/85 | HR 77 | Temp 98.1°F | Ht 66.0 in | Wt 223.0 lb

## 2020-12-28 DIAGNOSIS — R0602 Shortness of breath: Secondary | ICD-10-CM

## 2020-12-28 DIAGNOSIS — Z0289 Encounter for other administrative examinations: Secondary | ICD-10-CM

## 2020-12-28 DIAGNOSIS — Z1331 Encounter for screening for depression: Secondary | ICD-10-CM

## 2020-12-28 DIAGNOSIS — R5383 Other fatigue: Secondary | ICD-10-CM | POA: Diagnosis not present

## 2020-12-28 DIAGNOSIS — E669 Obesity, unspecified: Secondary | ICD-10-CM

## 2020-12-28 DIAGNOSIS — E559 Vitamin D deficiency, unspecified: Secondary | ICD-10-CM | POA: Diagnosis not present

## 2020-12-28 DIAGNOSIS — Z6834 Body mass index (BMI) 34.0-34.9, adult: Secondary | ICD-10-CM

## 2020-12-28 DIAGNOSIS — R7303 Prediabetes: Secondary | ICD-10-CM | POA: Diagnosis not present

## 2020-12-28 DIAGNOSIS — E7849 Other hyperlipidemia: Secondary | ICD-10-CM

## 2020-12-29 LAB — CBC WITH DIFFERENTIAL
Basophils Absolute: 0 10*3/uL (ref 0.0–0.2)
Basos: 1 %
EOS (ABSOLUTE): 0.2 10*3/uL (ref 0.0–0.4)
Eos: 4 %
Hematocrit: 44.2 % (ref 34.0–46.6)
Hemoglobin: 13.8 g/dL (ref 11.1–15.9)
Immature Grans (Abs): 0 10*3/uL (ref 0.0–0.1)
Immature Granulocytes: 0 %
Lymphocytes Absolute: 1.8 10*3/uL (ref 0.7–3.1)
Lymphs: 37 %
MCH: 27.6 pg (ref 26.6–33.0)
MCHC: 31.2 g/dL — ABNORMAL LOW (ref 31.5–35.7)
MCV: 88 fL (ref 79–97)
Monocytes Absolute: 0.4 10*3/uL (ref 0.1–0.9)
Monocytes: 8 %
Neutrophils Absolute: 2.4 10*3/uL (ref 1.4–7.0)
Neutrophils: 50 %
RBC: 5 x10E6/uL (ref 3.77–5.28)
RDW: 13 % (ref 11.7–15.4)
WBC: 4.9 10*3/uL (ref 3.4–10.8)

## 2020-12-29 LAB — T4: T4, Total: 6.6 ug/dL (ref 4.5–12.0)

## 2020-12-29 LAB — HEMOGLOBIN A1C
Est. average glucose Bld gHb Est-mCnc: 123 mg/dL
Hgb A1c MFr Bld: 5.9 % — ABNORMAL HIGH (ref 4.8–5.6)

## 2020-12-29 LAB — COMPREHENSIVE METABOLIC PANEL
ALT: 23 IU/L (ref 0–32)
AST: 23 IU/L (ref 0–40)
Albumin/Globulin Ratio: 1.6 (ref 1.2–2.2)
Albumin: 4.6 g/dL (ref 3.8–4.8)
Alkaline Phosphatase: 85 IU/L (ref 44–121)
BUN/Creatinine Ratio: 18 (ref 12–28)
BUN: 14 mg/dL (ref 8–27)
Bilirubin Total: 0.4 mg/dL (ref 0.0–1.2)
CO2: 22 mmol/L (ref 20–29)
Calcium: 9.3 mg/dL (ref 8.7–10.3)
Chloride: 104 mmol/L (ref 96–106)
Creatinine, Ser: 0.8 mg/dL (ref 0.57–1.00)
Globulin, Total: 2.9 g/dL (ref 1.5–4.5)
Glucose: 95 mg/dL (ref 65–99)
Potassium: 4.9 mmol/L (ref 3.5–5.2)
Sodium: 143 mmol/L (ref 134–144)
Total Protein: 7.5 g/dL (ref 6.0–8.5)
eGFR: 82 mL/min/{1.73_m2} (ref >59)

## 2020-12-29 LAB — INSULIN, RANDOM: INSULIN: 11.3 u[IU]/mL (ref 2.6–24.9)

## 2020-12-29 LAB — LIPID PANEL WITH LDL/HDL RATIO
Cholesterol, Total: 198 mg/dL (ref 100–199)
HDL: 85 mg/dL (ref >39)
LDL Chol Calc (NIH): 103 mg/dL — ABNORMAL HIGH (ref 0–99)
LDL/HDL Ratio: 1.2 ratio (ref 0.0–3.2)
Triglycerides: 53 mg/dL (ref 0–149)
VLDL Cholesterol Cal: 10 mg/dL (ref 5–40)

## 2020-12-29 LAB — T3: T3, Total: 114 ng/dL (ref 71–180)

## 2020-12-29 LAB — VITAMIN D 25 HYDROXY (VIT D DEFICIENCY, FRACTURES): Vit D, 25-Hydroxy: 54.9 ng/mL (ref 30.0–100.0)

## 2020-12-29 LAB — TSH: TSH: 2 u[IU]/mL (ref 0.450–4.500)

## 2020-12-29 NOTE — Progress Notes (Signed)
Chief Complaint:   OBESITY Jillian [name] (MR# [id number]) is a 65 y.o. female who presents for evaluation and treatment of obesity and related comorbidities. Current BMI is Body mass index is 35.99 kg/m. Jillian [name] has been struggling with her weight for many years and has been unsuccessful in either losing weight, maintaining weight loss, or reaching her healthy weight goal.  Jillian [name] was in our program previously, but she had to stop at the beginning of [place]. She is ready to get back on track with her weight loss journey now. Her previous weight with [place] started at 212 lbs to 200 lbs.  Jillian [name] is currently in the action stage of change and ready to dedicate time achieving and maintaining a healthier weight. Jillian [name] is interested in becoming our patient and working on intensive lifestyle modifications including (but not limited to) diet and exercise for weight loss.  Jillian [name]'s habits were reviewed today and are as follows: Her family eats meals together, she thinks her family will eat healthier with her, she struggles with family and or coworkers weight loss sabotage, her desired weight loss is 38 lbs, she started gaining weight after 30, her heaviest weight ever was 225 pounds, she has significant food cravings issues, she snacks frequently in the evenings, she skips meals frequently, she is frequently drinking liquids with calories, she frequently makes poor food choices, she frequently eats larger portions than normal and she struggles with emotional eating.  Depression Screen Jillian [name]'s Food and Mood (modified PHQ-9) score was 8.  Depression screen PHQ 2/9 12/28/2020  Decreased Interest 1  Down, Depressed, Hopeless 1  PHQ - 2 Score 2  Altered sleeping 0  Tired, decreased energy 0  Change in appetite 1  Feeling bad or failure about yourself  0  Trouble concentrating 1  Moving slowly or fidgety/restless 0  Suicidal thoughts 0  PHQ-9 Score 4  Difficult doing work/chores -    Subjective:   1. Other fatigue Jillian [name] admits to daytime somnolence and denies waking up still tired. [place] has a history of symptoms of daytime fatigue. Jillian [name] generally gets 5 hours of sleep per night, and states that she has generally restful sleep. Snoring is present. Apneic episodes are not present. Epworth Sleepiness Score is 4.  2. SOB (shortness of breath) on exertion Jillian [name] notes increasing shortness of breath with exercising and seems to be worsening over time with weight gain. She notes getting out of breath sooner with activity than she used to. This has not gotten worse recently. Jillian [name] denies shortness of breath at rest or orthopnea.  3. Pre-diabetes Jillian [name] is stable on metformin. She is ready to work on weight loss again.  4. Vitamin D deficiency Jillian [name] is on multivitamins, and she has a history of Vit D deficiency. She notes fatigue.  5. Other hyperlipidemia Jillian [name] is on Lipitor, and she is working on diet and exercise.  Assessment/Plan:   1. Other fatigue Jillian [name] does feel that her weight is causing her energy to be lower than it should be. Fatigue may be related to obesity, depression or many other causes. Labs will be ordered, and in the meanwhile, Jillian [name] will focus on self care including making healthy food choices, increasing physical activity and focusing on stress reduction.  - Comprehensive metabolic panel - TSH - T3 - T4 - CBC With Differential - EKG 12-Lead  2. SOB (shortness of breath) on exertion Jillian [name] does feel that she gets out of breath more easily that she used to when  she exercises. Jillian [name]'s shortness of breath appears to be obesity related and exercise induced. She has agreed to work on weight loss and gradually increase exercise to treat her exercise induced shortness of breath. Will continue to monitor closely.  3. Pre-diabetes Jillian [name] will continue metformin, and we will check labs today. She will start her journaling plan, and will  work on weight loss, exercise, and decreasing simple carbohydrates to help decrease the risk of diabetes.   - Insulin, random - Hemoglobin A1c  4. Vitamin D deficiency Low Vitamin D level contributes to fatigue and are associated with obesity, breast, and colon cancer. We will check labs today. Jillian [name] will continue taking Vitamin D and will follow-up for routine testing of Vitamin D, at least 2-3 times per year to avoid over-replacement.  - VITAMIN D 25 Hydroxy (Vit-D Deficiency, Fractures)  5. Other hyperlipidemia Cardiovascular risk and specific lipid/LDL goals reviewed. We discussed several lifestyle modifications today. We will check labs today. Jillian [name] will continue Lipitor, and will start journaling and will work on diet, exercise and weight loss efforts. Orders and follow up as documented in patient record.   - Lipid Panel With LDL/HDL Ratio  6. Screening for depression Jillian [name] had a positive depression screening. Depression is commonly associated with obesity and often results in emotional eating behaviors. We will monitor this closely and work on CBT to help improve the non-hunger eating patterns. Referral to Psychology may be required if no improvement is seen as she continues in our clinic.  7. Obesity with current BMI 36.0 Jillian [name] is currently in the action stage of change and her goal is to continue with weight loss efforts. I recommend Jillian [name] begin the structured treatment plan as follows:  She has agreed to start keeping a food journal and adhering to recommended goals of 1300-1600 calories and 85+ grams of protein daily.  Exercise goals: No exercise has been prescribed for now, while we concentrate on nutritional changes.   Behavioral modification strategies: increasing lean protein intake and keeping a strict food journal.  She was informed of the importance of frequent follow-up visits to maximize her success with intensive lifestyle modifications for her multiple  health conditions. She was informed we would discuss her lab results at her next visit unless there is a critical issue that needs to be addressed sooner. Jillian [name] agreed to keep her next visit at the agreed upon time to discuss these results.  Objective:   Blood pressure (!) 142/85, pulse 77, temperature 98.1 F (36.7 C), height 5\' 6"  (1.676 m), weight 223 lb (101.2 kg), SpO2 99 %. Body mass index is 35.99 kg/m.  EKG: Normal sinus rhythm, rate 71 BPM.  Indirect Calorimeter completed today shows a VO2 of 260 and a REE of 1807.  Her calculated basal metabolic rate is 9371 thus her basal metabolic rate is better than expected.  General: Cooperative, alert, well developed, in no acute distress. HEENT: Conjunctivae and lids unremarkable. Cardiovascular: Regular rhythm.  Lungs: Normal work of breathing. Neurologic: No focal deficits.   Lab Results  Component Value Date   CREATININE 0.80 12/28/2020   BUN 14 12/28/2020   NA 143 12/28/2020   K 4.9 12/28/2020   CL 104 12/28/2020   CO2 22 12/28/2020   Lab Results  Component Value Date   ALT 23 12/28/2020   AST 23 12/28/2020   ALKPHOS 85 12/28/2020   BILITOT 0.4 12/28/2020   Lab Results  Component Value Date   HGBA1C 5.9 (H) 12/28/2020   HGBA1C  6.0 07/11/2020   HGBA1C 5.5 05/12/2019   HGBA1C 5.6 10/27/2018   HGBA1C 5.6 06/12/2018   Lab Results  Component Value Date   INSULIN 11.3 12/28/2020   INSULIN 10.8 05/12/2019   INSULIN 7.4 10/27/2018   INSULIN 8.5 06/12/2018   Lab Results  Component Value Date   TSH 2.000 12/28/2020   Lab Results  Component Value Date   CHOL 198 12/28/2020   HDL 85 12/28/2020   LDLCALC 103 (H) 12/28/2020   LDLDIRECT 158.6 08/18/2013   TRIG 53 12/28/2020   CHOLHDL 2 07/11/2020   Lab Results  Component Value Date   WBC 4.9 12/28/2020   HGB 13.8 12/28/2020   HCT 44.2 12/28/2020   MCV 88 12/28/2020   PLT 257.0 07/11/2020   No results found for: IRON, TIBC, FERRITIN  Attestation  Statements:   Reviewed by clinician on day of visit: allergies, medications, problem list, medical history, surgical history, family history, social history, and previous encounter notes.  Time spent on visit including pre-visit chart review and post-visit charting and care was 42 minutes.    I, Trixie Dredge, am acting as Location manager for Dennard Nip, MD.  I have reviewed the above documentation for accuracy and completeness, and I agree with the above. - Dennard Nip, MD

## 2021-01-02 ENCOUNTER — Ambulatory Visit: Admitting: Physical Medicine and Rehabilitation

## 2021-01-09 ENCOUNTER — Telehealth: Payer: Self-pay

## 2021-01-09 NOTE — Telephone Encounter (Signed)
Pt called and would like to cancel her appt for the 26th with Dr. Ernestina Patches and sch for a later date.

## 2021-01-11 ENCOUNTER — Ambulatory Visit (INDEPENDENT_AMBULATORY_CARE_PROVIDER_SITE_OTHER): Payer: Medicare Other | Admitting: Family Medicine

## 2021-01-11 ENCOUNTER — Other Ambulatory Visit: Payer: Self-pay

## 2021-01-11 ENCOUNTER — Encounter (INDEPENDENT_AMBULATORY_CARE_PROVIDER_SITE_OTHER): Payer: Self-pay | Admitting: Family Medicine

## 2021-01-11 VITALS — BP 141/90 | HR 92 | Temp 98.0°F | Ht 66.0 in | Wt 220.0 lb

## 2021-01-11 DIAGNOSIS — E7849 Other hyperlipidemia: Secondary | ICD-10-CM

## 2021-01-11 DIAGNOSIS — Z6835 Body mass index (BMI) 35.0-35.9, adult: Secondary | ICD-10-CM | POA: Diagnosis not present

## 2021-01-11 DIAGNOSIS — E7800 Pure hypercholesterolemia, unspecified: Secondary | ICD-10-CM

## 2021-01-11 DIAGNOSIS — R7303 Prediabetes: Secondary | ICD-10-CM

## 2021-01-12 ENCOUNTER — Ambulatory Visit: Admitting: Physical Medicine and Rehabilitation

## 2021-01-12 NOTE — Progress Notes (Signed)
Chief Complaint:   OBESITY Jillian [name] is here to discuss her progress with her obesity treatment plan along with follow-up of her obesity related diagnoses. Jillian [name] is on keeping a food journal and adhering to recommended goals of 1300-1600 calories and 85+ grams of protein daily and states she is following her eating plan approximately 80% of the time. Jillian [name] states she is walking and on the elliptical for 40 minutes 4 times per week.  Today's visit was #: 2 Starting weight: 223 lbs Starting date: 12/29/2019 Today's weight: 220 lbs Today's date: 01/11/2021 Total lbs lost to date: 3 Total lbs lost since last in-office visit: 3  Interim History: Jillian [name] has done well with journaling and meal planning. Her hunger is mostly controlled. She still has some less ideal snack foods in the house, but she is working on bringing in healthy options.  Subjective:   1. Pre-diabetes Jillian [name] is on metformin, and her A1c is slightly increasing. She has done well with diet in the last 2 weeks and the level is expected to improve by next labs. I discussed labs with the patient today.  2. Hyperlipidemia, pure [name] LDL is mildly elevated at 103 (from 85). She has been on Lipitor 20 mg, but she has been getting back on track with her meal plan and weight loss. I discussed labs with the patient today.  Assessment/Plan:   1. Pre-diabetes Jillian [name] will continue metformin and we will recheck labs in 3 months. She will continue to work on weight loss, exercise, and decreasing simple carbohydrates to help decrease the risk of diabetes.   2. Hyperlipidemia, pure Cardiovascular risk and specific lipid/LDL goals reviewed.  We discussed several lifestyle modifications today. Jillian [name] will continue to work on diet, exercise and weight loss efforts. We will recheck labs in 3 months. Orders and follow up as documented in patient record.   3. Obesity with current BMI 35.6 [name] is currently in the action stage  of change. As such, her goal is to continue with weight loss efforts. She has agreed to keeping a food journal and adhering to recommended goals of 1300-1600 calories and 85+ grams of protein daily.   Exercise goals: As is.  Behavioral modification strategies: increasing lean protein intake, meal planning and cooking strategies and dealing with family or coworker sabotage.  Jillian [name] has agreed to follow-up with our clinic in 2 to 3 weeks. She was informed of the importance of frequent follow-up visits to maximize her success with intensive lifestyle modifications for her multiple health conditions.   Objective:   Blood pressure (!) 141/90, pulse 92, temperature 98 F (36.7 C), height 5\' 6"  (1.676 m), weight 220 lb (99.8 kg), SpO2 100 %. Body mass index is 35.51 kg/m.  General: Cooperative, alert, well developed, in no acute distress. HEENT: Conjunctivae and lids unremarkable. Cardiovascular: Regular rhythm.  Lungs: Normal work of breathing. Neurologic: No focal deficits.   Lab Results  Component Value Date   CREATININE 0.80 12/28/2020   BUN 14 12/28/2020   NA 143 12/28/2020   K 4.9 12/28/2020   CL 104 12/28/2020   CO2 22 12/28/2020   Lab Results  Component Value Date   ALT 23 12/28/2020   AST 23 12/28/2020   ALKPHOS 85 12/28/2020   BILITOT 0.4 12/28/2020   Lab Results  Component Value Date   HGBA1C 5.9 (H) 12/28/2020   HGBA1C 6.0 07/11/2020   HGBA1C 5.5 05/12/2019   HGBA1C 5.6 10/27/2018   HGBA1C 5.6 06/12/2018   Lab  Results  Component Value Date   INSULIN 11.3 12/28/2020   INSULIN 10.8 05/12/2019   INSULIN 7.4 10/27/2018   INSULIN 8.5 06/12/2018   Lab Results  Component Value Date   TSH 2.000 12/28/2020   Lab Results  Component Value Date   CHOL 198 12/28/2020   HDL 85 12/28/2020   LDLCALC 103 (H) 12/28/2020   LDLDIRECT 158.6 08/18/2013   TRIG 53 12/28/2020   CHOLHDL 2 07/11/2020   Lab Results  Component Value Date   WBC 4.9 12/28/2020   HGB 13.8  12/28/2020   HCT 44.2 12/28/2020   MCV 88 12/28/2020   PLT 257.0 07/11/2020   No results found for: IRON, TIBC, FERRITIN  Attestation Statements:   Reviewed by clinician on day of visit: allergies, medications, problem list, medical history, surgical history, family history, social history, and previous encounter notes.  Time spent on visit including pre-visit chart review and post-visit care and charting was 33 minutes.    I, Trixie Dredge, am acting as transcriptionist for Dennard Nip, MD.  I have reviewed the above documentation for accuracy and completeness, and I agree with the above. -  Dennard Nip, MD

## 2021-02-01 ENCOUNTER — Ambulatory Visit (INDEPENDENT_AMBULATORY_CARE_PROVIDER_SITE_OTHER): Payer: Medicare Other | Admitting: Family Medicine

## 2021-02-01 ENCOUNTER — Other Ambulatory Visit: Payer: Self-pay

## 2021-02-01 VITALS — BP 143/78 | HR 78 | Temp 98.4°F | Ht 66.0 in | Wt 218.0 lb

## 2021-02-01 DIAGNOSIS — Z6834 Body mass index (BMI) 34.0-34.9, adult: Secondary | ICD-10-CM | POA: Diagnosis not present

## 2021-02-01 DIAGNOSIS — E669 Obesity, unspecified: Secondary | ICD-10-CM

## 2021-02-01 DIAGNOSIS — R7303 Prediabetes: Secondary | ICD-10-CM | POA: Diagnosis not present

## 2021-02-11 IMAGING — MR MR LUMBAR SPINE W/O CM
1 series · 15 of 15 positions shown · non-contrast
Comparison: None.

CLINICAL DATA: Chronic low back pain and bilateral lower extremity
pain.

EXAM:
MRI LUMBAR SPINE WITHOUT CONTRAST
TECHNIQUE: Multiplanar, multisequence MR imaging of the lumbar spine was
performed. No intravenous contrast was administered.

[Series 5: T2 · sagittal · 4.0mm · 0.73mm/px · 15 of 15 slices shown]
[im 1/15]
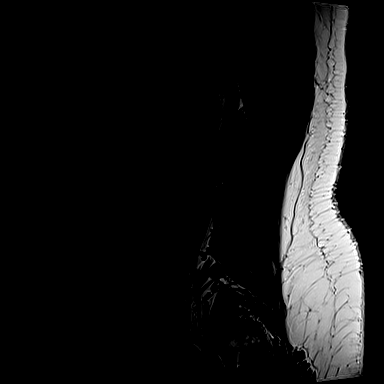
[im 2/15]
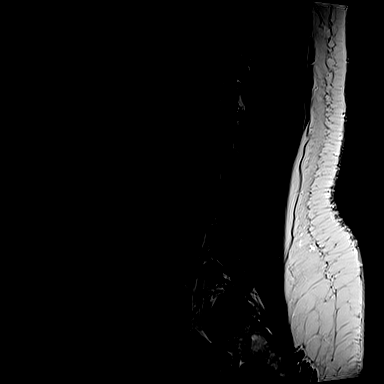
[im 3/15]
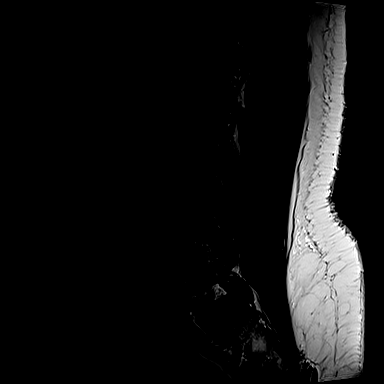
[im 4/15]
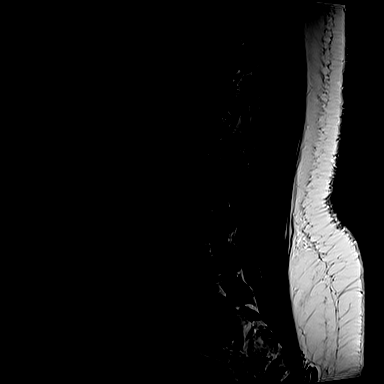
[im 5/15]
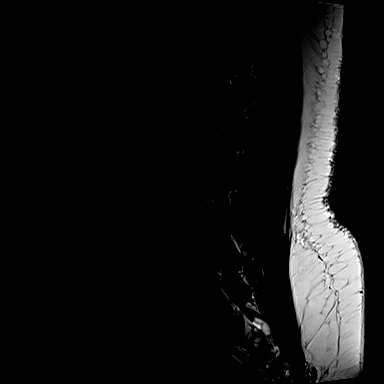
[im 6/15]
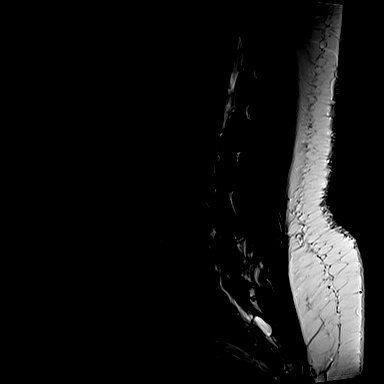
[im 7/15]
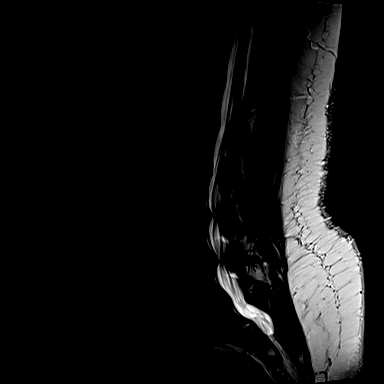
[im 8/15]
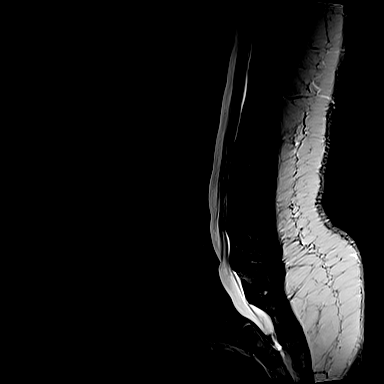
[im 9/15]
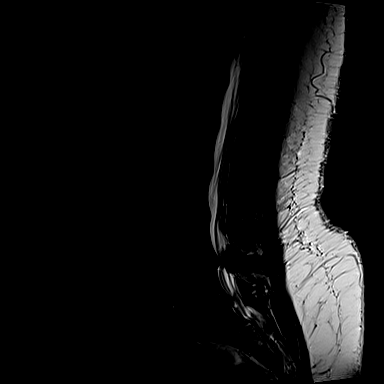
[im 10/15]
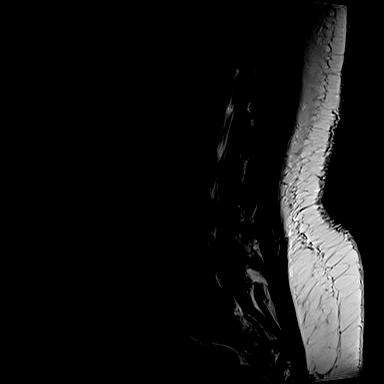
[im 11/15]
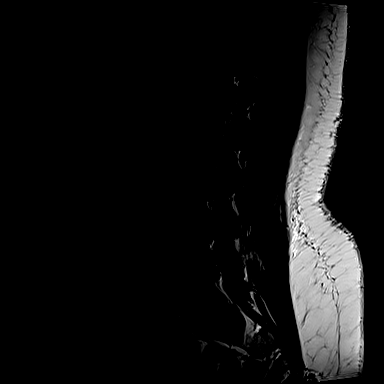
[im 12/15]
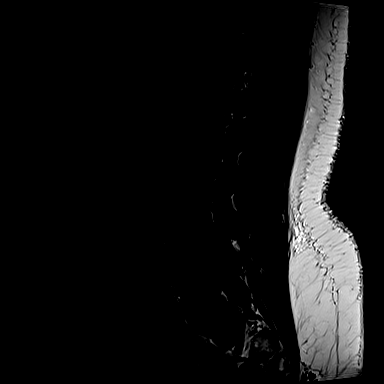
[im 13/15]
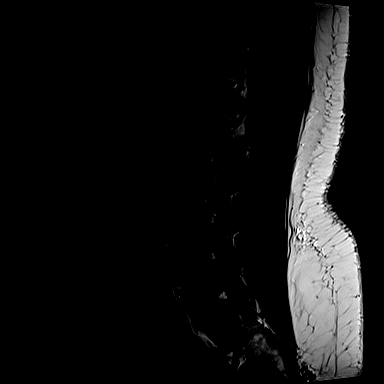
[im 14/15]
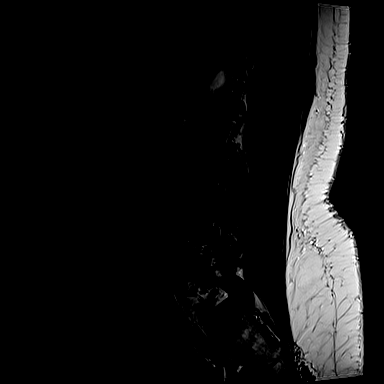
[im 15/15]
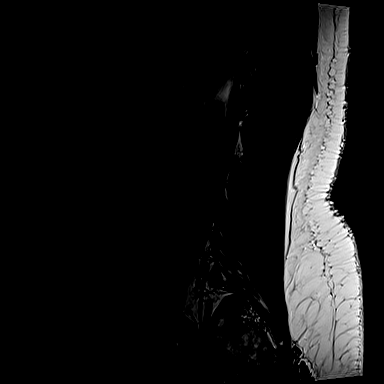

[15 of 15 positions shown; findings below may reference images not displayed]

FINDINGS: Segmentation:  Standard.

Alignment:  Physiologic.

Vertebrae:  No fracture, evidence of discitis, or bone lesion.

Conus medullaris and cauda equina: Conus extends to the L2 level.
Conus and cauda equina appear normal.

Paraspinal and other soft tissues: Negative

Disc levels:

T12-L1: Normal disc space and facet joints. There is no spinal canal
stenosis. No neural foraminal stenosis.

L1-L2: Normal disc space and facet joints. There is no spinal canal
stenosis. No neural foraminal stenosis.

L2-L3: Normal disc space and facet joints. There is no spinal canal
stenosis. No neural foraminal stenosis.

L3-L4: Mild disc bulge. There is no spinal canal stenosis. Mild left
neural foraminal stenosis.

L4-L5: Intermediate sized diffuse disc bulge. Mild spinal canal
stenosis. No neural foraminal stenosis.

L5-S1: Normal disc space and facet joints. There is no spinal canal
stenosis. No neural foraminal stenosis.

Visualized sacrum: Normal.
IMPRESSION: 1. L4-5 mild spinal canal stenosis due to intermediate sized disc
bulge. Impingement of the descending L5 nerve roots in the lateral
recesses could cause corresponding radiculopathy.
2. Mild left L3 neural foraminal stenosis.

## 2021-02-13 NOTE — Progress Notes (Signed)
Chief Complaint:   OBESITY Jillian [name] is here to discuss her progress with her obesity treatment plan along with follow-up of her obesity related diagnoses.   Today's visit was #: 3 Starting weight: 223 lbs Starting date: 12/29/2019 Today's weight: 218 lbs Today's date: 02/01/2021 Weight change since last visit: 2 lbs Total lbs lost to date: 5 lbs Body mass index is 35.19 kg/m.  Total weight loss percentage to date: -2.24%  Interim History:  Jillian [name] is here for a follow up office visit and she is following the meal plan without concerns or issues.  Patient's meal and food recall appears to be accurate and consistent with what is on the plan.  When on plan, her hunger and cravings are well controlled.    Quinnetta notices that her clothes are looser.  Increased movement lately.  Doing [employer] Exercise Yoga videos, which helps with her sciatica.  Current Meal Plan: keeping a food journal and adhering to recommended goals of 1300-1600 calories and 85 grams of protein for 85% of the time.  Current Exercise Plan: Yoga, walking, gardening for 30-60 minutes 3-4 times per week.  Assessment/Plan:   1. Prediabetes Not at goal. Goal is HgbA1c < 5.7.  Medication: metformin 500 mg daily.  Cravings under good control.  No longer having cravings after dinner like in the past due to eating her dinner proteins.  Plan:  Continue metformin.  She will continue to focus on protein-rich, low simple carbohydrate foods. We reviewed the importance of hydration, regular exercise for stress reduction, and restorative sleep.   Lab Results  Component Value Date   HGBA1C 5.9 (H) 12/28/2020   Lab Results  Component Value Date   INSULIN 11.3 12/28/2020   INSULIN 10.8 05/12/2019   INSULIN 7.4 10/27/2018   INSULIN 8.5 06/12/2018   2. Class 1 obesity with serious comorbidity and body mass index (BMI) of 34.0 to 34.9 in adult, unspecified obesity type  Course: [place] is currently in the  action stage of change. As such, her goal is to continue with weight loss efforts.   Nutrition goals: She has agreed to keeping a food journal and adhering to recommended goals of 1300-1600 calories and 85 grams of protein.   Exercise goals:  As is.  Behavioral modification strategies: increasing lean protein intake, decreasing simple carbohydrates, no skipping meals, and planning for success.  [name] has agreed to follow-up with our clinic in 2-3 weeks. She was informed of the importance of frequent follow-up visits to maximize her success with intensive lifestyle modifications for her multiple health conditions.   Objective:   Blood pressure (!) 143/78, pulse 78, temperature 98.4 F (36.9 C), height 5\' 6"  (1.676 m), weight 218 lb (98.9 kg), SpO2 99 %. Body mass index is 35.19 kg/m.  General: Cooperative, alert, well developed, in no acute distress. HEENT: Conjunctivae and lids unremarkable. Cardiovascular: Regular rhythm.  Lungs: Normal work of breathing. Neurologic: No focal deficits.   Lab Results  Component Value Date   CREATININE 0.80 12/28/2020   BUN 14 12/28/2020   NA 143 12/28/2020   K 4.9 12/28/2020   CL 104 12/28/2020   CO2 22 12/28/2020   Lab Results  Component Value Date   ALT 23 12/28/2020   AST 23 12/28/2020   ALKPHOS 85 12/28/2020   BILITOT 0.4 12/28/2020   Lab Results  Component Value Date   HGBA1C 5.9 (H) 12/28/2020   HGBA1C 6.0 07/11/2020   HGBA1C 5.5 05/12/2019   HGBA1C  5.6 10/27/2018   HGBA1C 5.6 06/12/2018   Lab Results  Component Value Date   INSULIN 11.3 12/28/2020   INSULIN 10.8 05/12/2019   INSULIN 7.4 10/27/2018   INSULIN 8.5 06/12/2018   Lab Results  Component Value Date   TSH 2.000 12/28/2020   Lab Results  Component Value Date   CHOL 198 12/28/2020   HDL 85 12/28/2020   LDLCALC 103 (H) 12/28/2020   LDLDIRECT 158.6 08/18/2013   TRIG 53 12/28/2020   CHOLHDL 2 07/11/2020   Lab Results  Component Value Date   WBC 4.9  12/28/2020   HGB 13.8 12/28/2020   HCT 44.2 12/28/2020   MCV 88 12/28/2020   PLT 257.0 07/11/2020   Obesity Behavioral Intervention:   Approximately 15 minutes were spent on the discussion below.  ASK: We discussed the diagnosis of obesity with Prisca today and Jillian [name] agreed to give Jillian [name] permission to discuss obesity behavioral modification therapy today.  ASSESS: Jillian [name] has the diagnosis of obesity and her BMI today is 35.2. Jillian [name] is in the action stage of change.   ADVISE: Jillian [name] was educated on the multiple health risks of obesity as well as the benefit of weight loss to improve her health. She was advised of the need for long term treatment and the importance of lifestyle modifications to improve her current health and to decrease her risk of future health problems.  AGREE: Multiple dietary modification options and treatment options were discussed and Jillian [name] agreed to follow the recommendations documented in the above note.  ARRANGE: Jillian [name] was educated on the importance of frequent visits to treat obesity as outlined per CMS and USPSTF guidelines and agreed to schedule her next follow up appointment today.  Attestation Statements:   Reviewed by clinician on day of visit: allergies, medications, problem list, medical history, surgical history, family history, social history, and previous encounter notes.  I, Water quality scientist, CMA, am acting as Location manager for [employer], DO.  I have reviewed the above documentation for accuracy and completeness, and I agree with the above. Marjory Sneddon, D.O.  The [place] was signed into law in 2016 which includes the topic of electronic health records.  This provides immediate access to information in MyChart.  This includes consultation notes, operative notes, office notes, lab results and pathology reports.  If you have any questions about what you read please let Jillian [name] know at your next visit so we can discuss  your concerns and take corrective action if need be.  We are right here with you.

## 2021-02-22 ENCOUNTER — Ambulatory Visit (INDEPENDENT_AMBULATORY_CARE_PROVIDER_SITE_OTHER): Payer: Medicare Other | Admitting: Family Medicine

## 2021-02-22 ENCOUNTER — Other Ambulatory Visit: Payer: Self-pay

## 2021-02-22 ENCOUNTER — Encounter (INDEPENDENT_AMBULATORY_CARE_PROVIDER_SITE_OTHER): Payer: Self-pay | Admitting: Family Medicine

## 2021-02-22 VITALS — BP 135/85 | HR 89 | Temp 98.6°F | Ht 66.0 in | Wt 215.0 lb

## 2021-02-22 DIAGNOSIS — E669 Obesity, unspecified: Secondary | ICD-10-CM | POA: Diagnosis not present

## 2021-02-22 DIAGNOSIS — Z6834 Body mass index (BMI) 34.0-34.9, adult: Secondary | ICD-10-CM

## 2021-02-22 DIAGNOSIS — I10 Essential (primary) hypertension: Secondary | ICD-10-CM

## 2021-03-02 NOTE — Progress Notes (Signed)
Chief Complaint:   OBESITY Jillian [name] is here to discuss her progress with her obesity treatment plan along with follow-up of her obesity related diagnoses.   Today's visit was #: 4 Starting weight: 223 lbs Starting date: 12/29/2019 Today's weight: 215 lbs Today's date: 02/22/2021 Weight change since last visit: 3 lbs Total lbs lost to date: 8 lbs Body mass index is 34.7 kg/m.  Total weight loss percentage to date: -3.59%  Interim History:  Jillian [name] is hitting 100 ounces of water per day.  No issues with plan.  Getting her protein in more consistently now.  Usually hits her goals.    We reviewed her meal plan and questions were answered.  Patient's food recall appears to be accurate and consistent with what is on plan when she is following it.   When eating on plan, her hunger and cravings are well controlled.     Plan:  asked to bring in sheet of date, calories, and total grams of protein per day so we can see if she is hitting her goals.  Current Meal Plan: keeping a food journal and adhering to recommended goals of 1300-1600 calories and 85 grams of protein for 75% of the time.  Current Exercise Plan: Walking for 30 minutes 1-2 times per week.  Assessment/Plan:   1. Essential hypertension Not at goal. Medications: None.  At home, blood pressure runs 120-130s/70-80s.  Checks it twice daily.  She has been aware of salt intake.  Plan:  Blood pressure is essentially at goal.  Avoid buying foods that are: processed, frozen, or prepackaged to avoid excess salt. We will watch for signs of hypotension as she continues lifestyle modifications. We will continue to monitor closely alongside her PCP and/or Specialist.  Regular follow up with PCP and specialists was also encouraged.   BP Readings from Last 3 Encounters:  02/22/21 135/85  02/01/21 (!) 143/78  01/11/21 (!) 141/90   Lab Results  Component Value Date   CREATININE 0.80 12/28/2020   2. Class 1 obesity with serious comorbidity  and body mass index (BMI) of 34.0 to 34.9 in adult, unspecified obesity type  Course: Jillian [name] is currently in the action stage of change. As such, her goal is to continue with weight loss efforts.   Nutrition goals: She has agreed to keeping a food journal and adhering to recommended goals of 1300-1600 calories and 85 grams of protein.   Exercise goals:  As is.  Behavioral modification strategies: increasing lean protein intake and decreasing simple carbohydrates.  Jillian [name] has agreed to follow-up with our clinic in 3 weeks with Dr. Leafy Ro. She was informed of the importance of frequent follow-up visits to maximize her success with intensive lifestyle modifications for her multiple health conditions.   Objective:   Blood pressure 135/85, pulse 89, temperature 98.6 F (37 C), height 5\' 6"  (1.676 m), weight 215 lb (97.5 kg), SpO2 98 %. Body mass index is 34.7 kg/m.  General: Cooperative, alert, well developed, in no acute distress. HEENT: Conjunctivae and lids unremarkable. Cardiovascular: Regular rhythm.  Lungs: Normal work of breathing. Neurologic: No focal deficits.   Lab Results  Component Value Date   CREATININE 0.80 12/28/2020   BUN 14 12/28/2020   NA 143 12/28/2020   K 4.9 12/28/2020   CL 104 12/28/2020   CO2 22 12/28/2020   Lab Results  Component Value Date   ALT 23 12/28/2020   AST 23 12/28/2020   ALKPHOS 85 12/28/2020   BILITOT 0.4 12/28/2020  Lab Results  Component Value Date   HGBA1C 5.9 (H) 12/28/2020   HGBA1C 6.0 07/11/2020   HGBA1C 5.5 05/12/2019   HGBA1C 5.6 10/27/2018   HGBA1C 5.6 06/12/2018   Lab Results  Component Value Date   INSULIN 11.3 12/28/2020   INSULIN 10.8 05/12/2019   INSULIN 7.4 10/27/2018   INSULIN 8.5 06/12/2018   Lab Results  Component Value Date   TSH 2.000 12/28/2020   Lab Results  Component Value Date   CHOL 198 12/28/2020   HDL 85 12/28/2020   LDLCALC 103 (H) 12/28/2020   LDLDIRECT 158.6 08/18/2013   TRIG 53  12/28/2020   CHOLHDL 2 07/11/2020   Lab Results  Component Value Date   VD25OH 54.9 12/28/2020   VD25OH 46.51 07/11/2020   VD25OH 49.35 08/10/2019   Lab Results  Component Value Date   WBC 4.9 12/28/2020   HGB 13.8 12/28/2020   HCT 44.2 12/28/2020   MCV 88 12/28/2020   PLT 257.0 07/11/2020   Obesity Behavioral Intervention:   Approximately 15 minutes were spent on the discussion below.  ASK: We discussed the diagnosis of obesity with Jillian [name] today and Jillian [name] agreed to give [place] permission to discuss obesity behavioral modification therapy today.  ASSESS: Jillian [name] has the diagnosis of obesity and her BMI today is 34.8. Jillian [name] is in the action stage of change.   ADVISE: Jillian [name] was educated on the multiple health risks of obesity as well as the benefit of weight loss to improve her health. She was advised of the need for long term treatment and the importance of lifestyle modifications to improve her current health and to decrease her risk of future health problems.  AGREE: Multiple dietary modification options and treatment options were discussed and Jillian [name] agreed to follow the recommendations documented in the above note.  ARRANGE: Jillian [name] was educated on the importance of frequent visits to treat obesity as outlined per CMS and USPSTF guidelines and agreed to schedule her next follow up appointment today.  Attestation Statements:   Reviewed by clinician on day of visit: allergies, medications, problem list, medical history, surgical history, family history, social history, and previous encounter notes.  I, Water quality scientist, CMA, am acting as Location manager for [employer], DO.  I have reviewed the above documentation for accuracy and completeness, and I agree with the above. Marjory Sneddon, D.O.  The [place] was signed into law in 2016 which includes the topic of electronic health records.  This provides immediate access to information in MyChart.   This includes consultation notes, operative notes, office notes, lab results and pathology reports.  If you have any questions about what you read please let us know at your next visit so we can discuss your concerns and take corrective action if need be.  We are right here with you.

## 2021-03-15 ENCOUNTER — Other Ambulatory Visit: Payer: Self-pay

## 2021-03-15 ENCOUNTER — Ambulatory Visit (INDEPENDENT_AMBULATORY_CARE_PROVIDER_SITE_OTHER): Payer: Medicare Other | Admitting: Family Medicine

## 2021-03-15 ENCOUNTER — Encounter (INDEPENDENT_AMBULATORY_CARE_PROVIDER_SITE_OTHER): Payer: Self-pay | Admitting: Family Medicine

## 2021-03-15 VITALS — BP 137/81 | HR 87 | Temp 98.8°F | Ht 66.0 in | Wt 214.0 lb

## 2021-03-15 DIAGNOSIS — Z6834 Body mass index (BMI) 34.0-34.9, adult: Secondary | ICD-10-CM | POA: Diagnosis not present

## 2021-03-15 DIAGNOSIS — E860 Dehydration: Secondary | ICD-10-CM | POA: Diagnosis not present

## 2021-03-15 DIAGNOSIS — E669 Obesity, unspecified: Secondary | ICD-10-CM

## 2021-03-17 ENCOUNTER — Encounter: Payer: Self-pay | Admitting: Family Medicine

## 2021-03-17 ENCOUNTER — Other Ambulatory Visit: Payer: Self-pay

## 2021-03-17 ENCOUNTER — Telehealth: Payer: Self-pay

## 2021-03-17 ENCOUNTER — Encounter: Payer: Self-pay | Admitting: Physical Medicine and Rehabilitation

## 2021-03-17 ENCOUNTER — Ambulatory Visit (INDEPENDENT_AMBULATORY_CARE_PROVIDER_SITE_OTHER): Payer: Medicare Other | Admitting: Family Medicine

## 2021-03-17 DIAGNOSIS — M5430 Sciatica, unspecified side: Secondary | ICD-10-CM

## 2021-03-17 MED ORDER — GABAPENTIN 100 MG PO CAPS: 100.0000 mg | ORAL_CAPSULE | Freq: Every day | ORAL | 1 refills | Status: DC

## 2021-03-17 MED ORDER — MELOXICAM 15 MG PO TABS: 15.0000 mg | ORAL_TABLET | Freq: Every day | ORAL | 1 refills | Status: DC

## 2021-03-17 NOTE — Telephone Encounter (Signed)
Pt already has appt with Dr Damita Dunnings 03/17/21 at 11:30. Sending note to DR Damita Dunnings and Janett Billow CMA.

## 2021-03-17 NOTE — Patient Instructions (Addendum)
Meloxicam with food.  Gabapentin if needed. Update me as needed.  Keep stretching.   Take care.  Glad to see you.

## 2021-03-17 NOTE — Telephone Encounter (Signed)
Will see at OV.  Thanks.  

## 2021-03-17 NOTE — Progress Notes (Signed)
This visit occurred during the SARS-CoV-2 public health emergency.  Safety protocols were in place, including screening questions prior to the visit, additional usage of staff PPE, and extensive cleaning of exam room while observing appropriate contact time as indicated for disinfecting solutions.  L buttock pain radiating down the L leg, to the L heel.  Sore in the area sitting.  Not numb but doesn't feel normal.  Taking tylenol, using heat and ice w/o relief.  No trauma.  She likes to garden and work in the yard.  No FCNAVD.  She never felt a pop or a snap.  No rash.  No R leg sx.  No B/B sx.  She has back exercises for sciatica, does those at baseline.    H/o meloxicam use in ~10/2020.  Last A1c 5.9.    Meds, vitals, and allergies reviewed.   ROS: Per HPI unless specifically indicated in ROS section   GEN: nad, alert and oriented HEENT:ncat NECK: supple w/o LA CV: rrr.  PULM: ctab, no inc wob ABD: soft, +bs EXT: no edema SKIN: Well-perfused. B SLR neg. strength and sensation grossly intact in lower extremities.  Able to bear weight.

## 2021-03-17 NOTE — Telephone Encounter (Signed)
[facility] - Client TELEPHONE ADVICE RECORD [facility] Patient Name: Jillian [name] ES Gender: Female DOB: 12-Feb-1956 Age: 65 Y 2 M 23 D Return Phone Number: [phone] (Primary) Address: City/ State/ Zip: [place] [address] Client [facility] - Client Client Site [place] - Day Physician Alma Friendly - NP Contact Type Call Who Is Calling Patient / Member / Family / Caregiver Call Type Triage / Clinical Relationship To Patient Self Return Phone Number [phone] (Primary) Chief Complaint NUMBNESS/TINGLING- sudden on one side of the body or face Reason for Call Symptomatic / Request for [place] states she feels like she impacted a nerve. Sx of tingling at her left buttocks down to left heel. Translation No Nurse Assessment Nurse: Rodney Cruise, RN, Hilliard Clark Date/Time (Eastern Time): 03/17/2021 8:55:30 AM Confirm and document reason for call. If symptomatic, describe symptoms. ---Caller states having pain from left buttocks down to left heel, started 3 days ago, history of sciatica, feels different, lower back dose not hurt at all, denies any swelling or redness of fever. Tylenol and Meloxicam for symptoms. Does the patient have any new or worsening symptoms? ---Yes Will a triage be completed? ---Yes Related visit to physician within the last 2 weeks? ---Yes Does the PT have any chronic conditions? (i.e. diabetes, asthma, this includes High risk factors for pregnancy, etc.) ---Yes List chronic conditions. ---Sciatica, Diabetes. Is this a behavioral health or substance abuse call? ---No Guidelines Guideline Title Affirmed Question Affirmed Notes Nurse Date/Time Eilene Ghazi Time) Leg Pain [1] Thigh or calf pain AND [2] only 1 side AND [3] present > 1 hour (Exception: chronic unchanged pain) Baxter, RN, Hilliard Clark 03/17/2021 8:59:01 AM PLEASE NOTE:  All timestamps contained within this report are represented as [place] Standard Time. CONFIDENTIALTY NOTICE: This fax transmission is intended only for the addressee. It contains information that is legally privileged, confidential or otherwise protected from use or disclosure. If you are not the intended recipient, you are strictly prohibited from reviewing, disclosing, copying using or disseminating any of this information or taking any action in reliance on or regarding this information. If you have received this fax in error, please notify us immediately by telephone so that we can arrange for its return to [place]. Phone: [phone], Toll-Free: [phone], Fax: [phone] Page: 2 of 2 Call Id: [id number] [place]. Time Eilene Ghazi Time) Disposition Final User 03/17/2021 8:53:35 AM Send to Urgent Mellody Dance 03/17/2021 9:03:36 AM See HCP within 4 Hours (or PCP triage) Yes Baxter, RN, Lillia Dallas Disagree/Comply Comply Caller Understands Yes PreDisposition Did not know what to do Care Advice Given Per Guideline SEE HCP (OR PCP TRIAGE) WITHIN 4 HOURS: CALL BACK IF: * You become worse CARE ADVICE given per Leg Pain (Adult) guideline. Referrals Warm transfer to backline

## 2021-03-19 DIAGNOSIS — M5430 Sciatica, unspecified side: Secondary | ICD-10-CM | POA: Insufficient documentation

## 2021-03-19 NOTE — Assessment & Plan Note (Signed)
Discussed options.  Would prefer to avoid steroids.  She agrees.  Anatomy discussed with patient. Meloxicam with food.  Gabapentin if needed.  NSAID cautions discussed with patient.  Discussed rationale for use of gabapentin.  Update me as needed.  Keep stretching.   Okay for outpatient follow-up.  No need for imaging at this point.

## 2021-03-20 ENCOUNTER — Telehealth: Payer: Self-pay | Admitting: Physical Medicine and Rehabilitation

## 2021-03-20 NOTE — Telephone Encounter (Signed)
Pt calling wanting an appt with Dr. Ernestina Patches. The best call back number is [phone].

## 2021-03-21 ENCOUNTER — Telehealth: Payer: Self-pay | Admitting: Physical Medicine and Rehabilitation

## 2021-03-21 NOTE — Telephone Encounter (Signed)
Left message #1

## 2021-03-21 NOTE — Telephone Encounter (Signed)
Pt returned call to [place]. Please call pt at [phone].

## 2021-03-21 NOTE — Progress Notes (Signed)
Chief Complaint:   OBESITY Jillian [name] is here to discuss her progress with her obesity treatment plan along with follow-up of her obesity related diagnoses. Jillian [name] is on keeping a food journal and adhering to recommended goals of 1300-1600 calories and 85 grams of protein daily and states she is following her eating plan approximately 75% of the time. Jillian [name] states she is walking for 30 minutes 2-3 times per week.  Today's visit was #: 5 Starting weight: 223 lbs Starting date: 12/29/2019 Today's weight: 214 lbs Today's date: 03/15/2021 Total lbs lost to date: 9 Total lbs lost since last in-office visit: 1  Interim History: Jillian [name] has done well with continuing with weight loss even with multiple celebration eating situations. Her hunger is controlled and she is doing well with meeting her calorie and protein goals.  Subjective:   1. Dehydration Jillian [name] is showing signs of early dehydration. She has been active outdoors and the heat and heat index has been very high. She notes her urine is more concentrated even with increased water.  Assessment/Plan:   1. Dehydration Jillian [name] will continue to increase her water intake and monitor her urine to make sure it is light colored.  2. Obesity with current BMI 34.7 Jillian [name] is currently in the action stage of change. As such, her goal is to continue with weight loss efforts. She has agreed to keeping a food journal and adhering to recommended goals of 1300-1600 calories and 85+ grams of protein daily.   We will recheck fasting labs and IC at her next visit.  Exercise goals: As is.  Behavioral modification strategies: increasing lean protein intake and increasing water intake.  Jillian [name] has agreed to follow-up with our clinic in 3 weeks. She was informed of the importance of frequent follow-up visits to maximize her success with intensive lifestyle modifications for her multiple health conditions.   Objective:   Blood pressure 137/81,  pulse 87, temperature 98.8 F (37.1 C), height '5\' 6"'$  (1.676 m), weight 214 lb (97.1 kg), SpO2 98 %. Body mass index is 34.54 kg/m.  General: Cooperative, alert, well developed, in no acute distress. HEENT: Conjunctivae and lids unremarkable. Cardiovascular: Regular rhythm.  Lungs: Normal work of breathing. Neurologic: No focal deficits.   Lab Results  Component Value Date   CREATININE 0.80 12/28/2020   BUN 14 12/28/2020   NA 143 12/28/2020   K 4.9 12/28/2020   CL 104 12/28/2020   CO2 22 12/28/2020   Lab Results  Component Value Date   ALT 23 12/28/2020   AST 23 12/28/2020   ALKPHOS 85 12/28/2020   BILITOT 0.4 12/28/2020   Lab Results  Component Value Date   HGBA1C 5.9 (H) 12/28/2020   HGBA1C 6.0 07/11/2020   HGBA1C 5.5 05/12/2019   HGBA1C 5.6 10/27/2018   HGBA1C 5.6 06/12/2018   Lab Results  Component Value Date   INSULIN 11.3 12/28/2020   INSULIN 10.8 05/12/2019   INSULIN 7.4 10/27/2018   INSULIN 8.5 06/12/2018   Lab Results  Component Value Date   TSH 2.000 12/28/2020   Lab Results  Component Value Date   CHOL 198 12/28/2020   HDL 85 12/28/2020   LDLCALC 103 (H) 12/28/2020   LDLDIRECT 158.6 08/18/2013   TRIG 53 12/28/2020   CHOLHDL 2 07/11/2020   Lab Results  Component Value Date   VD25OH 54.9 12/28/2020   VD25OH 46.51 07/11/2020   VD25OH 49.35 08/10/2019   Lab Results  Component Value Date   WBC 4.9 12/28/2020  HGB 13.8 12/28/2020   HCT 44.2 12/28/2020   MCV 88 12/28/2020   PLT 257.0 07/11/2020   No results found for: IRON, TIBC, FERRITIN  Attestation Statements:   Reviewed by clinician on day of visit: allergies, medications, problem list, medical history, surgical history, family history, social history, and previous encounter notes.  Time spent on visit including pre-visit chart review and post-visit care and charting was 30 minutes.    I, Trixie Dredge, am acting as transcriptionist for Dennard Nip, MD.  I have reviewed the  above documentation for accuracy and completeness, and I agree with the above. -  Dennard Nip, MD

## 2021-03-21 NOTE — Telephone Encounter (Signed)
Scheduled for 8/8 at 0815 with driver and no blood thinners.

## 2021-03-21 NOTE — Telephone Encounter (Signed)
See previous message

## 2021-03-27 ENCOUNTER — Other Ambulatory Visit: Payer: Self-pay

## 2021-03-27 ENCOUNTER — Ambulatory Visit (INDEPENDENT_AMBULATORY_CARE_PROVIDER_SITE_OTHER): Payer: Medicare Other | Admitting: Physical Medicine and Rehabilitation

## 2021-03-27 ENCOUNTER — Encounter: Payer: Self-pay | Admitting: Physical Medicine and Rehabilitation

## 2021-03-27 ENCOUNTER — Ambulatory Visit: Payer: Self-pay

## 2021-03-27 VITALS — BP 135/93 | HR 88

## 2021-03-27 DIAGNOSIS — M5416 Radiculopathy, lumbar region: Secondary | ICD-10-CM | POA: Diagnosis not present

## 2021-03-27 MED ORDER — BETAMETHASONE SOD PHOS & ACET 6 (3-3) MG/ML IJ SUSP
12.0000 mg | Freq: Once | INTRAMUSCULAR | Status: AC
  Administered 2021-03-27: 12 mg

## 2021-03-27 NOTE — Progress Notes (Signed)
Pt state lower back pain that travels to her buttock around to her left groin and down her leg to the heel. Pt state walking makes the pain worse. Pt state she takes pain meds and heating, ice to help ease her pain. Pt has hx of inj on 11/21/20 pt state it helped.  Numeric Pain Rating Scale and Functional Assessment Average Pain 7   In the last MONTH (on 0-10 scale) has pain interfered with the following?  1. General activity like being  able to carry out your everyday physical activities such as walking, climbing stairs, carrying groceries, or moving a chair?  Rating(9)   +Driver, -BT, -Dye Allergies.

## 2021-03-27 NOTE — Patient Instructions (Signed)

## 2021-03-27 NOTE — Progress Notes (Signed)
Jillian [name] - 65 y.o. female MRN [id number]  Date of birth: [birth year]  Office Visit Note: Visit Date: 03/27/2021 PCP: Pleas Koch, NP Referred by: Pleas Koch, NP  Subjective: Chief Complaint  Patient presents with   Lower Back - Pain   Left Leg - Pain   HPI:  Jillian [name] is a 65 y.o. female who comes in today for planned Left L5-S1 Lumbar Interlaminar epidural steroid injection with fluoroscopic guidance.  The patient has failed conservative care including home exercise, medications, time and activity modification.  This injection will be diagnostic and hopefully therapeutic.  Please see requesting physician notes for further details and justification. MRI reviewed with images and spine model.  MRI reviewed in the note below.  She had a right-sided interlaminar injection performed several months ago that did extremely well for right radicular symptoms and now she is having similar symptoms on the left.  She has lateral recess narrowing at L4-5 which could affect the L5 nerve roots.  She is having symptoms on the left more of an L5 and S1 distribution.  Probably will try to get the injection midline at L5-S1 to hopefully treat both areas.   ROS Otherwise per HPI.  Assessment & Plan: Visit Diagnoses:    ICD-10-CM   1. Lumbar radiculopathy  M54.16 XR C-ARM NO REPORT    Epidural Steroid injection    betamethasone acetate-betamethasone sodium phosphate (CELESTONE) injection 12 mg      Plan: No additional findings.   Meds & Orders:  Meds ordered this encounter  Medications   betamethasone acetate-betamethasone sodium phosphate (CELESTONE) injection 12 mg    Orders Placed This Encounter  Procedures   XR C-ARM NO REPORT   Epidural Steroid injection    Follow-up: Return if symptoms worsen or fail to improve.   Procedures: No procedures performed  Lumbar Epidural Steroid Injection - Interlaminar Approach with Fluoroscopic Guidance  Patient:  Jillian [name]      Date of Birth: 03-Apr-1956 MRN: [id number] PCP: Pleas Koch, NP      Visit Date: 03/27/2021   Universal Protocol:     Consent Given By: the patient  Position: PRONE  Additional Comments: Vital signs were monitored before and after the procedure. Patient was prepped and draped in the usual sterile fashion. The correct patient, procedure, and site was verified.   Injection Procedure Details:   Procedure diagnoses: Lumbar radiculopathy [M54.16]   Meds Administered:  Meds ordered this encounter  Medications   betamethasone acetate-betamethasone sodium phosphate (CELESTONE) injection 12 mg     Laterality:  Midline  Location/Site:  L5-S1  Needle: 3.5 in., 20 ga. Tuohy  Needle Placement: Paramedian epidural  Findings:   -Comments: Excellent flow of contrast into the epidural space.  Procedure Details: Using a paramedian approach from the side mentioned above, the region overlying the inferior lamina was localized under fluoroscopic visualization and the soft tissues overlying this structure were infiltrated with 4 ml. of 1% Lidocaine without Epinephrine. The Tuohy needle was inserted into the epidural space using a paramedian approach.   The epidural space was localized using loss of resistance along with counter oblique bi-planar fluoroscopic views.  After negative aspirate for air, blood, and CSF, a 2 ml. volume of Isovue-250 was injected into the epidural space and the flow of contrast was observed. Radiographs were obtained for documentation purposes.    The injectate was administered into the level noted above.   Additional Comments:  The patient tolerated the  procedure well Dressing: 2 x 2 sterile gauze and Band-Aid    Post-procedure details: Patient was observed during the procedure. Post-procedure instructions were reviewed.  Patient left the clinic in stable condition.   Clinical History: MRI LUMBAR SPINE WITHOUT CONTRAST    TECHNIQUE: Multiplanar, multisequence MR imaging of the lumbar spine was performed. No intravenous contrast was administered.   COMPARISON:  None.   FINDINGS: Segmentation:  Standard.   Alignment:  Physiologic.   Vertebrae:  No fracture, evidence of discitis, or bone lesion.   Conus medullaris and cauda equina: Conus extends to the L2 level. Conus and cauda equina appear normal.   Paraspinal and other soft tissues: Negative   Disc levels:   T12-L1: Normal disc space and facet joints. There is no spinal canal stenosis. No neural foraminal stenosis.   L1-L2: Normal disc space and facet joints. There is no spinal canal stenosis. No neural foraminal stenosis.   L2-L3: Normal disc space and facet joints. There is no spinal canal stenosis. No neural foraminal stenosis.   L3-L4: Mild disc bulge. There is no spinal canal stenosis. Mild left neural foraminal stenosis.   L4-L5: Intermediate sized diffuse disc bulge. Mild spinal canal stenosis. No neural foraminal stenosis.   L5-S1: Normal disc space and facet joints. There is no spinal canal stenosis. No neural foraminal stenosis.   Visualized sacrum: Normal.   IMPRESSION: 1. L4-5 mild spinal canal stenosis due to intermediate sized disc bulge. Impingement of the descending L5 nerve roots in the lateral recesses could cause corresponding radiculopathy. 2. Mild left L3 neural foraminal stenosis.     Electronically Signed   By: Ulyses Jarred M.D.   On: 11/22/2019 03:49     Objective:  VS:  HT:    WT:   BMI:     BP:(!) 135/93  HR:88bpm  TEMP: ( )  RESP:  Physical Exam Vitals and nursing note reviewed.  Constitutional:      General: She is not in acute distress.    Appearance: Normal appearance. She is not ill-appearing.  HENT:     Head: Normocephalic and atraumatic.     Right Ear: External ear normal.     Left Ear: External ear normal.  Eyes:     Extraocular Movements: Extraocular movements intact.   Cardiovascular:     Rate and Rhythm: Normal rate.     Pulses: Normal pulses.  Pulmonary:     Effort: Pulmonary effort is normal. No respiratory distress.  Abdominal:     General: There is no distension.     Palpations: Abdomen is soft.  Musculoskeletal:        General: Tenderness present.     Cervical back: Neck supple.     Right lower leg: No edema.     Left lower leg: No edema.     Comments: Patient has good distal strength with no pain over the greater trochanters.  No clonus or focal weakness.  Skin:    Findings: No erythema, lesion or rash.  Neurological:     General: No focal deficit present.     Mental Status: She is alert and oriented to person, place, and time.     Sensory: No sensory deficit.     Motor: No weakness or abnormal muscle tone.     Coordination: Coordination normal.  Psychiatric:        Mood and Affect: Mood normal.        Behavior: Behavior normal.     Imaging: XR C-ARM NO REPORT  Result Date: 03/27/2021 Please see Notes tab for imaging impression.

## 2021-03-28 NOTE — Procedures (Signed)
Lumbar Epidural Steroid Injection - Interlaminar Approach with Fluoroscopic Guidance  Patient: Jillian [name]      Date of Birth: [birth year] MRN: [id number] PCP: Pleas Koch, NP      Visit Date: 03/27/2021   Universal Protocol:     Consent Given By: the patient  Position: PRONE  Additional Comments: Vital signs were monitored before and after the procedure. Patient was prepped and draped in the usual sterile fashion. The correct patient, procedure, and site was verified.   Injection Procedure Details:   Procedure diagnoses: Lumbar radiculopathy [M54.16]   Meds Administered:  Meds ordered this encounter  Medications   betamethasone acetate-betamethasone sodium phosphate (CELESTONE) injection 12 mg     Laterality:  Midline  Location/Site:  L5-S1  Needle: 3.5 in., 20 ga. Tuohy  Needle Placement: Paramedian epidural  Findings:   -Comments: Excellent flow of contrast into the epidural space.  Procedure Details: Using a paramedian approach from the side mentioned above, the region overlying the inferior lamina was localized under fluoroscopic visualization and the soft tissues overlying this structure were infiltrated with 4 ml. of 1% Lidocaine without Epinephrine. The Tuohy needle was inserted into the epidural space using a paramedian approach.   The epidural space was localized using loss of resistance along with counter oblique bi-planar fluoroscopic views.  After negative aspirate for air, blood, and CSF, a 2 ml. volume of Isovue-250 was injected into the epidural space and the flow of contrast was observed. Radiographs were obtained for documentation purposes.    The injectate was administered into the level noted above.   Additional Comments:  The patient tolerated the procedure well Dressing: 2 x 2 sterile gauze and Band-Aid    Post-procedure details: Patient was observed during the procedure. Post-procedure instructions were reviewed.  Patient  left the clinic in stable condition.

## 2021-04-03 ENCOUNTER — Encounter (INDEPENDENT_AMBULATORY_CARE_PROVIDER_SITE_OTHER): Payer: Self-pay | Admitting: Family Medicine

## 2021-04-03 ENCOUNTER — Other Ambulatory Visit: Payer: Self-pay

## 2021-04-03 ENCOUNTER — Ambulatory Visit (INDEPENDENT_AMBULATORY_CARE_PROVIDER_SITE_OTHER): Payer: Medicare Other | Admitting: Family Medicine

## 2021-04-03 VITALS — BP 137/80 | HR 71 | Temp 98.1°F | Ht 66.0 in | Wt 209.0 lb

## 2021-04-03 DIAGNOSIS — R0602 Shortness of breath: Secondary | ICD-10-CM | POA: Diagnosis not present

## 2021-04-03 DIAGNOSIS — E7849 Other hyperlipidemia: Secondary | ICD-10-CM | POA: Diagnosis not present

## 2021-04-03 DIAGNOSIS — R7303 Prediabetes: Secondary | ICD-10-CM

## 2021-04-03 DIAGNOSIS — Z6834 Body mass index (BMI) 34.0-34.9, adult: Secondary | ICD-10-CM

## 2021-04-03 DIAGNOSIS — E559 Vitamin D deficiency, unspecified: Secondary | ICD-10-CM

## 2021-04-03 DIAGNOSIS — E669 Obesity, unspecified: Secondary | ICD-10-CM

## 2021-04-04 ENCOUNTER — Ambulatory Visit (INDEPENDENT_AMBULATORY_CARE_PROVIDER_SITE_OTHER): Payer: Medicare Other | Admitting: Family Medicine

## 2021-04-04 LAB — HEMOGLOBIN A1C
Est. average glucose Bld gHb Est-mCnc: 117 mg/dL
Hgb A1c MFr Bld: 5.7 % — ABNORMAL HIGH (ref 4.8–5.6)

## 2021-04-04 LAB — CMP14+EGFR
ALT: 25 IU/L (ref 0–32)
AST: 25 IU/L (ref 0–40)
Albumin/Globulin Ratio: 1.7 (ref 1.2–2.2)
Albumin: 4.5 g/dL (ref 3.8–4.8)
Alkaline Phosphatase: 81 IU/L (ref 44–121)
BUN/Creatinine Ratio: 22 (ref 12–28)
BUN: 19 mg/dL (ref 8–27)
Bilirubin Total: 0.3 mg/dL (ref 0.0–1.2)
CO2: 22 mmol/L (ref 20–29)
Calcium: 9.5 mg/dL (ref 8.7–10.3)
Chloride: 106 mmol/L (ref 96–106)
Creatinine, Ser: 0.85 mg/dL (ref 0.57–1.00)
Globulin, Total: 2.7 g/dL (ref 1.5–4.5)
Glucose: 92 mg/dL (ref 65–99)
Potassium: 4.5 mmol/L (ref 3.5–5.2)
Sodium: 142 mmol/L (ref 134–144)
Total Protein: 7.2 g/dL (ref 6.0–8.5)
eGFR: 76 mL/min/{1.73_m2} (ref >59)

## 2021-04-04 LAB — LIPID PANEL WITH LDL/HDL RATIO
Cholesterol, Total: 193 mg/dL (ref 100–199)
HDL: 82 mg/dL (ref >39)
LDL Chol Calc (NIH): 103 mg/dL — ABNORMAL HIGH (ref 0–99)
LDL/HDL Ratio: 1.3 ratio (ref 0.0–3.2)
Triglycerides: 42 mg/dL (ref 0–149)
VLDL Cholesterol Cal: 8 mg/dL (ref 5–40)

## 2021-04-04 LAB — INSULIN, RANDOM: INSULIN: 12.6 u[IU]/mL (ref 2.6–24.9)

## 2021-04-04 LAB — VITAMIN D 25 HYDROXY (VIT D DEFICIENCY, FRACTURES): Vit D, 25-Hydroxy: 63.3 ng/mL (ref 30.0–100.0)

## 2021-04-04 NOTE — Progress Notes (Signed)
Chief Complaint:   OBESITY Jillian [name] is here to discuss her progress with her obesity treatment plan along with follow-up of her obesity related diagnoses. [name] is on keeping a food journal and adhering to recommended goals of 1300-1600 calories and 85+ grams of protein daily and states she is following her eating plan approximately 75% of the time. [name] states she is walking and gardening for 30 minutes 3 times per week.  Today's visit was #: 6 Starting weight: 223 lbs Starting date: 12/29/2019 Today's weight: 209 lbs Today's date: 04/03/2021 Total lbs lost to date: 14 Total lbs lost since last in-office visit: 5  Interim History: Presley has done very well with weight loss since she has been journaling. She finds this helps keep her mindful and she is doing reasonably well meeting her calorie and protein goals overall.  Subjective:   1. SOB (shortness of breath) on exertion [name] notes an improvement in shortness of breath with activity. Her IC today shows her RMR and VO2 within normal limits.  2. Pre-diabetes [name] is working on diet and exercise, and she is due for labs.  3. Other hyperlipidemia [name] is working on decreasing cholesterol in her diet. She is due for labs.  4. Vitamin D deficiency [name] is on Vit D, and she is due for labs. She denies nausea, vomiting, or muscle weakness.  Assessment/Plan:   1. SOB (shortness of breath) on exertion [name] will continue with exercise and weight loss, and will continue to follow up as directed.  2. Pre-diabetes [name] will continue to work on weight loss, exercise, and decreasing simple carbohydrates to help decrease the risk of diabetes. We will check labs today.  - CMP14+EGFR - Insulin, random - Hemoglobin A1c  3. Other hyperlipidemia Cardiovascular risk and specific lipid/LDL goals reviewed.  We discussed several lifestyle modifications today. We will check labs today. [name] will continue to work on  diet, exercise and weight loss efforts. Orders and follow up as documented in patient record.   - Lipid Panel With LDL/HDL Ratio  4. Vitamin D deficiency Low Vitamin D level contributes to fatigue and are associated with obesity, breast, and colon cancer. We will check labs today. [name] will follow-up for routine testing of Vitamin D, at least 2-3 times per year to avoid over-replacement.  - VITAMIN D 25 Hydroxy (Vit-D Deficiency, Fractures)  5. Obesity with current BMI 33.8 [name] is currently in the action stage of change. As such, her goal is to continue with weight loss efforts. She has agreed to keeping a food journal and adhering to recommended goals of 1300-1600 calories and 85+ grams of protein daily.   Exercise goals: As is.  Behavioral modification strategies: increasing lean protein intake and meal planning and cooking strategies.  [name] has agreed to follow-up with our clinic in 3 weeks. She was informed of the importance of frequent follow-up visits to maximize her success with intensive lifestyle modifications for her multiple health conditions.   [name] was informed we would discuss her lab results at her next visit unless there is a critical issue that needs to be addressed sooner. [name] agreed to keep her next visit at the agreed upon time to discuss these results.  Objective:   Blood pressure 137/80, pulse 71, temperature 98.1 F (36.7 C), height $RemoveBe'5\' 6"'cWgUqQQQs$  (1.676 m), weight 209 lb (94.8 kg), SpO2 96 %. Body mass index is 33.73 kg/m.  General: Cooperative, alert, well developed, in no acute distress. HEENT: Conjunctivae and lids unremarkable.  Cardiovascular: Regular rhythm.  Lungs: Normal work of breathing. Neurologic: No focal deficits.   Lab Results  Component Value Date   CREATININE 0.85 04/03/2021   BUN 19 04/03/2021   NA 142 04/03/2021   K 4.5 04/03/2021   CL 106 04/03/2021   CO2 22 04/03/2021   Lab Results  Component Value Date   ALT 25  04/03/2021   AST 25 04/03/2021   ALKPHOS 81 04/03/2021   BILITOT 0.3 04/03/2021   Lab Results  Component Value Date   HGBA1C 5.7 (H) 04/03/2021   HGBA1C 5.9 (H) 12/28/2020   HGBA1C 6.0 07/11/2020   HGBA1C 5.5 05/12/2019   HGBA1C 5.6 10/27/2018   Lab Results  Component Value Date   INSULIN 12.6 04/03/2021   INSULIN 11.3 12/28/2020   INSULIN 10.8 05/12/2019   INSULIN 7.4 10/27/2018   INSULIN 8.5 06/12/2018   Lab Results  Component Value Date   TSH 2.000 12/28/2020   Lab Results  Component Value Date   CHOL 193 04/03/2021   HDL 82 04/03/2021   LDLCALC 103 (H) 04/03/2021   LDLDIRECT 158.6 08/18/2013   TRIG 42 04/03/2021   CHOLHDL 2 07/11/2020   Lab Results  Component Value Date   VD25OH 63.3 04/03/2021   VD25OH 54.9 12/28/2020   VD25OH 46.51 07/11/2020   Lab Results  Component Value Date   WBC 4.9 12/28/2020   HGB 13.8 12/28/2020   HCT 44.2 12/28/2020   MCV 88 12/28/2020   PLT 257.0 07/11/2020   No results found for: IRON, TIBC, FERRITIN  Attestation Statements:   Reviewed by clinician on day of visit: allergies, medications, problem list, medical history, surgical history, family history, social history, and previous encounter notes.  Time spent on visit including pre-visit chart review and post-visit care and charting was 48 minutes.    I, Trixie Dredge, am acting as transcriptionist for Dennard Nip, MD.  I have reviewed the above documentation for accuracy and completeness, and I agree with the above. -  Dennard Nip, MD

## 2021-04-25 ENCOUNTER — Ambulatory Visit (INDEPENDENT_AMBULATORY_CARE_PROVIDER_SITE_OTHER): Payer: Medicare Other | Admitting: Family Medicine

## 2021-04-26 ENCOUNTER — Ambulatory Visit (INDEPENDENT_AMBULATORY_CARE_PROVIDER_SITE_OTHER): Payer: Medicare Other | Admitting: Family Medicine

## 2021-04-26 ENCOUNTER — Other Ambulatory Visit: Payer: Self-pay

## 2021-04-26 ENCOUNTER — Encounter (INDEPENDENT_AMBULATORY_CARE_PROVIDER_SITE_OTHER): Payer: Self-pay | Admitting: Family Medicine

## 2021-04-26 VITALS — BP 139/81 | HR 94 | Temp 97.3°F | Ht 66.0 in | Wt 209.0 lb

## 2021-04-26 DIAGNOSIS — E7849 Other hyperlipidemia: Secondary | ICD-10-CM | POA: Diagnosis not present

## 2021-04-26 DIAGNOSIS — E559 Vitamin D deficiency, unspecified: Secondary | ICD-10-CM

## 2021-04-26 DIAGNOSIS — E669 Obesity, unspecified: Secondary | ICD-10-CM | POA: Diagnosis not present

## 2021-04-26 DIAGNOSIS — Z6834 Body mass index (BMI) 34.0-34.9, adult: Secondary | ICD-10-CM

## 2021-04-26 DIAGNOSIS — R7303 Prediabetes: Secondary | ICD-10-CM | POA: Diagnosis not present

## 2021-04-27 NOTE — Progress Notes (Signed)
Chief Complaint:   OBESITY Jillian [name] is here to discuss her progress with her obesity treatment plan along with follow-up of her obesity related diagnoses. [name] is on keeping a food journal and adhering to recommended goals of 1300-1600 calories and 85+ grams of protein daily and states she is following her eating plan approximately 75% of the time. Jillian [name] states she is active while doing yard work (a little).  Today's visit was #: 7 Starting weight: 223 lbs Starting date: 12/29/2019 Today's weight: 209 lbs Today's date: 04/26/2021 Total lbs lost to date: 14 Total lbs lost since last in-office visit: 0  Interim History: Melise is journaling which helped a lot for her to see if she is meeting her goals or not with calories and protein per day. Many times she I under on calories or other times under in protein. She is here to review labs as well.  Subjective:   1. Pre-diabetes Jillian [name] denies carbohydrate cravings or concerns with medicine. She is taking metformin currently. Her last CMP improved, and A1c improved from 5.9 to 5.7. I discussed labs with the patient today.  2. Other hyperlipidemia Lashondria is taking Lipitor, and she is tolerating medication(s) well without side effects.  Medication compliance is good and patient appears to be taking it as prescribed. Denies additional concerns regarding this condition. Her labs have little to no change from prior. Her last LDL was 103 no change. I discussed labs with the patient today.  3. Vitamin D deficiency Jillian [name] is currently taking OTC vitamin D 2,000 IU each day. Last Vit D level was at goal. She denies nausea, vomiting or muscle weakness. I discussed labs with the patient today.  Assessment/Plan:   1. Pre-diabetes Jillian [name] will continue her prudent nutritional plan, decrease simple carbohydrates, increase protein, and she will continue metformin at the same dose.  - I reiterated and again counseled patient on pathophysiology  of the disease process of Pre-DM.   - Stressed importance of dietary and lifestyle modifications resulting in weight loss as first line txmnt - in addition we discussed the risks and benefits of various medication options which can help [place] in the management of this disease process as well as with weight loss.  Will consider starting one of these meds in future as will focus on prudent nutritional plan at this time.  - continue to decrease simple carbs; increase fiber and proteins -> follow meal plan  - handouts provided at pt's request after education provided.  All concerns/questions addressed.   - anticipatory guidance given.   - Recheck A1c and fasting insulin level in approximately 3 months from last check or as deemed fit.   2. Other hyperlipidemia Jillian [name] will decrease saturated and trans fats, and increase exercise as tolerated.   Su [place] reports compliance with meds and/or treatment plan such as low saturated and trans fat low cholesterol meal plan - Rec: aerobic activity with eventual goal of a minimum of 150+ min wk plus 2 days/ week of resistance strength training - Cardiovascular risk and specific lipid/LDL goals reviewed.  We discussed several lifestyle modifications today and Zuliana will continue to work on diet, exercise and weight loss efforts.  - Will continue routine screening as patient continues with health goals and weight loss journey  3. Vitamin D deficiency Jillian [name] will continue OTC Vit D 2,000 IU daily. - Again, I discussed importance of vitamin D to their health and well-being.  - possible symptoms of low Vitamin D can be  low energy, depressed mood, muscle aches, joint aches, osteoporosis etc. - low Vitamin D levels may be linked to an increased risk of cardiovascular events and even increased risk of cancers- such as colon and breast.  - I recommend pt take a OTC Vit D 2,000 IU q daily. - Informed patient this may be a lifelong thing, and she was  encouraged to continue to take the medicine until told otherwise.   - we will need to monitor levels regularly (every 3-4 mo on average) to keep levels within normal limits.  - weight loss will likely improve availability of vitamin D, thus encouraged Jillian [name] to continue with meal plan and their weight loss efforts to further improve this condition  4. Obesity with current BMI of 33.7 Jillian [name] is currently in the action stage of change. As such, her goal is to continue with weight loss efforts. She has agreed to keeping a food journal and adhering to recommended goals of 1300-1600 calories and 85+ grams of protein daily.   I reviewed food log with the patient and I advised ways to improve adherence to the meal plan.  Exercise goals: May walk a little each day if she would like.  Behavioral modification strategies: increasing lean protein intake and keeping a strict food journal.  Ameah has agreed to follow-up with our clinic in 2 to 3 weeks. She was informed of the importance of frequent follow-up visits to maximize her success with intensive lifestyle modifications for her multiple health conditions.    Objective:   Blood pressure 139/81, pulse 94, temperature (!) 97.3 F (36.3 C), height '5\' 6"'$  (1.676 m), weight 209 lb (94.8 kg), SpO2 99 %. Body mass index is 33.73 kg/m.  General: Cooperative, alert, well developed, in no acute distress. HEENT: Conjunctivae and lids unremarkable. Cardiovascular: Regular rhythm.  Lungs: Normal work of breathing. Neurologic: No focal deficits.   Lab Results  Component Value Date   CREATININE 0.85 04/03/2021   BUN 19 04/03/2021   NA 142 04/03/2021   K 4.5 04/03/2021   CL 106 04/03/2021   CO2 22 04/03/2021   Lab Results  Component Value Date   ALT 25 04/03/2021   AST 25 04/03/2021   ALKPHOS 81 04/03/2021   BILITOT 0.3 04/03/2021   Lab Results  Component Value Date   HGBA1C 5.7 (H) 04/03/2021   HGBA1C 5.9 (H) 12/28/2020   HGBA1C 6.0  07/11/2020   HGBA1C 5.5 05/12/2019   HGBA1C 5.6 10/27/2018   Lab Results  Component Value Date   INSULIN 12.6 04/03/2021   INSULIN 11.3 12/28/2020   INSULIN 10.8 05/12/2019   INSULIN 7.4 10/27/2018   INSULIN 8.5 06/12/2018   Lab Results  Component Value Date   TSH 2.000 12/28/2020   Lab Results  Component Value Date   CHOL 193 04/03/2021   HDL 82 04/03/2021   LDLCALC 103 (H) 04/03/2021   LDLDIRECT 158.6 08/18/2013   TRIG 42 04/03/2021   CHOLHDL 2 07/11/2020   Lab Results  Component Value Date   VD25OH 63.3 04/03/2021   VD25OH 54.9 12/28/2020   VD25OH 46.51 07/11/2020   Lab Results  Component Value Date   WBC 4.9 12/28/2020   HGB 13.8 12/28/2020   HCT 44.2 12/28/2020   MCV 88 12/28/2020   PLT 257.0 07/11/2020   No results found for: IRON, TIBC, FERRITIN   Obesity Behavioral Intervention:   Approximately 15 minutes were spent on the discussion below.  ASK: We discussed the diagnosis of obesity with Lilliauna today  and Sheriden agreed to give [place] permission to discuss obesity behavioral modification therapy today.  ASSESS: Mehek has the diagnosis of obesity and her BMI today is 33.7. Dua is in the action stage of change.   ADVISE: Beyza was educated on the multiple health risks of obesity as well as the benefit of weight loss to improve her health. She was advised of the need for long term treatment and the importance of lifestyle modifications to improve her current health and to decrease her risk of future health problems.  AGREE: Multiple dietary modification options and treatment options were discussed and Novel agreed to follow the recommendations documented in the above note.  ARRANGE: Mayim was educated on the importance of frequent visits to treat obesity as outlined per CMS and USPSTF guidelines and agreed to schedule her next follow up appointment today.  Attestation Statements:   Reviewed by clinician on day of visit: allergies,  medications, problem list, medical history, surgical history, family history, social history, and previous encounter notes.   Wilhemena Durie, am acting as transcriptionist for [employer], DO.  I have reviewed the above documentation for accuracy and completeness, and I agree with the above. Marjory Sneddon, D.O.  The [place] was signed into law in 2016 which includes the topic of electronic health records.  This provides immediate access to information in MyChart.  This includes consultation notes, operative notes, office notes, lab results and pathology reports.  If you have any questions about what you read please let us know at your next visit so we can discuss your concerns and take corrective action if need be.  We are right here with you.

## 2021-05-10 ENCOUNTER — Ambulatory Visit: Payer: Self-pay

## 2021-05-12 ENCOUNTER — Ambulatory Visit (INDEPENDENT_AMBULATORY_CARE_PROVIDER_SITE_OTHER): Payer: Medicare Other

## 2021-05-12 ENCOUNTER — Other Ambulatory Visit: Payer: Self-pay

## 2021-05-12 DIAGNOSIS — Z23 Encounter for immunization: Secondary | ICD-10-CM | POA: Diagnosis not present

## 2021-05-17 ENCOUNTER — Ambulatory Visit (INDEPENDENT_AMBULATORY_CARE_PROVIDER_SITE_OTHER): Payer: Medicare Other | Admitting: Family Medicine

## 2021-05-17 ENCOUNTER — Other Ambulatory Visit: Payer: Self-pay

## 2021-05-17 VITALS — BP 120/80 | HR 88 | Temp 98.4°F | Ht 66.0 in | Wt 208.0 lb

## 2021-05-17 DIAGNOSIS — E669 Obesity, unspecified: Secondary | ICD-10-CM

## 2021-05-17 DIAGNOSIS — R7303 Prediabetes: Secondary | ICD-10-CM | POA: Diagnosis not present

## 2021-05-17 DIAGNOSIS — Z6834 Body mass index (BMI) 34.0-34.9, adult: Secondary | ICD-10-CM | POA: Diagnosis not present

## 2021-05-17 NOTE — Progress Notes (Signed)
Chief Complaint:   OBESITY Jillian [name] is here to discuss her progress with her obesity treatment plan along with follow-up of her obesity related diagnoses. Jillian [name] is on keeping a food journal and adhering to recommended goals of 1300-1600 calories and 85+ grams of protein daily and states she is following her eating plan approximately 75-80% of the time. Jillian [name] states she is walking and doing yard work for 30+ minutes 2-3 times per week.  Today's visit was #: 8 Starting weight: 223 lbs Starting date: 12/29/2019 Today's weight: 208 lbs Today's date: 05/17/2021 Total lbs lost to date: 15 Total lbs lost since last in-office visit: 1  Interim History: Jillian [name] continues do well with weight loss. She is journaling regularly and doing well meeting and exceeding her protein goals. Her hunger is controlled.  Subjective:   1. Pre-diabetes Jillian [name] is stable on metformin. She is doing very well with diet and weight loss.  Assessment/Plan:   1. Pre-diabetes Jillian [name] will continue metformin, weight loss, and decreasing simple carbohydrates to help decrease the risk of diabetes. We will recheck labs before the end of the year.  2. Obesity with current BMI of 33.6 Jillian [name] is currently in the action stage of change. As such, her goal is to continue with weight loss efforts. She has agreed to keeping a food journal and adhering to recommended goals of 1300-1600 calories and 85+ grams of protein daily.   Exercise goals: As is.  Behavioral modification strategies: increasing lean protein intake and keeping a strict food journal.  Jillian [name] has agreed to follow-up with our clinic in 2 to 3 weeks. She was informed of the importance of frequent follow-up visits to maximize her success with intensive lifestyle modifications for her multiple health conditions.   Objective:   Blood pressure 120/80, pulse 88, temperature 98.4 F (36.9 C), height 5\' 6"  (1.676 m), weight 208 lb (94.3 kg), SpO2 98 %. Body  mass index is 33.57 kg/m.  General: Cooperative, alert, well developed, in no acute distress. HEENT: Conjunctivae and lids unremarkable. Cardiovascular: Regular rhythm.  Lungs: Normal work of breathing. Neurologic: No focal deficits.   Lab Results  Component Value Date   CREATININE 0.85 04/03/2021   BUN 19 04/03/2021   NA 142 04/03/2021   K 4.5 04/03/2021   CL 106 04/03/2021   CO2 22 04/03/2021   Lab Results  Component Value Date   ALT 25 04/03/2021   AST 25 04/03/2021   ALKPHOS 81 04/03/2021   BILITOT 0.3 04/03/2021   Lab Results  Component Value Date   HGBA1C 5.7 (H) 04/03/2021   HGBA1C 5.9 (H) 12/28/2020   HGBA1C 6.0 07/11/2020   HGBA1C 5.5 05/12/2019   HGBA1C 5.6 10/27/2018   Lab Results  Component Value Date   INSULIN 12.6 04/03/2021   INSULIN 11.3 12/28/2020   INSULIN 10.8 05/12/2019   INSULIN 7.4 10/27/2018   INSULIN 8.5 06/12/2018   Lab Results  Component Value Date   TSH 2.000 12/28/2020   Lab Results  Component Value Date   CHOL 193 04/03/2021   HDL 82 04/03/2021   LDLCALC 103 (H) 04/03/2021   LDLDIRECT 158.6 08/18/2013   TRIG 42 04/03/2021   CHOLHDL 2 07/11/2020   Lab Results  Component Value Date   VD25OH 63.3 04/03/2021   VD25OH 54.9 12/28/2020   VD25OH 46.51 07/11/2020   Lab Results  Component Value Date   WBC 4.9 12/28/2020   HGB 13.8 12/28/2020   HCT 44.2 12/28/2020   MCV 88 12/28/2020  PLT 257.0 07/11/2020   No results found for: IRON, TIBC, FERRITIN  Attestation Statements:   Reviewed by clinician on day of visit: allergies, medications, problem list, medical history, surgical history, family history, social history, and previous encounter notes.  Time spent on visit including pre-visit chart review and post-visit care and charting was 30 minutes.    I, Trixie Dredge, am acting as transcriptionist for Dennard Nip, MD.  I have reviewed the above documentation for accuracy and completeness, and I agree with the above. -   Dennard Nip, MD

## 2021-05-22 HISTORY — PX: CATARACT EXTRACTION W/ INTRAOCULAR LENS IMPLANT: SHX1309

## 2021-06-07 ENCOUNTER — Encounter (INDEPENDENT_AMBULATORY_CARE_PROVIDER_SITE_OTHER): Payer: Self-pay | Admitting: Family Medicine

## 2021-06-07 ENCOUNTER — Ambulatory Visit (INDEPENDENT_AMBULATORY_CARE_PROVIDER_SITE_OTHER): Payer: Medicare Other | Admitting: Family Medicine

## 2021-06-07 ENCOUNTER — Other Ambulatory Visit: Payer: Self-pay

## 2021-06-07 VITALS — BP 138/81 | HR 85 | Temp 97.7°F | Ht 66.0 in | Wt 205.0 lb

## 2021-06-07 DIAGNOSIS — Z6834 Body mass index (BMI) 34.0-34.9, adult: Secondary | ICD-10-CM

## 2021-06-07 DIAGNOSIS — E669 Obesity, unspecified: Secondary | ICD-10-CM | POA: Diagnosis not present

## 2021-06-07 DIAGNOSIS — F3289 Other specified depressive episodes: Secondary | ICD-10-CM

## 2021-06-07 NOTE — Progress Notes (Signed)
Chief Complaint:   OBESITY Jillian [name] is here to discuss her progress with her obesity treatment plan along with follow-up of her obesity related diagnoses. Jillian [name] is on keeping a food journal and adhering to recommended goals of 1300-1600 calories and 85+ grams of protein daily and states she is following her eating plan approximately 75% of the time. Jillian [name] states she is doing 0 minutes 0 times per week.  Today's visit was #: 9 Starting weight: 223 lbs Starting date: 12/29/2019 Today's weight: 205 lbs Today's date: 06/07/2021 Total lbs lost to date: 18 Total lbs lost since last in-office visit: 3  Interim History: Jillian [name] continues to do well with weight loss. She is walking for exercise, but she is avoiding strengthening until she is cleared after cataract surgery. She is journaling well.  Subjective:   1. Other depression with emotional eating Jillian [name] is stable on Wellbutrin. She notes decreased emotional eating and decreased cravings. She is satisfied with healthier eating.  Assessment/Plan:   1. Other depression with emotional eating Emotional eating behavior strategies were discussed today to help Jillian [name] deal with her emotional/non-hunger eating behaviors. Jillian [name] will continue Wellbutrin as is, and will continue to follow up as directed. Orders and follow up as documented in patient record.   2. Obesity with current BMI of 33.2 Jillian [name] is currently in the action stage of change. As such, her goal is to continue with weight loss efforts. She has agreed to keeping a food journal and adhering to recommended goals of 1300-1600 calories and 85 grams of protein daily.   Behavioral modification strategies: increasing lean protein intake and keeping a strict food journal.  Jillian [name] has agreed to follow-up with our clinic in 3 weeks. She was informed of the importance of frequent follow-up visits to maximize her success with intensive lifestyle modifications for her multiple health  conditions.   Objective:   Blood pressure 138/81, pulse 85, temperature 97.7 F (36.5 C), height 5\' 6"  (1.676 m), weight 205 lb (93 kg), SpO2 97 %. Body mass index is 33.09 kg/m.  General: Cooperative, alert, well developed, in no acute distress. HEENT: Conjunctivae and lids unremarkable. Cardiovascular: Regular rhythm.  Lungs: Normal work of breathing. Neurologic: No focal deficits.   Lab Results  Component Value Date   CREATININE 0.85 04/03/2021   BUN 19 04/03/2021   NA 142 04/03/2021   K 4.5 04/03/2021   CL 106 04/03/2021   CO2 22 04/03/2021   Lab Results  Component Value Date   ALT 25 04/03/2021   AST 25 04/03/2021   ALKPHOS 81 04/03/2021   BILITOT 0.3 04/03/2021   Lab Results  Component Value Date   HGBA1C 5.7 (H) 04/03/2021   HGBA1C 5.9 (H) 12/28/2020   HGBA1C 6.0 07/11/2020   HGBA1C 5.5 05/12/2019   HGBA1C 5.6 10/27/2018   Lab Results  Component Value Date   INSULIN 12.6 04/03/2021   INSULIN 11.3 12/28/2020   INSULIN 10.8 05/12/2019   INSULIN 7.4 10/27/2018   INSULIN 8.5 06/12/2018   Lab Results  Component Value Date   TSH 2.000 12/28/2020   Lab Results  Component Value Date   CHOL 193 04/03/2021   HDL 82 04/03/2021   LDLCALC 103 (H) 04/03/2021   LDLDIRECT 158.6 08/18/2013   TRIG 42 04/03/2021   CHOLHDL 2 07/11/2020   Lab Results  Component Value Date   VD25OH 63.3 04/03/2021   VD25OH 54.9 12/28/2020   VD25OH 46.51 07/11/2020   Lab Results  Component Value Date  WBC 4.9 12/28/2020   HGB 13.8 12/28/2020   HCT 44.2 12/28/2020   MCV 88 12/28/2020   PLT 257.0 07/11/2020   No results found for: IRON, TIBC, FERRITIN  Attestation Statements:   Reviewed by clinician on day of visit: allergies, medications, problem list, medical history, surgical history, family history, social history, and previous encounter notes.  Time spent on visit including pre-visit chart review and post-visit care and charting was 25 minutes.    I, Trixie Dredge, am acting as transcriptionist for Dennard Nip, MD.  I have reviewed the above documentation for accuracy and completeness, and I agree with the above. -  Dennard Nip, MD

## 2021-06-22 ENCOUNTER — Encounter (INDEPENDENT_AMBULATORY_CARE_PROVIDER_SITE_OTHER): Payer: Self-pay | Admitting: Family Medicine

## 2021-06-22 ENCOUNTER — Ambulatory Visit (INDEPENDENT_AMBULATORY_CARE_PROVIDER_SITE_OTHER): Payer: Medicare Other | Admitting: Family Medicine

## 2021-06-22 ENCOUNTER — Other Ambulatory Visit: Payer: Self-pay

## 2021-06-22 VITALS — BP 133/86 | HR 89 | Temp 98.2°F | Ht 66.0 in | Wt 203.0 lb

## 2021-06-22 DIAGNOSIS — Z6836 Body mass index (BMI) 36.0-36.9, adult: Secondary | ICD-10-CM | POA: Diagnosis not present

## 2021-06-22 DIAGNOSIS — R7303 Prediabetes: Secondary | ICD-10-CM

## 2021-06-22 NOTE — Progress Notes (Signed)
Chief Complaint:   OBESITY Stefanny is here to discuss her progress with her obesity treatment plan along with follow-up of her obesity related diagnoses. Birdella is on keeping a food journal and adhering to recommended goals of 1300-1600 calories and 85 grams of protein daily and states she is following her eating plan approximately 75% of the time. Pippa states she is walking for 30 minutes 2-3 times per week.  Today's visit was #: 10 Starting weight: 223 lbs Starting date: 12/29/2019 Today's weight: 203 lbs Today's date: 06/22/2021 Total lbs lost to date: 20 Total lbs lost since last in-office visit: 2  Interim History: Kannon continues to do well with weight loss. She is working on increasing her protein. She feels her hunger is controlled, and she is doing well with meal planning.  Subjective:   1. Pre-diabetes Monzerat is stable on metformin, and she denies nausea, vomiting, or hypoglycemia. She notes decreased polyphagia and she is doing very well with diet and exercise.  Assessment/Plan:   1. Pre-diabetes Kasia will continue metformin, and we will continue to manage her medications and labs. She will continue with diet, exercise, and decreasing simple carbohydrates to help decrease the risk of diabetes.   2. Obesity with current BMI of 32.8 Yazmine is currently in the action stage of change. As such, her goal is to continue with weight loss efforts. She has agreed to keeping a food journal and adhering to recommended goals of 1300-1600 calories and 85+ grams of protein daily.   Exercise goals: As is.  Behavioral modification strategies: increasing lean protein intake and holiday eating strategies .  Patrice has agreed to follow-up with our clinic in 3 weeks. She was informed of the importance of frequent follow-up visits to maximize her success with intensive lifestyle modifications for her multiple health conditions.   Objective:   Blood pressure 133/86, pulse  89, temperature 98.2 F (36.8 C), height 5\' 6"  (1.676 m), weight 203 lb (92.1 kg), SpO2 96 %. Body mass index is 32.77 kg/m.  General: Cooperative, alert, well developed, in no acute distress. HEENT: Conjunctivae and lids unremarkable. Cardiovascular: Regular rhythm.  Lungs: Normal work of breathing. Neurologic: No focal deficits.   Lab Results  Component Value Date   CREATININE 0.85 04/03/2021   BUN 19 04/03/2021   NA 142 04/03/2021   K 4.5 04/03/2021   CL 106 04/03/2021   CO2 22 04/03/2021   Lab Results  Component Value Date   ALT 25 04/03/2021   AST 25 04/03/2021   ALKPHOS 81 04/03/2021   BILITOT 0.3 04/03/2021   Lab Results  Component Value Date   HGBA1C 5.7 (H) 04/03/2021   HGBA1C 5.9 (H) 12/28/2020   HGBA1C 6.0 07/11/2020   HGBA1C 5.5 05/12/2019   HGBA1C 5.6 10/27/2018   Lab Results  Component Value Date   INSULIN 12.6 04/03/2021   INSULIN 11.3 12/28/2020   INSULIN 10.8 05/12/2019   INSULIN 7.4 10/27/2018   INSULIN 8.5 06/12/2018   Lab Results  Component Value Date   TSH 2.000 12/28/2020   Lab Results  Component Value Date   CHOL 193 04/03/2021   HDL 82 04/03/2021   LDLCALC 103 (H) 04/03/2021   LDLDIRECT 158.6 08/18/2013   TRIG 42 04/03/2021   CHOLHDL 2 07/11/2020   Lab Results  Component Value Date   VD25OH 63.3 04/03/2021   VD25OH 54.9 12/28/2020   VD25OH 46.51 07/11/2020   Lab Results  Component Value Date   WBC 4.9 12/28/2020  HGB 13.8 12/28/2020   HCT 44.2 12/28/2020   MCV 88 12/28/2020   PLT 257.0 07/11/2020   No results found for: IRON, TIBC, FERRITIN  Attestation Statements:   Reviewed by clinician on day of visit: allergies, medications, problem list, medical history, surgical history, family history, social history, and previous encounter notes.  Time spent on visit including pre-visit chart review and post-visit care and charting was 30 minutes.    I, Trixie Dredge, am acting as transcriptionist for Dennard Nip,  MD.  I have reviewed the above documentation for accuracy and completeness, and I agree with the above. -  Dennard Nip, MD

## 2021-07-07 LAB — HM MAMMOGRAPHY

## 2021-07-10 ENCOUNTER — Encounter: Payer: Self-pay | Admitting: Primary Care

## 2021-07-18 ENCOUNTER — Ambulatory Visit (INDEPENDENT_AMBULATORY_CARE_PROVIDER_SITE_OTHER): Payer: Medicare Other | Admitting: Family Medicine

## 2021-07-18 ENCOUNTER — Other Ambulatory Visit: Payer: Self-pay

## 2021-07-18 ENCOUNTER — Encounter (INDEPENDENT_AMBULATORY_CARE_PROVIDER_SITE_OTHER): Payer: Self-pay | Admitting: Family Medicine

## 2021-07-18 VITALS — BP 132/83 | HR 76 | Temp 98.1°F | Ht 66.0 in | Wt 205.0 lb

## 2021-07-18 DIAGNOSIS — E785 Hyperlipidemia, unspecified: Secondary | ICD-10-CM

## 2021-07-18 DIAGNOSIS — R7303 Prediabetes: Secondary | ICD-10-CM | POA: Diagnosis not present

## 2021-07-18 DIAGNOSIS — Z6836 Body mass index (BMI) 36.0-36.9, adult: Secondary | ICD-10-CM

## 2021-07-18 DIAGNOSIS — E559 Vitamin D deficiency, unspecified: Secondary | ICD-10-CM

## 2021-07-18 DIAGNOSIS — E538 Deficiency of other specified B group vitamins: Secondary | ICD-10-CM

## 2021-07-18 NOTE — Progress Notes (Signed)
Chief Complaint:   OBESITY Tani is here to discuss her progress with her obesity treatment plan along with follow-up of her obesity related diagnoses. Nusayba is on keeping a food journal and adhering to recommended goals of 1300-1600 calories and 85+ grams of protein daily and states she is following her eating plan approximately 50% of the time. Shernita states she is doing 0 minutes 0 times per week.  Today's visit was #: 11 Starting weight: 223 lbs Starting date: 12/29/2019 Today's weight: 205 lbs Today's date: 07/18/2021 Total lbs lost to date: 18 Total lbs lost since last in-office visit: 0  Interim History: Zoraida did some celebration eating over Thanksgiving, but has already gotten back on track. She is working on finding new recipes so she can eat healthy, but also keep her family happy.  Subjective:   1. Pre-diabetes Keiri is stable on metformin, and she is doing well with decreasing simple carbohydrates.   2. Hyperlipidemia, unspecified hyperlipidemia type Mardy is on Lipitor, and she is working on diet and exercise. She denies chest pain or myalgias.   3. B12 deficiency Kaytee is at high risk of B12 deficiency on metformin.   4. Vitamin D deficiency Sherilynn is on Vit D OTC, and she is due to have labs checked.   Assessment/Plan:   1. Pre-diabetes Ocia will continue metformin, and will continue to work on weight loss, exercise, and decreasing simple carbohydrates to help decrease the risk of diabetes. We will check labs today.  - CBC with Differential/Platelet - CMP14+EGFR - Insulin, random - Hemoglobin A1c  2. Hyperlipidemia, unspecified hyperlipidemia type Cardiovascular risk and specific lipid/LDL goals reviewed. We discussed several lifestyle modifications today. We will check labs today. Amaia will continue to work on diet, exercise and weight loss efforts. Orders and follow up as documented in patient record.   - Lipid Panel With LDL/HDL  Ratio - TSH  3. B12 deficiency The diagnosis was reviewed with the patient. We will check labs today, and will follow up at Rylynn's next visit. Orders and follow up as documented in patient record.  - Vitamin B12  4. Vitamin D deficiency Low Vitamin D level contributes to fatigue and are associated with obesity, breast, and colon cancer. We will check labs today. Alexy will follow-up for routine testing of Vitamin D, at least 2-3 times per year to avoid over-replacement.  - VITAMIN D 25 Hydroxy (Vit-D Deficiency, Fractures)  5. Obesity BMI today is 32 Fanny is currently in the action stage of change. As such, her goal is to continue with weight loss efforts. She has agreed to keeping a food journal and adhering to recommended goals of 1300-1600 calories and 85+ grams of protein daily.   Recipe options with increased protein and reasonable calories were discussed.  Behavioral modification strategies: increasing lean protein intake, decreasing simple carbohydrates, meal planning and cooking strategies, and holiday eating strategies .  Ronneisha has agreed to follow-up with our clinic in 3 weeks. She was informed of the importance of frequent follow-up visits to maximize her success with intensive lifestyle modifications for her multiple health conditions.   Paolina was informed we would discuss her lab results at her next visit unless there is a critical issue that needs to be addressed sooner. Yuritzi agreed to keep her next visit at the agreed upon time to discuss these results.  Objective:   Blood pressure 132/83, pulse 76, temperature 98.1 F (36.7 C), height _0  (1.676 m), weight 205 lb (93  kg), SpO2 98 %. Body mass index is 33.09 kg/m.  General: Cooperative, alert, well developed, in no acute distress. HEENT: Conjunctivae and lids unremarkable. Cardiovascular: Regular rhythm.  Lungs: Normal work of breathing. Neurologic: No focal deficits.   Lab Results  Component  Value Date   CREATININE 0.85 04/03/2021   BUN 19 04/03/2021   NA 142 04/03/2021   K 4.5 04/03/2021   CL 106 04/03/2021   CO2 22 04/03/2021   Lab Results  Component Value Date   ALT 25 04/03/2021   AST 25 04/03/2021   ALKPHOS 81 04/03/2021   BILITOT 0.3 04/03/2021   Lab Results  Component Value Date   HGBA1C 5.7 (H) 04/03/2021   HGBA1C 5.9 (H) 12/28/2020   HGBA1C 6.0 07/11/2020   HGBA1C 5.5 05/12/2019   HGBA1C 5.6 10/27/2018   Lab Results  Component Value Date   INSULIN 12.6 04/03/2021   INSULIN 11.3 12/28/2020   INSULIN 10.8 05/12/2019   INSULIN 7.4 10/27/2018   INSULIN 8.5 06/12/2018   Lab Results  Component Value Date   TSH 2.000 12/28/2020   Lab Results  Component Value Date   CHOL 193 04/03/2021   HDL 82 04/03/2021   LDLCALC 103 (H) 04/03/2021   LDLDIRECT 158.6 08/18/2013   TRIG 42 04/03/2021   CHOLHDL 2 07/11/2020   Lab Results  Component Value Date   VD25OH 63.3 04/03/2021   VD25OH 54.9 12/28/2020   VD25OH 46.51 07/11/2020   Lab Results  Component Value Date   WBC 4.9 12/28/2020   HGB 13.8 12/28/2020   HCT 44.2 12/28/2020   MCV 88 12/28/2020   PLT 257.0 07/11/2020   No results found for: IRON, TIBC, FERRITIN  Obesity Behavioral Intervention:   Approximately 15 minutes were spent on the discussion below.  ASK: We discussed the diagnosis of obesity with Whittley today and Linnae agreed to give [place] permission to discuss obesity behavioral modification therapy today.  ASSESS: Lakethia has the diagnosis of obesity and her BMI today is 33.2. Kemia is in the action stage of change.   ADVISE: Nava was educated on the multiple health risks of obesity as well as the benefit of weight loss to improve her health. She was advised of the need for long term treatment and the importance of lifestyle modifications to improve her current health and to decrease her risk of future health problems.  AGREE: Multiple dietary modification options and  treatment options were discussed and Chandni agreed to follow the recommendations documented in the above note.  ARRANGE: Luccia was educated on the importance of frequent visits to treat obesity as outlined per CMS and USPSTF guidelines and agreed to schedule her next follow up appointment today.  Attestation Statements:   Reviewed by clinician on day of visit: allergies, medications, problem list, medical history, surgical history, family history, social history, and previous encounter notes.   I, Trixie Dredge, am acting as transcriptionist for Dennard Nip, MD.  I have reviewed the above documentation for accuracy and completeness, and I agree with the above. -  Dennard Nip, MD

## 2021-07-19 LAB — CMP14+EGFR
ALT: 26 IU/L (ref 0–32)
AST: 27 IU/L (ref 0–40)
Albumin/Globulin Ratio: 1.5 (ref 1.2–2.2)
Albumin: 4.6 g/dL (ref 3.8–4.8)
Alkaline Phosphatase: 98 IU/L (ref 44–121)
BUN/Creatinine Ratio: 18 (ref 12–28)
BUN: 17 mg/dL (ref 8–27)
Bilirubin Total: 0.3 mg/dL (ref 0.0–1.2)
CO2: 23 mmol/L (ref 20–29)
Calcium: 9.5 mg/dL (ref 8.7–10.3)
Chloride: 101 mmol/L (ref 96–106)
Creatinine, Ser: 0.95 mg/dL (ref 0.57–1.00)
Globulin, Total: 3.1 g/dL (ref 1.5–4.5)
Glucose: 92 mg/dL (ref 70–99)
Potassium: 4.4 mmol/L (ref 3.5–5.2)
Sodium: 141 mmol/L (ref 134–144)
Total Protein: 7.7 g/dL (ref 6.0–8.5)
eGFR: 66 mL/min/{1.73_m2} (ref >59)

## 2021-07-19 LAB — CBC WITH DIFFERENTIAL/PLATELET
Basophils Absolute: 0 10*3/uL (ref 0.0–0.2)
Basos: 1 %
EOS (ABSOLUTE): 0.2 10*3/uL (ref 0.0–0.4)
Eos: 4 %
Hematocrit: 44.4 % (ref 34.0–46.6)
Hemoglobin: 14 g/dL (ref 11.1–15.9)
Immature Grans (Abs): 0 10*3/uL (ref 0.0–0.1)
Immature Granulocytes: 0 %
Lymphocytes Absolute: 1.8 10*3/uL (ref 0.7–3.1)
Lymphs: 36 %
MCH: 27.5 pg (ref 26.6–33.0)
MCHC: 31.5 g/dL (ref 31.5–35.7)
MCV: 87 fL (ref 79–97)
Monocytes Absolute: 0.4 10*3/uL (ref 0.1–0.9)
Monocytes: 9 %
Neutrophils Absolute: 2.5 10*3/uL (ref 1.4–7.0)
Neutrophils: 50 %
Platelets: 263 10*3/uL (ref 150–450)
RBC: 5.09 x10E6/uL (ref 3.77–5.28)
RDW: 13 % (ref 11.7–15.4)
WBC: 5 10*3/uL (ref 3.4–10.8)

## 2021-07-19 LAB — LIPID PANEL WITH LDL/HDL RATIO
Cholesterol, Total: 218 mg/dL — ABNORMAL HIGH (ref 100–199)
HDL: 90 mg/dL (ref >39)
LDL Chol Calc (NIH): 120 mg/dL — ABNORMAL HIGH (ref 0–99)
LDL/HDL Ratio: 1.3 ratio (ref 0.0–3.2)
Triglycerides: 45 mg/dL (ref 0–149)
VLDL Cholesterol Cal: 8 mg/dL (ref 5–40)

## 2021-07-19 LAB — HEMOGLOBIN A1C
Est. average glucose Bld gHb Est-mCnc: 114 mg/dL
Hgb A1c MFr Bld: 5.6 % (ref 4.8–5.6)

## 2021-07-19 LAB — VITAMIN B12: Vitamin B-12: 1887 pg/mL — ABNORMAL HIGH (ref 232–1245)

## 2021-07-19 LAB — VITAMIN D 25 HYDROXY (VIT D DEFICIENCY, FRACTURES): Vit D, 25-Hydroxy: 49.9 ng/mL (ref 30.0–100.0)

## 2021-07-19 LAB — INSULIN, RANDOM: INSULIN: 10.9 u[IU]/mL (ref 2.6–24.9)

## 2021-07-19 LAB — TSH: TSH: 2.67 u[IU]/mL (ref 0.450–4.500)

## 2021-07-24 NOTE — Progress Notes (Signed)
Subjective:   Jillian [name] is a 65 y.o. female who presents for an Initial Medicare Annual Wellness Visit.  I connected with Addilee Neu today by telephone and verified that I am speaking with the correct person using two identifiers. Location patient: home Location provider: work Persons participating in the virtual visit: patient, Marine scientist.    I discussed the limitations, risks, security and privacy concerns of performing an evaluation and management service by telephone and the availability of in person appointments. I also discussed with the patient that there may be a patient responsible charge related to this service. The patient expressed understanding and verbally consented to this telephonic visit.    Interactive audio and video telecommunications were attempted between this provider and patient, however failed, due to patient having technical difficulties OR patient did not have access to video capability.  We continued and completed visit with audio only.  Some vital signs may be absent or patient reported.   Time Spent with patient on telephone encounter: 25 minutes  Review of Systems     Cardiac Risk Factors include: advanced age (>66men, >109 women);dyslipidemia     Objective:    Today's Vitals   07/26/21 0857  Weight: 205 lb (93 kg)  Height: 5\' 6"  (1.676 m)   Body mass index is 33.09 kg/m.  Advanced Directives 07/26/2021 11/06/2019 07/26/2017  Does Patient Have a Medical Advance Directive? Yes No No  Type of Paramedic of [place];Living will - -  Does patient want to make changes to medical advance directive? Yes (MAU/Ambulatory/Procedural Areas - Information given) - -  Would patient like information on creating a medical advance directive? - Yes (MAU/Ambulatory/Procedural Areas - Information given) No - Patient declined    Current Medications (verified) Outpatient Encounter Medications as of 07/26/2021  Medication Sig    Ascorbic Acid (VITAMIN C) 1000 MG tablet Take 1,000 mg by mouth daily.   aspirin EC 81 MG tablet Take 81 mg by mouth daily.   atorvastatin (LIPITOR) 20 MG tablet Take 1 tablet (20 mg total) by mouth every evening. for cholesterol   buPROPion (WELLBUTRIN SR) 200 MG 12 hr tablet TAKE ONE TABLET BY MOUTH EVERY DAY FOR DEPRESSION   CALCIUM-MAGNESIUM-ZINC PO Take 1 capsule by mouth daily.   cetirizine (ZYRTEC) 10 MG tablet Take 1 tablet (10 mg total) by mouth daily as needed.   Coenzyme Q10 (CO Q 10) 100 MG CAPS Take 1 capsule by mouth daily.   fluocinonide-emollient (LIDEX-E) 0.05 % cream APPLY  CREAM EXTERNALLY TWICE DAILY TO  AFFECTED  AREA   fluticasone (FLONASE) 50 MCG/ACT nasal spray Place 1 spray into both nostrils daily as needed.   gabapentin (NEURONTIN) 100 MG capsule Take 1-3 capsules (100-300 mg total) by mouth at bedtime.   GARLIC PO Take 485 mg by mouth daily.   Hydroquinone 8 % EMUL Apply topically.   meloxicam (MOBIC) 15 MG tablet Take 1 tablet (15 mg total) by mouth daily. With food.   metFORMIN (GLUCOPHAGE) 500 MG tablet Take 1 tablet (500 mg total) by mouth daily with breakfast. For blood sugar.   Multiple Vitamins-Minerals (ALIVE ONCE DAILY WOMENS 50+) TABS Take 1 tablet by mouth daily.   NONFORMULARY OR COMPOUNDED ITEM Apply topically. Hydroquinone 8% compound cream sent by Dermatology to [facility]   Omega-3 1000 MG CAPS Take 1 capsule by mouth daily.   Probiotic Product (PROBIOTIC-10) CAPS Take 1 capsule by mouth daily.   Turmeric Curcumin 500 MG CAPS Take 1 capsule  by mouth daily.   vitamin E 400 UNIT capsule Take 400 Units by mouth daily.   No facility-administered encounter medications on file as of 07/26/2021.    Allergies (verified) Patient has no known allergies.   History: Past Medical History:  Diagnosis Date   Allergic rhinitis    Allergy    Arthritis    Arthritis of low back    Back pain    Cataract    Cataracts, bilateral    Constipation     Depression    Dry skin    on hands   Fibroid uterus    Hyperlipidemia    Joint pain    Osteoarthritis of right shoulder    Pre-diabetes    Ringing in ears    Sciatica    sciatica   Vitamin D deficiency    Past Surgical History:  Procedure Laterality Date   ABDOMINAL HYSTERECTOMY     broken ankle     CARPAL TUNNEL RELEASE Bilateral    CATARACT EXTRACTION W/ INTRAOCULAR LENS IMPLANT Bilateral 05/22/2021   2nd eye done 07/03/21   SHOULDER SURGERY Right    Family History  Problem Relation Age of Onset   Colon cancer Sister    Colon polyps Sister    Diabetes Mother        father and 9 siblings   Kidney disease Mother        renal failure   Hypertension Mother    Hyperlipidemia Mother    Stroke Mother    Depression Mother    Anxiety disorder Mother    Obesity Mother    Heart failure Mother    Heart disease Father        paternal grandmother   Obesity Father    Diabetes Father    Hypertension Father    Hyperlipidemia Father    Eating disorder Father    Esophageal cancer Neg Hx    Rectal cancer Neg Hx    Stomach cancer Neg Hx    Social History   Socioeconomic History   Marital status: Married    Spouse name: [name]   Number of children: 0   Years of education: Not on file   Highest education level: Not on file  Occupational History   Occupation: Retired [place] [employer]  Tobacco Use   Smoking status: Former    Packs/day: 1.00    Years: 29.00    Pack years: 29.00    Types: Cigarettes   Smokeless tobacco: Never   Tobacco comments:    30 years  Vaping Use   Vaping Use: Never used  Substance and Sexual Activity   Alcohol use: No   Drug use: No   Sexual activity: Not on file  Other Topics Concern   Not on file  Social History Narrative   Not on file   Social Determinants of Health   Financial Resource Strain: Low Risk    Difficulty of Paying Living Expenses: Not hard at all  Food Insecurity: No Food Insecurity   Worried About Ship broker in the Last Year: Never true   [place] in the Last Year: Never true  Transportation Needs: No Transportation Needs   Lack of Transportation (Medical): No   Lack of Transportation (Non-Medical): No  Physical Activity: Insufficiently Active   Days of Exercise per Week: 3 days   Minutes of Exercise per Session: 30 min  Stress: No Stress Concern Present   Feeling of Stress : Not at  all  Social Connections: Engineer, building services of Communication with Friends and Family: More than three times a week   Frequency of Social Gatherings with Friends and Family: Once a week   Attends Religious Services: More than 4 times per year   Active Member of [employer] or Organizations: Yes   Attends Music therapist: More than 4 times per year   Marital Status: Married    Tobacco Counseling Counseling given: Not Answered Tobacco comments: 30 years   Clinical Intake:  Pre-visit preparation completed: Yes  Pain : No/denies pain     BMI - recorded: 33.1 Nutritional Status: BMI > 30  Obese Nutritional Risks: None Diabetes: No  How often do you need to have someone help you when you read instructions, pamphlets, or other written materials from your doctor or pharmacy?: 1 - Never  Diabetic?No  Interpreter Needed?: No  Information entered by :: Orrin Brigham LPN   Activities of Daily Living In your present state of health, do you have any difficulty performing the following activities: 07/26/2021  Hearing? N  Vision? N  Difficulty concentrating or making decisions? N  Walking or climbing stairs? N  Dressing or bathing? N  Doing errands, shopping? N  Preparing Food and eating ? N  Using the Toilet? N  In the past six months, have you accidently leaked urine? N  Do you have problems with loss of bowel control? N  Managing your Medications? N  Managing your Finances? N  Housekeeping or managing your Housekeeping? N  Some recent data might be hidden     Patient Care Team: Pleas Koch, NP as PCP - General (Internal Medicine) Jannet Mantis, MD as Consulting Physician (Dermatology) Macarthur Critchley, OD as Referring Physician (Optometry)  Indicate any recent Medical Services you may have received from other than [facility] providers in the past year (date may be approximate).     Assessment:   This is a routine wellness examination for Kriya.  Hearing/Vision screen Hearing Screening - Comments:: No issues Vision Screening - Comments:: Last exam 06/2021, Dr. Rolanda Jay , bilateral cataract done  Dietary issues and exercise activities discussed: Current Exercise Habits: Home exercise routine, Type of exercise: strength training/weights;treadmill;walking, Time (Minutes): 30, Frequency (Times/Week): 3, Weekly Exercise (Minutes/Week): 90, Intensity: Mild   Goals Addressed             This Visit's Progress    Patient Stated       Would like to maintain current health- eating healthier, drinking more water and exercising.        Depression Screen PHQ 2/9 Scores 07/26/2021 12/28/2020 07/20/2020 06/12/2018 05/28/2017 09/20/2015  PHQ - 2 Score 0 2 0 2 0 0  PHQ- 9 Score - 4 3 9  0 -    Fall Risk Fall Risk  07/26/2021 07/20/2020 05/28/2017  Falls in the past year? 0 0 No  Number falls in past yr: 0 0 -  Injury with Fall? 0 0 -  Risk for fall due to : No Fall Risks - -  Follow up Falls prevention discussed - -    FALL RISK PREVENTION PERTAINING TO THE HOME:  Any stairs in or around the home? Yes  If so, are there any without handrails? No  Home free of loose throw rugs in walkways, pet beds, electrical cords, etc? Yes  Adequate lighting in your home to reduce risk of falls? Yes   ASSISTIVE DEVICES UTILIZED TO PREVENT FALLS:  Life alert? No  Use  of a cane, walker or w/c? No  Grab bars in the bathroom? Yes  Shower chair or bench in shower? No  Elevated toilet seat or a handicapped toilet? Yes   TIMED UP AND GO:  Was the test  performed? No , visit completed over the phone.    Cognitive Function:    Normal cognitive status assessed by this Nurse Health Advisor. No abnormalities found.      Immunizations Immunization History  Administered Date(s) Administered   Fluad Quad(high Dose 65+) 05/12/2021, 05/12/2021   Influenza Split 07/16/2011, 05/28/2012   Influenza,inj,Quad PF,6+ Mos 06/02/2013, 05/26/2014, 06/23/2015, 05/25/2016, 05/28/2017, 06/02/2018, 06/08/2019, 05/28/2020   PFIZER(Purple Top)SARS-COV-2 Vaccination 11/19/2019, 12/14/2019, 07/02/2020, 12/19/2020   Pfizer Covid-19 Vaccine Bivalent Booster 2yrs & up 06/11/2021   Td 10/07/1996, 03/01/2009   Tdap 06/08/2019   Zoster Recombinat (Shingrix) 09/09/2018, 11/16/2018   Zoster, Live 11/10/2013    TDAP status: Up to date  Flu Vaccine status: Up to date  Pneumococcal vaccine status: Up to date  Covid-19 vaccine status: Completed vaccines  Qualifies for Shingles Vaccine? Yes   Zostavax completed Yes   Shingrix Completed?: Yes  Screening Tests Health Maintenance  Topic Date Due   HIV Screening  Never done   Pneumonia Vaccine 79+ Years old (1 - PCV) Never done   DEXA SCAN  Never done   MAMMOGRAM  07/07/2022   COLONOSCOPY (Pts 45-12yrs Insurance coverage will need to be confirmed)  06/19/2023   TETANUS/TDAP  06/07/2029   INFLUENZA VACCINE  Completed   COVID-19 Vaccine  Completed   Hepatitis C Screening  Completed   Zoster Vaccines- Shingrix  Completed   HPV VACCINES  Aged Out    Health Maintenance  Health Maintenance Due  Topic Date Due   HIV Screening  Never done   Pneumonia Vaccine 68+ Years old (1 - PCV) Never done   DEXA SCAN  Never done    Colorectal cancer screening: Type of screening: Colonoscopy. Completed 06/18/18. Repeat every 5 years  Mammogram status: Completed 07/07/21. Repeat every year  Bone Density status: Ordered 07/26/21. Pt provided with contact info and advised to call to schedule appt.  Lung Cancer  Screening: (Low Dose CT Chest recommended if Age 6-80 years, 30 pack-year currently smoking OR have quit w/in 15years.) does not qualify.     Additional Screening:  Hepatitis C Screening: does qualify; Completed 05/21/17  Vision Screening: Recommended annual ophthalmology exams for early detection of glaucoma and other disorders of the eye. Is the patient up to date with their annual eye exam?  Yes  Who is the provider or what is the name of the office in which the patient attends annual eye exams? Dr. Rolanda Jay   Dental Screening: Recommended annual dental exams for proper oral hygiene  Community Resource Referral / Chronic Care Management: CRR required this visit?  No   CCM required this visit?  No      Plan:     I have personally reviewed and noted the following in the patient's chart:   Medical and social history Use of alcohol, tobacco or illicit drugs  Current medications and supplements including opioid prescriptions. Patient is not currently taking opioid prescriptions. Functional ability and status Nutritional status Physical activity Advanced directives List of other physicians Hospitalizations, surgeries, and ER visits in previous 12 months Vitals Screenings to include cognitive, depression, and falls Referrals and appointments  In addition, I have reviewed and discussed with patient certain preventive protocols, quality metrics, and best practice recommendations. A written  personalized care plan for preventive services as well as general preventive health recommendations were provided to patient.   Due to this being a telephonic visit, the after visit summary with patients personalized plan was offered to patient via mail or my-chart. Patient would like to access on my-chart.     Loma Messing, LPN   [id number]   Nurse Health Advisor  Nurse Notes: none

## 2021-07-25 ENCOUNTER — Other Ambulatory Visit

## 2021-07-26 ENCOUNTER — Ambulatory Visit (INDEPENDENT_AMBULATORY_CARE_PROVIDER_SITE_OTHER): Payer: Medicare Other

## 2021-07-26 VITALS — Ht 66.0 in | Wt 205.0 lb

## 2021-07-26 DIAGNOSIS — Z0000 Encounter for general adult medical examination without abnormal findings: Secondary | ICD-10-CM | POA: Diagnosis not present

## 2021-07-26 DIAGNOSIS — Z780 Asymptomatic menopausal state: Secondary | ICD-10-CM | POA: Diagnosis not present

## 2021-07-26 NOTE — Patient Instructions (Addendum)
Jillian [name] , Thank you for taking time to complete your Medicare Wellness Visit. I appreciate your ongoing commitment to your health goals. Please review the following plan we discussed and let me know if I can assist you in the future.   Screening recommendations/referrals: Colonoscopy: up to date, completed 06/18/18, due 06/19/23 Mammogram: up to date, completed 07/07/21, due 07/07/22 Bone Density: due, ordered today, someone will call to schedule your appointment Recommended yearly ophthalmology/optometry visit for glaucoma screening and checkup Recommended yearly dental visit for hygiene and checkup  Vaccinations: Influenza vaccine: up to date Pneumococcal vaccine: due Prevnar 13, discuss with PCP at your next visit Tdap vaccine: up to date, completed 07/09/19, due 07/08/29 Shingles vaccine: up to date    Covid-19:up to date  Advanced directives: Please bring a copy of Living Will and/or [place] for your chart.   Conditions/risks identified: see problem list  Next appointment: Follow up in one year for your annual wellness visit 07/31/22 @ 9:00am, this will be a telephone visit    Preventive Care 65 Years and Older, Female Preventive care refers to lifestyle choices and visits with your health care provider that can promote health and wellness. What does preventive care include? A yearly physical exam. This is also called an annual well check. Dental exams once or twice a year. Routine eye exams. Ask your health care provider how often you should have your eyes checked. Personal lifestyle choices, including: Daily care of your teeth and gums. Regular physical activity. Eating a healthy diet. Avoiding tobacco and drug use. Limiting alcohol use. Practicing safe sex. Taking low-dose aspirin every day. Taking vitamin and mineral supplements as recommended by your health care provider. What happens during an annual well check? The services and  screenings done by your health care provider during your annual well check will depend on your age, overall health, lifestyle risk factors, and family history of disease. Counseling  Your health care provider may ask you questions about your: Alcohol use. Tobacco use. Drug use. Emotional well-being. Home and relationship well-being. Sexual activity. Eating habits. History of falls. Memory and ability to understand (cognition). Work and work Statistician. Reproductive health. Screening  You may have the following tests or measurements: Height, weight, and BMI. Blood pressure. Lipid and cholesterol levels. These may be checked every 5 years, or more frequently if you are over 89 years old. Skin check. Lung cancer screening. You may have this screening every year starting at age 86 if you have a 30-pack-year history of smoking and currently smoke or have quit within the past 15 years. Fecal occult blood test (FOBT) of the stool. You may have this test every year starting at age 62. Flexible sigmoidoscopy or colonoscopy. You may have a sigmoidoscopy every 5 years or a colonoscopy every 10 years starting at age 78. Hepatitis C blood test. Hepatitis B blood test. Sexually transmitted disease (STD) testing. Diabetes screening. This is done by checking your blood sugar (glucose) after you have not eaten for a while (fasting). You may have this done every 1-3 years. Bone density scan. This is done to screen for osteoporosis. You may have this done starting at age 5. Mammogram. This may be done every 1-2 years. Talk to your health care provider about how often you should have regular mammograms. Talk with your health care provider about your test results, treatment options, and if necessary, the need for more tests. Vaccines  Your health care provider may recommend certain vaccines, such as:  Influenza vaccine. This is recommended every year. Tetanus, diphtheria, and acellular pertussis (Tdap,  Td) vaccine. You may need a Td booster every 10 years. Zoster vaccine. You may need this after age 70. Pneumococcal 13-valent conjugate (PCV13) vaccine. One dose is recommended after age 25. Pneumococcal polysaccharide (PPSV23) vaccine. One dose is recommended after age 49. Talk to your health care provider about which screenings and vaccines you need and how often you need them. This information is not intended to replace advice given to you by your health care provider. Make sure you discuss any questions you have with your health care provider. Document Released: 09/02/2015 Document Revised: 04/25/2016 Document Reviewed: 06/07/2015 Elsevier Interactive Patient Education  2017 [place] Prevention in the Home Falls can cause injuries. They can happen to people of all ages. There are many things you can do to make your home safe and to help prevent falls. What can I do on the outside of my home? Regularly fix the edges of walkways and driveways and fix any cracks. Remove anything that might make you trip as you walk through a door, such as a raised step or threshold. Trim any bushes or trees on the path to your home. Use bright outdoor lighting. Clear any walking paths of anything that might make someone trip, such as rocks or tools. Regularly check to see if handrails are loose or broken. Make sure that both sides of any steps have handrails. Any raised decks and porches should have guardrails on the edges. Have any leaves, snow, or ice cleared regularly. Use sand or salt on walking paths during winter. Clean up any spills in your garage right away. This includes oil or grease spills. What can I do in the bathroom? Use night lights. Install grab bars by the toilet and in the tub and shower. Do not use towel bars as grab bars. Use non-skid mats or decals in the tub or shower. If you need to sit down in the shower, use a plastic, non-slip stool. Keep the floor dry. Clean up any  water that spills on the floor as soon as it happens. Remove soap buildup in the tub or shower regularly. Attach bath mats securely with double-sided non-slip rug tape. Do not have throw rugs and other things on the floor that can make you trip. What can I do in the bedroom? Use night lights. Make sure that you have a light by your bed that is easy to reach. Do not use any sheets or blankets that are too big for your bed. They should not hang down onto the floor. Have a firm chair that has side arms. You can use this for support while you get dressed. Do not have throw rugs and other things on the floor that can make you trip. What can I do in the kitchen? Clean up any spills right away. Avoid walking on wet floors. Keep items that you use a lot in easy-to-reach places. If you need to reach something above you, use a strong step stool that has a grab bar. Keep electrical cords out of the way. Do not use floor polish or wax that makes floors slippery. If you must use wax, use non-skid floor wax. Do not have throw rugs and other things on the floor that can make you trip. What can I do with my stairs? Do not leave any items on the stairs. Make sure that there are handrails on both sides of the stairs and use them.  Fix handrails that are broken or loose. Make sure that handrails are as long as the stairways. Check any carpeting to make sure that it is firmly attached to the stairs. Fix any carpet that is loose or worn. Avoid having throw rugs at the top or bottom of the stairs. If you do have throw rugs, attach them to the floor with carpet tape. Make sure that you have a light switch at the top of the stairs and the bottom of the stairs. If you do not have them, ask someone to add them for you. What else can I do to help prevent falls? Wear shoes that: Do not have high heels. Have rubber bottoms. Are comfortable and fit you well. Are closed at the toe. Do not wear sandals. If you use a  stepladder: Make sure that it is fully opened. Do not climb a closed stepladder. Make sure that both sides of the stepladder are locked into place. Ask someone to hold it for you, if possible. Clearly mark and make sure that you can see: Any grab bars or handrails. First and last steps. Where the edge of each step is. Use tools that help you move around (mobility aids) if they are needed. These include: Canes. Walkers. Scooters. Crutches. Turn on the lights when you go into a dark area. Replace any light bulbs as soon as they burn out. Set up your furniture so you have a clear path. Avoid moving your furniture around. If any of your floors are uneven, fix them. If there are any pets around you, be aware of where they are. Review your medicines with your doctor. Some medicines can make you feel dizzy. This can increase your chance of falling. Ask your doctor what other things that you can do to help prevent falls. This information is not intended to replace advice given to you by your health care provider. Make sure you discuss any questions you have with your health care provider. Document Released: 06/02/2009 Document Revised: 01/12/2016 Document Reviewed: 09/10/2014 Elsevier Interactive Patient Education  2017 [employer].

## 2021-08-01 ENCOUNTER — Ambulatory Visit (INDEPENDENT_AMBULATORY_CARE_PROVIDER_SITE_OTHER): Payer: Medicare Other | Admitting: Primary Care

## 2021-08-01 ENCOUNTER — Other Ambulatory Visit: Payer: Self-pay

## 2021-08-01 ENCOUNTER — Encounter: Payer: Self-pay | Admitting: Primary Care

## 2021-08-01 VITALS — BP 130/74 | HR 96 | Temp 97.6°F | Ht 66.0 in | Wt 211.0 lb

## 2021-08-01 DIAGNOSIS — Z23 Encounter for immunization: Secondary | ICD-10-CM

## 2021-08-01 DIAGNOSIS — L853 Xerosis cutis: Secondary | ICD-10-CM | POA: Diagnosis not present

## 2021-08-01 DIAGNOSIS — G8929 Other chronic pain: Secondary | ICD-10-CM

## 2021-08-01 DIAGNOSIS — F331 Major depressive disorder, recurrent, moderate: Secondary | ICD-10-CM | POA: Diagnosis not present

## 2021-08-01 DIAGNOSIS — E785 Hyperlipidemia, unspecified: Secondary | ICD-10-CM

## 2021-08-01 DIAGNOSIS — R7303 Prediabetes: Secondary | ICD-10-CM | POA: Diagnosis not present

## 2021-08-01 DIAGNOSIS — M545 Low back pain, unspecified: Secondary | ICD-10-CM

## 2021-08-01 MED ORDER — ATORVASTATIN CALCIUM 20 MG PO TABS: 20.0000 mg | ORAL_TABLET | Freq: Every evening | ORAL | 3 refills | Status: DC

## 2021-08-01 MED ORDER — METFORMIN HCL 500 MG PO TABS: 500.0000 mg | ORAL_TABLET | Freq: Every day | ORAL | 3 refills | Status: DC

## 2021-08-01 MED ORDER — BUPROPION HCL ER (SR) 200 MG PO TB12: ORAL_TABLET | ORAL | 3 refills | Status: DC

## 2021-08-01 NOTE — Patient Instructions (Signed)
[facility].  Consider taking a stool softener once daily to help with your bowel movements.  It was a pleasure to see you today!

## 2021-08-01 NOTE — Assessment & Plan Note (Signed)
Stable.  Continue stretching, PRN gabapentin 100 mg, Meloxicam 15 mg PRN.

## 2021-08-01 NOTE — Progress Notes (Deleted)
HPI: Jillian [name] presents today for [facility] Wellness Visit and for follow up of chronic medical conditions.   Past Medical History:  Diagnosis Date   Allergic rhinitis    Allergy    Arthritis    Arthritis of low back    Back pain    Cataract    Cataracts, bilateral    Constipation    Depression    Dry skin    on hands   Fibroid uterus    Hyperlipidemia    Joint pain    Osteoarthritis of right shoulder    Pre-diabetes    Ringing in ears    Sciatica    sciatica   Vitamin D deficiency     Current Outpatient Medications  Medication Sig Dispense Refill   Ascorbic Acid (VITAMIN C) 1000 MG tablet Take 1,000 mg by mouth daily.     aspirin EC 81 MG tablet Take 81 mg by mouth daily.     atorvastatin (LIPITOR) 20 MG tablet Take 1 tablet (20 mg total) by mouth every evening. for cholesterol 90 tablet 3   buPROPion (WELLBUTRIN SR) 200 MG 12 hr tablet TAKE ONE TABLET BY MOUTH EVERY DAY FOR DEPRESSION 90 tablet 3   CALCIUM-MAGNESIUM-ZINC PO Take 1 capsule by mouth daily.     cetirizine (ZYRTEC) 10 MG tablet Take 1 tablet (10 mg total) by mouth daily as needed. 90 tablet 1   Coenzyme Q10 (CO Q 10) 100 MG CAPS Take 1 capsule by mouth daily.     fluocinonide-emollient (LIDEX-E) 0.05 % cream APPLY  CREAM EXTERNALLY TWICE DAILY TO  AFFECTED  AREA 60 g 0   fluticasone (FLONASE) 50 MCG/ACT nasal spray Place 1 spray into both nostrils daily as needed. 16 g 2   gabapentin (NEURONTIN) 100 MG capsule Take 1-3 capsules (100-300 mg total) by mouth at bedtime. 60 capsule 1   GARLIC PO Take 856 mg by mouth daily.     Hydroquinone 8 % EMUL Apply topically.     meloxicam (MOBIC) 15 MG tablet Take 1 tablet (15 mg total) by mouth daily. With food. 30 tablet 1   metFORMIN (GLUCOPHAGE) 500 MG tablet Take 1 tablet (500 mg total) by mouth daily with breakfast. For blood sugar. 90 tablet 3   Multiple Vitamins-Minerals (ALIVE ONCE DAILY WOMENS 50+) TABS Take 1 tablet by mouth daily.      NONFORMULARY OR COMPOUNDED ITEM Apply topically. Hydroquinone 8% compound cream sent by Dermatology to [facility]     Omega-3 1000 MG CAPS Take 1 capsule by mouth daily.     Probiotic Product (PROBIOTIC-10) CAPS Take 1 capsule by mouth daily.     Turmeric Curcumin 500 MG CAPS Take 1 capsule by mouth daily.     vitamin E 400 UNIT capsule Take 400 Units by mouth daily.     No current facility-administered medications for this visit.    No Known Allergies  Family History  Problem Relation Age of Onset   Colon cancer Sister    Colon polyps Sister    Diabetes Mother        father and 9 siblings   Kidney disease Mother        renal failure   Hypertension Mother    Hyperlipidemia Mother    Stroke Mother    Depression Mother    Anxiety disorder Mother    Obesity Mother    Heart failure Mother    Heart disease Father  paternal grandmother   Obesity Father    Diabetes Father    Hypertension Father    Hyperlipidemia Father    Eating disorder Father    Esophageal cancer Neg Hx    Rectal cancer Neg Hx    Stomach cancer Neg Hx     Social History   Socioeconomic History   Marital status: Married    Spouse name: [name]   Number of children: 0   Years of education: Not on file   Highest education level: Not on file  Occupational History   Occupation: Retired [employer]  Tobacco Use   Smoking status: Former    Packs/day: 1.00    Years: 29.00    Pack years: 29.00    Types: Cigarettes   Smokeless tobacco: Never   Tobacco comments:    30 years  Vaping Use   Vaping Use: Never used  Substance and Sexual Activity   Alcohol use: No   Drug use: No   Sexual activity: Not on file  Other Topics Concern   Not on file  Social History Narrative   Not on file   Social Determinants of Health   Financial Resource Strain: Low Risk    Difficulty of Paying Living Expenses: Not hard at all  Food Insecurity: No Food Insecurity   Worried About Sales executive in the Last Year: Never true   [place] in the Last Year: Never true  Transportation Needs: No Transportation Needs   Lack of Transportation (Medical): No   Lack of Transportation (Non-Medical): No  Physical Activity: Insufficiently Active   Days of Exercise per Week: 3 days   Minutes of Exercise per Session: 30 min  Stress: No Stress Concern Present   Feeling of Stress : Not at all  Social Connections: Socially Integrated   Frequency of Communication with Friends and Family: More than three times a week   Frequency of Social Gatherings with Friends and Family: Once a week   Attends Religious Services: More than 4 times per year   Active Member of Clubs or Organizations: Yes   Attends Music therapist: More than 4 times per year   Marital Status: Married  Human resources officer Violence: Not At Risk   Fear of Current or Ex-Partner: No   Emotionally Abused: No   Physically Abused: No   Sexually Abused: No    Hospitiliaztions: None  Health Maintenance:    Vaccines:  Influenza: Completed this season   Tetanus: 2020  Pneumovax: Never completed  Prevnar: Due for 20  Zostavax/Shingrix: Zostavax in 2015, Shingrix in 2020   Imaging/Procedures:   Bone Density: Due  Colonoscopy: 2019, due 2024  Mammogram: November 2022   Other:   Eye Doctor: Regular follow up  Dental Exam:       Providers: Alma Friendly, PCP; Dr. Arlis Porta, Ophthalmology; Dr. Leafy Ro, [facility]   I have personally reviewed and have noted: 1. The patient's medical and social history 2. Their use of alcohol, tobacco or illicit drugs 3. Their current medications and supplements 4. The patient's functional ability including ADL's, fall risks, home safety risks and hearing or visual impairment. 5. Diet and physical activities 6. Evidence for depression or mood disorder  Subjective:   Review of Systems:   Constitutional: Denies fever, malaise, fatigue, headache or abrupt  weight changes.  HEENT: Denies eye pain, eye redness, ear pain, ringing in the ears, runny nose, nasal congestion, bloody nose, or sore throat. Respiratory:  Denies difficulty breathing, shortness of breath, cough with/without sputum production.   Cardiovascular: Denies chest pain, chest tightness, palpitations, or swelling in the hands or feet.  Gastrointestinal: Denies abdominal pain, bloating, constipation, diarrhea,  or blood in the stool.  GU: Denies urgency, frequency, dysuria, hematuria, odor or discharge. Musculoskeletal: Denies decrease in range of motion, difficulty with gait, muscle pain or joint pain and swelling.  Skin: Denies redness, rashes, lesions, or ulcercations.  Neurological: Denies dizziness, difficulty with memory, difficulty with speech, or problems with balance and coordination.  Psychiatric: Denies concerns for anxiety or depression.   No other specific complaints in a complete review of systems (except as listed in HPI above).  Objective:  PE:   There were no vitals taken for this visit. Wt Readings from Last 3 Encounters:  07/26/21 205 lb (93 kg)  07/18/21 205 lb (93 kg)  06/22/21 203 lb (92.1 kg)    General: Appears their stated age, well developed, well nourished in NAD. Skin: Warm, dry and intact. No rashes, lesions or ulcerations noted. HEENT: Head: normal shape and size; Eyes: sclera white, no icterus, conjunctiva pink, PERRLA and EOMs intact; Ears: Tm's gray and intact, normal light reflex; Nose: mucosa pink and moist, septum midline; Throat/Mouth: Teeth present, mucosa pink and moist, no exudate, lesions or ulcerations noted.  Neck: Normal range of motion. Neck supple, trachea midline. No massses, lumps or thyromegaly present.  Cardiovascular: Normal rate and rhythm. S1,S2 noted.  No murmur, rubs or gallops noted. No JVD or BLE edema. No carotid bruits noted. Pulmonary/Chest: Normal effort and positive vesicular breath sounds. No respiratory distress. No  wheezes, rales or ronchi noted.  Abdomen: Soft and nontender. Normal bowel sounds, no bruits noted. No distention or masses noted. Liver, spleen and kidneys non palpable. Musculoskeletal: Normal range of motion. No signs of joint swelling. No difficulty with gait.  Neurological: Alert and oriented. Cranial nerves II-XII intact. Coordination normal. +DTRs bilaterally. Psychiatric: Mood and affect normal. Behavior is normal. Judgment and thought content normal.   EKG:  BMET    Component Value Date/Time   NA 141 07/18/2021 0905   K 4.4 07/18/2021 0905   CL 101 07/18/2021 0905   CO2 23 07/18/2021 0905   GLUCOSE 92 07/18/2021 0905   GLUCOSE 94 07/11/2020 0834   BUN 17 07/18/2021 0905   CREATININE 0.95 07/18/2021 0905   CALCIUM 9.5 07/18/2021 0905   GFRNONAA 73 05/12/2019 0805   GFRAA 84 05/12/2019 0805    Lipid Panel     Component Value Date/Time   CHOL 218 (H) 07/18/2021 0905   TRIG 45 07/18/2021 0905   HDL 90 07/18/2021 0905   CHOLHDL 2 07/11/2020 0834   VLDL 9.2 07/11/2020 0834   LDLCALC 120 (H) 07/18/2021 0905    CBC    Component Value Date/Time   WBC 5.0 07/18/2021 0905   WBC 4.3 07/11/2020 0834   RBC 5.09 07/18/2021 0905   RBC 4.76 07/11/2020 0834   HGB 14.0 07/18/2021 0905   HCT 44.4 07/18/2021 0905   PLT 263 07/18/2021 0905   MCV 87 07/18/2021 0905   MCH 27.5 07/18/2021 0905   MCHC 31.5 07/18/2021 0905   MCHC 33.1 07/11/2020 0834   RDW 13.0 07/18/2021 0905   LYMPHSABS 1.8 07/18/2021 0905   MONOABS 0.3 05/21/2017 0920   EOSABS 0.2 07/18/2021 0905   BASOSABS 0.0 07/18/2021 0905    Hgb A1C Lab Results  Component Value Date   HGBA1C 5.6 07/18/2021      Assessment  and Plan:   Medicare Annual Wellness Visit:  Diet: Physical activity: Sedentary Depression/mood screen: Negative Hearing: Intact to whispered voice Visual acuity: Grossly normal, performs annual eye exam  ADLs: Capable Fall risk: None Home safety: Good Cognitive evaluation: Intact to  orientation, naming, recall and repetition EOL planning: Adv directives, full code/ I agree  Preventative Medicine:  Next appointment: 1 year or sooner if needed

## 2021-08-01 NOTE — Assessment & Plan Note (Signed)
Following with dermatology.  Continue PRN compounded cream PRN.

## 2021-08-01 NOTE — Addendum Note (Signed)
Addended by: Francella Solian on: 08/01/2021 11:01 AM   Modules accepted: Orders

## 2021-08-01 NOTE — Assessment & Plan Note (Signed)
Reviewed recent lipid panel from Dr. Leafy Ro, suspect LDL increase is secondary to her diet.  Continue atorvastatin 20 mg.  Refills provided.

## 2021-08-01 NOTE — Assessment & Plan Note (Signed)
Recent A1C reviewed from Dr. Leafy Ro and is within normal range!  Continue metformin 500 mg daily.  Refills provided.

## 2021-08-01 NOTE — Progress Notes (Signed)
Subjective:    Patient ID: Jillian [name], female    DOB: 27-Dec-1955, 65 y.o.   MRN: [id number]  HPI  Jillian [name] is a very pleasant 65 y.o. female who presents today for [place] [facility] and follow up of chronic conditions.   1) Prediabetes: Following with [facility], managed on metformin 500 mg daily. A1C of 5.6 in late November 2022.  2) MDD: Currently managed on bupropion SR 200 mg  for which she is taking once daily. Overall feels well managed on this regimen.   3) Chronic Back Pain: Currently prescribed gabapentin 100 mg (1-3 capsules) PRN, Meloxicam 15 mg daily PRN. Intermittent flares, overall feels well managed on PRN use of these medications and stretching for which she learned from PT.   4) Hyperlipidemia: Currently managed on atorvastatin 20 mg. Lipid panel completed in late November 2022 with LDL of 120. She is eating between 85 to 100 g of protein daily per wellness center.   BP Readings from Last 3 Encounters:  08/01/21 130/74  07/18/21 132/83  06/22/21 133/86   Wt Readings from Last 3 Encounters:  08/01/21 211 lb (95.7 kg)  07/26/21 205 lb (93 kg)  07/18/21 205 lb (93 kg)     Vaccines:  Influenza: Completed this season   Tetanus: 2020  Pneumovax: Never completed  Prevnar: Due for 20   Zostavax/Shingrix: Zostavax in 2015, Shingrix in 2020   Imaging/Procedures:   Bone Density: Due  Colonoscopy: 2019, due 2024  Mammogram: November 2022   Other:   Eye Doctor: Regular follow up  Dental Exam: Regular follow up    Review of Systems  Eyes:  Negative for visual disturbance.  Respiratory:  Negative for shortness of breath.   Cardiovascular:  Negative for chest pain.  Gastrointestinal:  Positive for constipation.       Firmer stools since on a high protein diet.  Musculoskeletal:  Positive for back pain.  Neurological:  Negative for dizziness and headaches.  Psychiatric/Behavioral:  The patient is not  nervous/anxious.         Past Medical History:  Diagnosis Date   Allergic rhinitis    Allergy    Arthritis    Arthritis of low back    Back pain    Cataract    Cataracts, bilateral    Constipation    Depression    Dry skin    on hands   Fibroid uterus    Hyperlipidemia    Joint pain    Osteoarthritis of right shoulder    Pre-diabetes    Ringing in ears    Sciatica    sciatica   Vitamin D deficiency     Social History   Socioeconomic History   Marital status: Married    Spouse name: Jillian [name]   Number of children: 0   Years of education: Not on file   Highest education level: Not on file  Occupational History   Occupation: Retired [place] [employer]  Tobacco Use   Smoking status: Former    Packs/day: 1.00    Years: 29.00    Pack years: 29.00    Types: Cigarettes   Smokeless tobacco: Never   Tobacco comments:    30 years  Vaping Use   Vaping Use: Never used  Substance and Sexual Activity   Alcohol use: No   Drug use: No   Sexual activity: Not on file  Other Topics Concern   Not on file  Social History  Narrative   Not on file   Social Determinants of Health   Financial Resource Strain: Low Risk    Difficulty of Paying Living Expenses: Not hard at all  Food Insecurity: No Food Insecurity   Worried About Charity fundraiser in the Last Year: Never true   [place] in the Last Year: Never true  Transportation Needs: No Transportation Needs   Lack of Transportation (Medical): No   Lack of Transportation (Non-Medical): No  Physical Activity: Insufficiently Active   Days of Exercise per Week: 3 days   Minutes of Exercise per Session: 30 min  Stress: No Stress Concern Present   Feeling of Stress : Not at all  Social Connections: Socially Integrated   Frequency of Communication with Friends and Family: More than three times a week   Frequency of Social Gatherings with Friends and Family: Once a week   Attends Religious Services: More than 4  times per year   Active Member of [employer] or Organizations: Yes   Attends Music therapist: More than 4 times per year   Marital Status: Married  Human resources officer Violence: Not At Risk   Fear of Current or Ex-Partner: No   Emotionally Abused: No   Physically Abused: No   Sexually Abused: No    Past Surgical History:  Procedure Laterality Date   ABDOMINAL HYSTERECTOMY     broken ankle     CARPAL TUNNEL RELEASE Bilateral    CATARACT EXTRACTION W/ INTRAOCULAR LENS IMPLANT Bilateral 05/22/2021   2nd eye done 07/03/21   SHOULDER SURGERY Right     Family History  Problem Relation Age of Onset   Colon cancer Sister    Colon polyps Sister    Diabetes Mother        father and 9 siblings   Kidney disease Mother        renal failure   Hypertension Mother    Hyperlipidemia Mother    Stroke Mother    Depression Mother    Anxiety disorder Mother    Obesity Mother    Heart failure Mother    Heart disease Father        paternal grandmother   Obesity Father    Diabetes Father    Hypertension Father    Hyperlipidemia Father    Eating disorder Father    Esophageal cancer Neg Hx    Rectal cancer Neg Hx    Stomach cancer Neg Hx     No Known Allergies  Current Outpatient Medications on File Prior to Visit  Medication Sig Dispense Refill   Ascorbic Acid (VITAMIN C) 1000 MG tablet Take 1,000 mg by mouth daily.     aspirin EC 81 MG tablet Take 81 mg by mouth daily.     atorvastatin (LIPITOR) 20 MG tablet Take 1 tablet (20 mg total) by mouth every evening. for cholesterol 90 tablet 3   buPROPion (WELLBUTRIN SR) 200 MG 12 hr tablet TAKE ONE TABLET BY MOUTH EVERY DAY FOR DEPRESSION 90 tablet 3   CALCIUM-MAGNESIUM-ZINC PO Take 1 capsule by mouth daily.     cetirizine (ZYRTEC) 10 MG tablet Take 1 tablet (10 mg total) by mouth daily as needed. 90 tablet 1   Coenzyme Q10 (CO Q 10) 100 MG CAPS Take 1 capsule by mouth daily.     fluocinonide-emollient (LIDEX-E) 0.05 % cream APPLY   CREAM EXTERNALLY TWICE DAILY TO  AFFECTED  AREA 60 g 0   fluticasone (FLONASE) 50 MCG/ACT nasal  spray Place 1 spray into both nostrils daily as needed. 16 g 2   GARLIC PO Take 570 mg by mouth daily.     Hydroquinone 8 % EMUL Apply topically.     meloxicam (MOBIC) 15 MG tablet Take 1 tablet (15 mg total) by mouth daily. With food. 30 tablet 1   metFORMIN (GLUCOPHAGE) 500 MG tablet Take 1 tablet (500 mg total) by mouth daily with breakfast. For blood sugar. 90 tablet 3   Multiple Vitamins-Minerals (ALIVE ONCE DAILY WOMENS 50+) TABS Take 1 tablet by mouth daily.     NONFORMULARY OR COMPOUNDED ITEM Apply topically. Hydroquinone 8% compound cream sent by Dermatology to [facility]     Omega-3 1000 MG CAPS Take 1 capsule by mouth daily.     Probiotic Product (PROBIOTIC-10) CAPS Take 1 capsule by mouth daily.     Turmeric Curcumin 500 MG CAPS Take 1 capsule by mouth daily.     vitamin E 400 UNIT capsule Take 400 Units by mouth daily.     gabapentin (NEURONTIN) 100 MG capsule Take 1-3 capsules (100-300 mg total) by mouth at bedtime. (Patient not taking: Reported on 08/01/2021) 60 capsule 1   No current facility-administered medications on file prior to visit.    BP 130/74    Pulse 96    Temp 97.6 F (36.4 C) (Temporal)    Ht 5\' 6"  (1.676 m)    Wt 211 lb (95.7 kg)    SpO2 95%    BMI 34.06 kg/m  Objective:   Physical Exam Eyes:     Extraocular Movements: Extraocular movements intact.  Cardiovascular:     Rate and Rhythm: Normal rate and regular rhythm.  Pulmonary:     Effort: Pulmonary effort is normal.     Breath sounds: Normal breath sounds.  Abdominal:     General: Bowel sounds are normal.     Palpations: Abdomen is soft.     Tenderness: There is no abdominal tenderness.  Musculoskeletal:     Cervical back: Neck supple.  Skin:    General: Skin is warm and dry.  Neurological:     Mental Status: She is alert and oriented to person, place, and time.     Cranial Nerves: No cranial nerve  deficit.  Psychiatric:        Mood and Affect: Mood normal.          Assessment & Plan:      This visit occurred during the SARS-CoV-2 public health emergency.  Safety protocols were in place, including screening questions prior to the visit, additional usage of staff PPE, and extensive cleaning of exam room while observing appropriate contact time as indicated for disinfecting solutions.

## 2021-08-01 NOTE — Assessment & Plan Note (Signed)
Doing well on bupropion SR 200 mg daily.  Continue same.   Refills provided.

## 2021-08-07 ENCOUNTER — Other Ambulatory Visit: Payer: Self-pay

## 2021-08-07 ENCOUNTER — Encounter (INDEPENDENT_AMBULATORY_CARE_PROVIDER_SITE_OTHER): Payer: Self-pay | Admitting: Family Medicine

## 2021-08-07 ENCOUNTER — Ambulatory Visit (INDEPENDENT_AMBULATORY_CARE_PROVIDER_SITE_OTHER): Payer: Medicare Other | Admitting: Family Medicine

## 2021-08-07 VITALS — BP 134/82 | HR 100 | Temp 98.5°F | Ht 66.0 in | Wt 207.0 lb

## 2021-08-07 DIAGNOSIS — Z6834 Body mass index (BMI) 34.0-34.9, adult: Secondary | ICD-10-CM | POA: Diagnosis not present

## 2021-08-07 DIAGNOSIS — E669 Obesity, unspecified: Secondary | ICD-10-CM

## 2021-08-07 DIAGNOSIS — E7849 Other hyperlipidemia: Secondary | ICD-10-CM

## 2021-08-08 NOTE — Progress Notes (Signed)
Chief Complaint:   OBESITY Jillian [name] is here to discuss her progress with her obesity treatment plan along with follow-up of her obesity related diagnoses. Jillian [name] is on keeping a food journal and adhering to recommended goals of 1300-1600 calories and 85+ grams of protein daily and states she is following her eating plan approximately 50% of the time. Jillian [name] states she is doing 0 minutes 0 times per week.  Today's visit was #: 12 Starting weight: 223 lbs Starting date: 12/29/2019 Today's weight: 207 lbs Today's date: 08/07/2021 Total lbs lost to date: 16 Total lbs lost since last in-office visit: 0  Interim History: Jillian [name] has done well with minimizing holiday weight gain. She is working on Research officer, trade union and trying to meet her protein goals.  Subjective:   1. Other hyperlipidemia Jillian [name]'s LDL has increased with her recent labs. Her HDL has also increased and is very good. She has questions about nutrition and how to decrease her cholesterol.  Assessment/Plan:   1. Other hyperlipidemia Cardiovascular risk and specific lipid/LDL goals reviewed. We discussed several lifestyle modifications today. Jillian [name] is to concentrate on lean meats and continue to increase vegetables. This is likely a transitional increase, so we will recheck labs in 3 months. Orders and follow up as documented in patient record.   2. Obesity with current BMI of 33.4 Jillian [name] is currently in the action stage of change. As such, her goal is to continue with weight loss efforts. She has agreed to keeping a food journal and adhering to recommended goals of 1300-1600 calories and 85+ grams of protein daily.   Exercise goals: Chair yoga.  Behavioral modification strategies: increasing lean protein intake.  Jillian [name] has agreed to follow-up with our clinic in 3 weeks. She was informed of the importance of frequent follow-up visits to maximize her success with intensive lifestyle modifications for her multiple health  conditions.   Objective:   Blood pressure 134/82, pulse 100, temperature 98.5 F (36.9 C), height 5\' 6"  (1.676 m), weight 207 lb (93.9 kg), SpO2 100 %. Body mass index is 33.41 kg/m.  General: Cooperative, alert, well developed, in no acute distress. HEENT: Conjunctivae and lids unremarkable. Cardiovascular: Regular rhythm.  Lungs: Normal work of breathing. Neurologic: No focal deficits.   Lab Results  Component Value Date   CREATININE 0.95 07/18/2021   BUN 17 07/18/2021   NA 141 07/18/2021   K 4.4 07/18/2021   CL 101 07/18/2021   CO2 23 07/18/2021   Lab Results  Component Value Date   ALT 26 07/18/2021   AST 27 07/18/2021   ALKPHOS 98 07/18/2021   BILITOT 0.3 07/18/2021   Lab Results  Component Value Date   HGBA1C 5.6 07/18/2021   HGBA1C 5.7 (H) 04/03/2021   HGBA1C 5.9 (H) 12/28/2020   HGBA1C 6.0 07/11/2020   HGBA1C 5.5 05/12/2019   Lab Results  Component Value Date   INSULIN 10.9 07/18/2021   INSULIN 12.6 04/03/2021   INSULIN 11.3 12/28/2020   INSULIN 10.8 05/12/2019   INSULIN 7.4 10/27/2018   Lab Results  Component Value Date   TSH 2.670 07/18/2021   Lab Results  Component Value Date   CHOL 218 (H) 07/18/2021   HDL 90 07/18/2021   LDLCALC 120 (H) 07/18/2021   LDLDIRECT 158.6 08/18/2013   TRIG 45 07/18/2021   CHOLHDL 2 07/11/2020   Lab Results  Component Value Date   VD25OH 49.9 07/18/2021   VD25OH 63.3 04/03/2021   VD25OH 54.9 12/28/2020   Lab Results  Component  Value Date   WBC 5.0 07/18/2021   HGB 14.0 07/18/2021   HCT 44.4 07/18/2021   MCV 87 07/18/2021   PLT 263 07/18/2021   No results found for: IRON, TIBC, FERRITIN  Attestation Statements:   Reviewed by clinician on day of visit: allergies, medications, problem list, medical history, surgical history, family history, social history, and previous encounter notes.  Time spent on visit including pre-visit chart review and post-visit care and charting was 30 minutes.    I, Trixie Dredge, am acting as transcriptionist for Dennard Nip, MD.  I have reviewed the above documentation for accuracy and completeness, and I agree with the above. -  Dennard Nip, MD

## 2021-08-15 DIAGNOSIS — E785 Hyperlipidemia, unspecified: Secondary | ICD-10-CM

## 2021-08-15 MED ORDER — ATORVASTATIN CALCIUM 20 MG PO TABS: 20.0000 mg | ORAL_TABLET | Freq: Every evening | ORAL | 0 refills | Status: DC

## 2021-08-15 NOTE — Telephone Encounter (Signed)
Noted and appreciate the follow up. 

## 2021-08-15 NOTE — Telephone Encounter (Signed)
Per patient request I have sent in 14 day supply to local pharmacy. Let her know in my chart as well.

## 2021-08-20 DIAGNOSIS — F331 Major depressive disorder, recurrent, moderate: Secondary | ICD-10-CM

## 2021-08-20 DIAGNOSIS — R7303 Prediabetes: Secondary | ICD-10-CM

## 2021-08-22 MED ORDER — METFORMIN HCL 500 MG PO TABS
500.0000 mg | ORAL_TABLET | Freq: Two times a day (BID) | ORAL | 0 refills | Status: DC
Start: 2021-08-22 — End: 2021-08-28

## 2021-08-22 MED ORDER — BUPROPION HCL ER (SR) 200 MG PO TB12: 200.0000 mg | ORAL_TABLET | Freq: Two times a day (BID) | ORAL | 0 refills | Status: DC

## 2021-08-23 LAB — HM DEXA SCAN

## 2021-08-24 ENCOUNTER — Encounter: Payer: Self-pay | Admitting: Primary Care

## 2021-08-28 ENCOUNTER — Ambulatory Visit (INDEPENDENT_AMBULATORY_CARE_PROVIDER_SITE_OTHER): Payer: Medicare Other | Admitting: Family Medicine

## 2021-08-28 ENCOUNTER — Encounter (INDEPENDENT_AMBULATORY_CARE_PROVIDER_SITE_OTHER): Payer: Self-pay | Admitting: Family Medicine

## 2021-08-28 ENCOUNTER — Other Ambulatory Visit: Payer: Self-pay

## 2021-08-28 VITALS — BP 131/81 | HR 76 | Temp 98.1°F | Ht 66.0 in | Wt 211.0 lb

## 2021-08-28 DIAGNOSIS — R7303 Prediabetes: Secondary | ICD-10-CM

## 2021-08-28 DIAGNOSIS — Z6834 Body mass index (BMI) 34.0-34.9, adult: Secondary | ICD-10-CM | POA: Diagnosis not present

## 2021-08-29 NOTE — Progress Notes (Signed)
Chief Complaint:   OBESITY Jillian [name] is here to discuss her progress with her obesity treatment plan along with follow-up of her obesity related diagnoses. Jillian [name] is on keeping a food journal and adhering to recommended goals of 1300-1500 calories and 85+ grams of protein daily and states she is following her eating plan approximately 50% of the time. Jillian [name] states she is on the elliptical for 30 minutes 3 times per week.  Today's visit was #: 34 Starting weight: 223 lbs Starting date: 12/29/2019 Today's weight: 211 lbs Today's date: 08/28/2021 Total lbs lost to date: 12 Total lbs lost since last in-office visit: 0  Interim History: Jillian [name] did some celebration eating over the holidays. She is ready to get back on track.  Subjective:   1. Pre-diabetes Jillian [name] is stable on metformin with no GI updet. She is working on getting back to her diet and exercise plan.  Assessment/Plan:   1. Pre-diabetes Jillian [name] will continue her diet and exercise as is, and we will recheck labs in 1-2 months.    2. Obesity BMI today is 3 Jillian [name] is currently in the action stage of change. As such, her goal is to get back to weightloss efforts . She has agreed to the Category 2 Plan or keeping a food journal and adhering to recommended goals of 1300-1600 calories and 85+ grams of protein daily.   Exercise goals: As is.  Behavioral modification strategies: increasing lean protein intake.  Jillian [name] has agreed to follow-up with our clinic in 3 weeks. She was informed of the importance of frequent follow-up visits to maximize her success with intensive lifestyle modifications for her multiple health conditions.   Objective:   Blood pressure 131/81, pulse 76, temperature 98.1 F (36.7 C), height 5\' 6"  (1.676 m), weight 211 lb (95.7 kg), SpO2 100 %. Body mass index is 34.06 kg/m.  General: Cooperative, alert, well developed, in no acute distress. HEENT: Conjunctivae and lids  unremarkable. Cardiovascular: Regular rhythm.  Lungs: Normal work of breathing. Neurologic: No focal deficits.   Lab Results  Component Value Date   CREATININE 0.95 07/18/2021   BUN 17 07/18/2021   NA 141 07/18/2021   K 4.4 07/18/2021   CL 101 07/18/2021   CO2 23 07/18/2021   Lab Results  Component Value Date   ALT 26 07/18/2021   AST 27 07/18/2021   ALKPHOS 98 07/18/2021   BILITOT 0.3 07/18/2021   Lab Results  Component Value Date   HGBA1C 5.6 07/18/2021   HGBA1C 5.7 (H) 04/03/2021   HGBA1C 5.9 (H) 12/28/2020   HGBA1C 6.0 07/11/2020   HGBA1C 5.5 05/12/2019   Lab Results  Component Value Date   INSULIN 10.9 07/18/2021   INSULIN 12.6 04/03/2021   INSULIN 11.3 12/28/2020   INSULIN 10.8 05/12/2019   INSULIN 7.4 10/27/2018   Lab Results  Component Value Date   TSH 2.670 07/18/2021   Lab Results  Component Value Date   CHOL 218 (H) 07/18/2021   HDL 90 07/18/2021   LDLCALC 120 (H) 07/18/2021   LDLDIRECT 158.6 08/18/2013   TRIG 45 07/18/2021   CHOLHDL 2 07/11/2020   Lab Results  Component Value Date   VD25OH 49.9 07/18/2021   VD25OH 63.3 04/03/2021   VD25OH 54.9 12/28/2020   Lab Results  Component Value Date   WBC 5.0 07/18/2021   HGB 14.0 07/18/2021   HCT 44.4 07/18/2021   MCV 87 07/18/2021   PLT 263 07/18/2021   No results found for: IRON, TIBC, FERRITIN  Attestation Statements:   Reviewed by clinician on day of visit: allergies, medications, problem list, medical history, surgical history, family history, social history, and previous encounter notes.  Time spent on visit including pre-visit chart review and post-visit care and charting was 20 minutes.    I, Trixie Dredge, am acting as transcriptionist for Dennard Nip, MD.  I have reviewed the above documentation for accuracy and completeness, and I agree with the above. -  Dennard Nip, MD

## 2021-09-05 ENCOUNTER — Encounter: Payer: Self-pay | Admitting: Primary Care

## 2021-09-19 ENCOUNTER — Other Ambulatory Visit: Payer: Self-pay

## 2021-09-19 ENCOUNTER — Encounter (INDEPENDENT_AMBULATORY_CARE_PROVIDER_SITE_OTHER): Payer: Self-pay | Admitting: Family Medicine

## 2021-09-19 ENCOUNTER — Ambulatory Visit (INDEPENDENT_AMBULATORY_CARE_PROVIDER_SITE_OTHER): Payer: Medicare Other | Admitting: Family Medicine

## 2021-09-19 VITALS — BP 135/83 | HR 91 | Temp 98.4°F | Ht 66.0 in | Wt 209.0 lb

## 2021-09-19 DIAGNOSIS — E669 Obesity, unspecified: Secondary | ICD-10-CM

## 2021-09-19 DIAGNOSIS — K5909 Other constipation: Secondary | ICD-10-CM

## 2021-09-19 DIAGNOSIS — Z6833 Body mass index (BMI) 33.0-33.9, adult: Secondary | ICD-10-CM | POA: Diagnosis not present

## 2021-09-19 DIAGNOSIS — Z6836 Body mass index (BMI) 36.0-36.9, adult: Secondary | ICD-10-CM

## 2021-09-19 NOTE — Progress Notes (Signed)
Chief Complaint:   OBESITY Jillian [name] is here to discuss her progress with her obesity treatment plan along with follow-up of her obesity related diagnoses. Jillian [name] is following a lower carbohydrate, vegetable and lean protein rich diet plan or keeping a food journal and adhering to recommended goals of 1300-1600 calories and 85+ grams of protein daily and states she is following her eating plan approximately 80% of the time. Jillian [name] states she is on the treadmill for 20 minutes 1 time per week.  Today's visit was #: 14 Starting weight: 223 lbs Starting date: 12/29/2019 Today's weight: 209 lbs Today's date: 09/19/2021 Total lbs lost to date: 14 Total lbs lost since last in-office visit: 2  Interim History: Jillian [name] has done well with changing to a low carbohydrate plan. She is getting good support from her husband. She is working on meal planning and trying to keep her food options interesting.  Subjective:   1. Other constipation Jillian [name] is on a stool softener most nights and she feels this is helpful. Her BM is not painful and no abdominal cramping noted.  Assessment/Plan:   1. Other constipation Jillian [name] was educated on ways to increase fiber while on a low carbohydrate diet. She will continue with stool softeners as is. Orders and follow up as documented in patient record.   2. Obesity BMI today is 33.8 Shatarra is currently in the action stage of change. As such, her goal is to continue with weight loss efforts. She has agreed to following a lower carbohydrate, vegetable and lean protein rich diet plan.   Exercise goals: As is.  Behavioral modification strategies: increasing lean protein intake and no skipping meals.  Jillian [name] has agreed to follow-up with our clinic in 3 weeks. She was informed of the importance of frequent follow-up visits to maximize her success with intensive lifestyle modifications for her multiple health conditions.   Objective:   Blood pressure 135/83,  pulse 91, temperature 98.4 F (36.9 C), height 5\' 6"  (1.676 m), weight 209 lb (94.8 kg), SpO2 100 %. Body mass index is 33.73 kg/m.  General: Cooperative, alert, well developed, in no acute distress. HEENT: Conjunctivae and lids unremarkable. Cardiovascular: Regular rhythm.  Lungs: Normal work of breathing. Neurologic: No focal deficits.   Lab Results  Component Value Date   CREATININE 0.95 07/18/2021   BUN 17 07/18/2021   NA 141 07/18/2021   K 4.4 07/18/2021   CL 101 07/18/2021   CO2 23 07/18/2021   Lab Results  Component Value Date   ALT 26 07/18/2021   AST 27 07/18/2021   ALKPHOS 98 07/18/2021   BILITOT 0.3 07/18/2021   Lab Results  Component Value Date   HGBA1C 5.6 07/18/2021   HGBA1C 5.7 (H) 04/03/2021   HGBA1C 5.9 (H) 12/28/2020   HGBA1C 6.0 07/11/2020   HGBA1C 5.5 05/12/2019   Lab Results  Component Value Date   INSULIN 10.9 07/18/2021   INSULIN 12.6 04/03/2021   INSULIN 11.3 12/28/2020   INSULIN 10.8 05/12/2019   INSULIN 7.4 10/27/2018   Lab Results  Component Value Date   TSH 2.670 07/18/2021   Lab Results  Component Value Date   CHOL 218 (H) 07/18/2021   HDL 90 07/18/2021   LDLCALC 120 (H) 07/18/2021   LDLDIRECT 158.6 08/18/2013   TRIG 45 07/18/2021   CHOLHDL 2 07/11/2020   Lab Results  Component Value Date   VD25OH 49.9 07/18/2021   VD25OH 63.3 04/03/2021   VD25OH 54.9 12/28/2020   Lab Results  Component Value Date   WBC 5.0 07/18/2021   HGB 14.0 07/18/2021   HCT 44.4 07/18/2021   MCV 87 07/18/2021   PLT 263 07/18/2021   No results found for: IRON, TIBC, FERRITIN  Obesity Behavioral Intervention:   Approximately 15 minutes were spent on the discussion below.  ASK: We discussed the diagnosis of obesity with [place] today and [place] agreed to give [place] permission to discuss obesity behavioral modification therapy today.  ASSESS: Jillian [name] has the diagnosis of obesity and her BMI today is 33.8. Jillian [name] is in the action stage of  change.   ADVISE: Jillian [name] was educated on the multiple health risks of obesity as well as the benefit of weight loss to improve her health. She was advised of the need for long term treatment and the importance of lifestyle modifications to improve her current health and to decrease her risk of future health problems.  AGREE: Multiple dietary modification options and treatment options were discussed and Jillian [name] agreed to follow the recommendations documented in the above note.  ARRANGE: Jillian [name] was educated on the importance of frequent visits to treat obesity as outlined per CMS and USPSTF guidelines and agreed to schedule her next follow up appointment today.  Attestation Statements:   Reviewed by clinician on day of visit: allergies, medications, problem list, medical history, surgical history, family history, social history, and previous encounter notes.   I, Trixie Dredge, am acting as transcriptionist for Dennard Nip, MD.  I have reviewed the above documentation for accuracy and completeness, and I agree with the above. -  Dennard Nip, MD

## 2021-10-16 ENCOUNTER — Other Ambulatory Visit: Payer: Self-pay

## 2021-10-16 ENCOUNTER — Encounter (INDEPENDENT_AMBULATORY_CARE_PROVIDER_SITE_OTHER): Payer: Self-pay | Admitting: Family Medicine

## 2021-10-16 ENCOUNTER — Ambulatory Visit (INDEPENDENT_AMBULATORY_CARE_PROVIDER_SITE_OTHER): Payer: Medicare Other | Admitting: Family Medicine

## 2021-10-16 VITALS — BP 126/79 | HR 97 | Temp 98.4°F | Ht 66.0 in | Wt 207.0 lb

## 2021-10-16 DIAGNOSIS — Z6834 Body mass index (BMI) 34.0-34.9, adult: Secondary | ICD-10-CM

## 2021-10-16 DIAGNOSIS — Z6833 Body mass index (BMI) 33.0-33.9, adult: Secondary | ICD-10-CM | POA: Diagnosis not present

## 2021-10-16 DIAGNOSIS — E669 Obesity, unspecified: Secondary | ICD-10-CM

## 2021-10-16 DIAGNOSIS — K5909 Other constipation: Secondary | ICD-10-CM

## 2021-10-17 NOTE — Progress Notes (Signed)
Chief Complaint:   OBESITY Jillian [name] is here to discuss her progress with her obesity treatment plan along with follow-up of her obesity related diagnoses. Jillian [name] is on following a lower carbohydrate, vegetable and lean protein rich diet plan and states she is following her eating plan approximately 70% of the time. Jillian [name] states she is on the elliptical for 20 minutes 3 times per week.  Today's visit was #: 15 Starting weight: 223 lbs Starting date: 12/29/2019 Today's weight: 207 lbs Today's date: 10/16/2021 Total lbs lost to date: 16 Total lbs lost since last in-office visit: 2  Interim History: Jillian [name] continue to do well with weight loss. Her hunger is mostly controlled, and she is staying active overall. She does the low carbohydrate plan most of the time and feels well overall.  Subjective:   1. Other constipation Oluwaseyi notes eating oat meal 2-3 times per week seems to improve her constipation, which generally worsens on a low carbohydrate plan.  Assessment/Plan:   1. Other constipation Jillian [name] is ok to eat oat meal occasionally, increase her water intake, and we will continue to follow.   2. Obesity with current BMI of 33.4 Jillian [name] is currently in the action stage of change. As such, her goal is to continue with weight loss efforts. She has agreed to following a lower carbohydrate, vegetable and lean protein rich diet plan.   We will recheck fasting labs at her next visit.  Exercise goals: As is.  Behavioral modification strategies: increasing lean protein intake and meal planning and cooking strategies.  Jillian [name] has agreed to follow-up with our clinic in 4 weeks. She was informed of the importance of frequent follow-up visits to maximize her success with intensive lifestyle modifications for her multiple health conditions.   Objective:   Blood pressure 126/79, pulse 97, temperature 98.4 F (36.9 C), height 5\' 6"  (1.676 m), weight 207 lb (93.9 kg), SpO2 99  %. Body mass index is 33.41 kg/m.  General: Cooperative, alert, well developed, in no acute distress. HEENT: Conjunctivae and lids unremarkable. Cardiovascular: Regular rhythm.  Lungs: Normal work of breathing. Neurologic: No focal deficits.   Lab Results  Component Value Date   CREATININE 0.95 07/18/2021   BUN 17 07/18/2021   NA 141 07/18/2021   K 4.4 07/18/2021   CL 101 07/18/2021   CO2 23 07/18/2021   Lab Results  Component Value Date   ALT 26 07/18/2021   AST 27 07/18/2021   ALKPHOS 98 07/18/2021   BILITOT 0.3 07/18/2021   Lab Results  Component Value Date   HGBA1C 5.6 07/18/2021   HGBA1C 5.7 (H) 04/03/2021   HGBA1C 5.9 (H) 12/28/2020   HGBA1C 6.0 07/11/2020   HGBA1C 5.5 05/12/2019   Lab Results  Component Value Date   INSULIN 10.9 07/18/2021   INSULIN 12.6 04/03/2021   INSULIN 11.3 12/28/2020   INSULIN 10.8 05/12/2019   INSULIN 7.4 10/27/2018   Lab Results  Component Value Date   TSH 2.670 07/18/2021   Lab Results  Component Value Date   CHOL 218 (H) 07/18/2021   HDL 90 07/18/2021   LDLCALC 120 (H) 07/18/2021   LDLDIRECT 158.6 08/18/2013   TRIG 45 07/18/2021   CHOLHDL 2 07/11/2020   Lab Results  Component Value Date   VD25OH 49.9 07/18/2021   VD25OH 63.3 04/03/2021   VD25OH 54.9 12/28/2020   Lab Results  Component Value Date   WBC 5.0 07/18/2021   HGB 14.0 07/18/2021   HCT 44.4 07/18/2021  MCV 87 07/18/2021   PLT 263 07/18/2021   No results found for: IRON, TIBC, FERRITIN  Attestation Statements:   Reviewed by clinician on day of visit: allergies, medications, problem list, medical history, surgical history, family history, social history, and previous encounter notes.  Time spent on visit including pre-visit chart review and post-visit care and charting was 30 minutes.    I, Trixie Dredge, am acting as transcriptionist for Dennard Nip, MD.  I have reviewed the above documentation for accuracy and completeness, and I agree with  the above. -  Dennard Nip, MD

## 2021-10-18 HISTORY — PX: SKIN BIOPSY: SHX1

## 2021-11-06 ENCOUNTER — Encounter (INDEPENDENT_AMBULATORY_CARE_PROVIDER_SITE_OTHER): Payer: Self-pay | Admitting: Family Medicine

## 2021-11-06 ENCOUNTER — Ambulatory Visit (INDEPENDENT_AMBULATORY_CARE_PROVIDER_SITE_OTHER): Payer: Medicare Other | Admitting: Family Medicine

## 2021-11-06 ENCOUNTER — Other Ambulatory Visit: Payer: Self-pay

## 2021-11-06 VITALS — BP 142/90 | HR 87 | Temp 98.2°F | Ht 66.0 in | Wt 205.0 lb

## 2021-11-06 DIAGNOSIS — Z6833 Body mass index (BMI) 33.0-33.9, adult: Secondary | ICD-10-CM | POA: Diagnosis not present

## 2021-11-06 DIAGNOSIS — R739 Hyperglycemia, unspecified: Secondary | ICD-10-CM

## 2021-11-06 DIAGNOSIS — E559 Vitamin D deficiency, unspecified: Secondary | ICD-10-CM

## 2021-11-06 DIAGNOSIS — E669 Obesity, unspecified: Secondary | ICD-10-CM

## 2021-11-06 DIAGNOSIS — E7849 Other hyperlipidemia: Secondary | ICD-10-CM

## 2021-11-07 LAB — LIPID PANEL WITH LDL/HDL RATIO
Cholesterol, Total: 185 mg/dL (ref 100–199)
HDL: 89 mg/dL (ref >39)
LDL Chol Calc (NIH): 87 mg/dL (ref 0–99)
LDL/HDL Ratio: 1 ratio (ref 0.0–3.2)
Triglycerides: 45 mg/dL (ref 0–149)
VLDL Cholesterol Cal: 9 mg/dL (ref 5–40)

## 2021-11-07 LAB — COMPREHENSIVE METABOLIC PANEL
ALT: 17 IU/L (ref 0–32)
AST: 19 IU/L (ref 0–40)
Albumin/Globulin Ratio: 1.6 (ref 1.2–2.2)
Albumin: 4.4 g/dL (ref 3.8–4.8)
Alkaline Phosphatase: 80 IU/L (ref 44–121)
BUN/Creatinine Ratio: 13 (ref 12–28)
BUN: 14 mg/dL (ref 8–27)
Bilirubin Total: 0.3 mg/dL (ref 0.0–1.2)
CO2: 23 mmol/L (ref 20–29)
Calcium: 9.5 mg/dL (ref 8.7–10.3)
Chloride: 106 mmol/L (ref 96–106)
Creatinine, Ser: 1.05 mg/dL — ABNORMAL HIGH (ref 0.57–1.00)
Globulin, Total: 2.8 g/dL (ref 1.5–4.5)
Glucose: 91 mg/dL (ref 70–99)
Potassium: 4.3 mmol/L (ref 3.5–5.2)
Sodium: 142 mmol/L (ref 134–144)
Total Protein: 7.2 g/dL (ref 6.0–8.5)
eGFR: 59 mL/min/{1.73_m2} — ABNORMAL LOW (ref >59)

## 2021-11-07 LAB — INSULIN, RANDOM: INSULIN: 11.6 u[IU]/mL (ref 2.6–24.9)

## 2021-11-07 LAB — HEMOGLOBIN A1C
Est. average glucose Bld gHb Est-mCnc: 117 mg/dL
Hgb A1c MFr Bld: 5.7 % — ABNORMAL HIGH (ref 4.8–5.6)

## 2021-11-07 LAB — VITAMIN D 25 HYDROXY (VIT D DEFICIENCY, FRACTURES): Vit D, 25-Hydroxy: 83.1 ng/mL (ref 30.0–100.0)

## 2021-11-08 ENCOUNTER — Encounter (INDEPENDENT_AMBULATORY_CARE_PROVIDER_SITE_OTHER): Payer: Self-pay | Admitting: Family Medicine

## 2021-11-08 NOTE — Telephone Encounter (Signed)
Dr.Ukleja 

## 2021-11-08 NOTE — Progress Notes (Signed)
? ? ? ?Chief Complaint:  ? ?OBESITY ?Jillian [name] is here to discuss her progress with her obesity treatment plan along with follow-up of her obesity related diagnoses. Jillian [name] is on the Category 2 Plan and following a lower carbohydrate, vegetable and lean protein rich diet plan and states she is following her eating plan approximately 80-85% of the time. Jillian [name] states she is walking for 20 minutes.   ? ?Today's visit was #: 16 ?Starting weight: 223 lbs ?Starting date: 12/29/2019 ?Today's weight: 205 lbs ?Today's date: 11/06/2021 ?Total lbs lost to date: 80 ?Total lbs lost since last in-office visit: 2 ? ?Interim History: Jillian [name] has had a good experience so far with our clinic. Previously did the Lower carb plan which worsened her constipation, so she went back to Category 2 to be able to incorporate. She is having oatmeal 2 times per week and yogurt. She is going to [place] on vacation in the next few weeks (she leaves next weekend). ? ?Subjective:  ? ?1. Hyperglycemia ?Jillian [name]'s last A1c was 5.6 in November 2022. She is on metformin. ? ?2. Other hyperlipidemia ?Jillian [name] is on Lipitor, and she denies myalgias or transaminitis. ? ?3. Vitamin D deficiency ?Jillian [name] is not on Vitamin D, and she notes fatigue. ? ?Assessment/Plan:  ? ?1. Hyperglycemia ?We will check labs today, and will follow up at Jillian [name]'s next appointment. ? ?- Hemoglobin A1c ?- Insulin, random ? ?2. Other hyperlipidemia ?We will check labs today, and will follow up at Jillian [name]'s next appointment. ? ?- Comprehensive metabolic panel ?- Lipid Panel With LDL/HDL Ratio ? ?3. Vitamin D deficiency ?We will check labs today, and will follow up at Jillian [name]'s next appointment. ? ?- VITAMIN D 25 Hydroxy (Vit-D Deficiency, Fractures) ? ?4. Obesity with current BMI of 33.2 ?Jillian [name] is currently in the action stage of change. As such, her goal is to continue with weight loss efforts. She has agreed to the Category 2 Plan.  ? ?Exercise goals: All adults should avoid  inactivity. Some physical activity is better than none, and adults who participate in any amount of physical activity gain some health benefits. ? ?Behavioral modification strategies: increasing lean protein intake, meal planning and cooking strategies, and keeping healthy foods in the home. ? ?Jillian [name] has agreed to follow-up with our clinic in 3 to 4 weeks. She was informed of the importance of frequent follow-up visits to maximize her success with intensive lifestyle modifications for her multiple health conditions.  ? ?Jillian [name] was informed we would discuss her lab results at her next visit unless there is a critical issue that needs to be addressed sooner. Jillian [name] agreed to keep her next visit at the agreed upon time to discuss these results. ? ?Objective:  ? ?Blood pressure (!) 142/90, pulse 87, temperature 98.2 ?F (36.8 ?C), height '5\' 6"'$  (1.676 m), weight 205 lb (93 kg), SpO2 99 %. ?Body mass index is 33.09 kg/m?. ? ?General: Cooperative, alert, well developed, in no acute distress. ?HEENT: Conjunctivae and lids unremarkable. ?Cardiovascular: Regular rhythm.  ?Lungs: Normal work of breathing. ?Neurologic: No focal deficits.  ? ?Lab Results  ?Component Value Date  ? CREATININE 1.05 (H) 11/06/2021  ? BUN 14 11/06/2021  ? NA 142 11/06/2021  ? K 4.3 11/06/2021  ? CL 106 11/06/2021  ? CO2 23 11/06/2021  ? ?Lab Results  ?Component Value Date  ? ALT 17 11/06/2021  ? AST 19 11/06/2021  ? ALKPHOS 80 11/06/2021  ? BILITOT 0.3 11/06/2021  ? ?Lab Results  ?Component Value Date  ?  HGBA1C 5.7 (H) 11/06/2021  ? HGBA1C 5.6 07/18/2021  ? HGBA1C 5.7 (H) 04/03/2021  ? HGBA1C 5.9 (H) 12/28/2020  ? HGBA1C 6.0 07/11/2020  ? ?Lab Results  ?Component Value Date  ? INSULIN 11.6 11/06/2021  ? INSULIN 10.9 07/18/2021  ? INSULIN 12.6 04/03/2021  ? INSULIN 11.3 12/28/2020  ? INSULIN 10.8 05/12/2019  ? ?Lab Results  ?Component Value Date  ? TSH 2.670 07/18/2021  ? ?Lab Results  ?Component Value Date  ? CHOL 185 11/06/2021  ? HDL 89  11/06/2021  ? [place] 87 11/06/2021  ? LDLDIRECT 158.6 08/18/2013  ? TRIG 45 11/06/2021  ? CHOLHDL 2 07/11/2020  ? ?Lab Results  ?Component Value Date  ? VD25OH 83.1 11/06/2021  ? VD25OH 49.9 07/18/2021  ? VD25OH 63.3 04/03/2021  ? ?Lab Results  ?Component Value Date  ? WBC 5.0 07/18/2021  ? HGB 14.0 07/18/2021  ? HCT 44.4 07/18/2021  ? MCV 87 07/18/2021  ? PLT 263 07/18/2021  ? ?No results found for: IRON, TIBC, FERRITIN ? ?Obesity Behavioral Intervention:  ? ?Approximately 15 minutes were spent on the discussion below. ? ?ASK: ?We discussed the diagnosis of obesity with Jillian [name] today and Jillian [name] agreed to give [place] permission to discuss obesity behavioral modification therapy today. ? ?ASSESS: ?Jillian [name] has the diagnosis of obesity and her BMI today is 33.2. Jillian [name] is in the action stage of change.  ? ?ADVISE: ?Jillian [name] was educated on the multiple health risks of obesity as well as the benefit of weight loss to improve her health. She was advised of the need for long term treatment and the importance of lifestyle modifications to improve her current health and to decrease her risk of future health problems. ? ?AGREE: ?Multiple dietary modification options and treatment options were discussed and Jillian [name] agreed to follow the recommendations documented in the above note. ? ?ARRANGE: ?Jillian [name] was educated on the importance of frequent visits to treat obesity as outlined per CMS and USPSTF guidelines and agreed to schedule her next follow up appointment today. ? ?Attestation Statements:  ? ?Reviewed by clinician on day of visit: allergies, medications, problem list, medical history, surgical history, family history, social history, and previous encounter notes. ? ? ?I, Trixie Dredge, am acting as transcriptionist for Coralie Common, MD. ? ?I have reviewed the above documentation for accuracy and completeness, and I agree with the above. Coralie Common, MD ? ? ?

## 2021-11-08 NOTE — Telephone Encounter (Signed)
Please advise 

## 2021-11-15 NOTE — Telephone Encounter (Signed)
Please advise 

## 2021-12-05 ENCOUNTER — Ambulatory Visit (INDEPENDENT_AMBULATORY_CARE_PROVIDER_SITE_OTHER): Payer: Medicare Other | Admitting: Family Medicine

## 2021-12-05 ENCOUNTER — Encounter (INDEPENDENT_AMBULATORY_CARE_PROVIDER_SITE_OTHER): Payer: Self-pay | Admitting: Family Medicine

## 2021-12-05 VITALS — BP 138/82 | HR 89 | Temp 98.4°F | Ht 66.0 in | Wt 205.0 lb

## 2021-12-05 DIAGNOSIS — R7303 Prediabetes: Secondary | ICD-10-CM | POA: Diagnosis not present

## 2021-12-05 DIAGNOSIS — E669 Obesity, unspecified: Secondary | ICD-10-CM

## 2021-12-05 DIAGNOSIS — E559 Vitamin D deficiency, unspecified: Secondary | ICD-10-CM

## 2021-12-05 DIAGNOSIS — E7849 Other hyperlipidemia: Secondary | ICD-10-CM | POA: Diagnosis not present

## 2021-12-18 NOTE — Progress Notes (Signed)
? ? ? ?Chief Complaint:  ? ?OBESITY ?Jillian [name] is here to discuss her progress with her obesity treatment plan along with follow-up of her obesity related diagnoses. Jillian [name] is on the Category 2 Plan and states she is following her eating plan approximately 25% of the time. Jillian [name] states she is doing yard work for 5-6 hours 3 times per week.   ? ?Today's visit was #: 54 ?Starting weight: 223 lbs ?Starting date: 12/29/2019 ?Today's weight: 205 lbs ?Today's date: 12/05/2021 ?Total lbs lost to date: 26 ?Total lbs lost since last in-office visit: 0 ? ?Interim History: Jillian [name] did well with maintaining her weight even while on vacation. She worked on [employer] and she has been more active. She is ready to get back on track.  ? ?Subjective:  ? ?1. Other hyperlipidemia ?Jillian [name]'s LDL improved with decreased cholesterol in her diet and on Lipitor. She denies chest pain or myalgias. I discussed labs with the patient today.  ? ?2. Vitamin D deficiency ?Jillian [name] is on multivitamin and Vitamin D OTC. Her level is at goal but she is at high risk of over-replacement. I discussed labs with the patient today.  ? ?3. Pre-diabetes ?Jillian [name] is working on her diet and exercise, and A1c slightly increased. I discussed labs with the patient today. ? ?Assessment/Plan:  ? ?1. Other hyperlipidemia ?Jillian [name] will continue her diet and Lipitor, and we will recheck labs in 3 months.  ? ?2. Vitamin D deficiency ?Jillian [name] agreed to discontinue Vitamin D, but continue her multivitamins.  ? ?3. Pre-diabetes ?Jillian [name] will continue metformin as is, and will continue her diet and exercise. We will recheck labs in 3 months. ? ?4. Obesity, with current BMI of 33.1 ?Jillian [name] is currently in the action stage of change. As such, her goal is to continue with weight loss efforts. She has agreed to the Category 2 Plan.  ? ?Exercise goals: As is. ? ?Behavioral modification strategies: decreasing simple carbohydrates and increasing  vegetables. ? ?Jillian [name] has agreed to follow-up with our clinic in 4 weeks. She was informed of the importance of frequent follow-up visits to maximize her success with intensive lifestyle modifications for her multiple health conditions.  ? ?Objective:  ? ?Blood pressure 138/82, pulse 89, temperature 98.4 ?F (36.9 ?C), height '5\' 6"'$  (1.676 m), weight 205 lb (93 kg), SpO2 100 %. ?Body mass index is 33.09 kg/m?. ? ?General: Cooperative, alert, well developed, in no acute distress. ?HEENT: Conjunctivae and lids unremarkable. ?Cardiovascular: Regular rhythm.  ?Lungs: Normal work of breathing. ?Neurologic: No focal deficits.  ? ?Lab Results  ?Component Value Date  ? CREATININE 1.05 (H) 11/06/2021  ? BUN 14 11/06/2021  ? NA 142 11/06/2021  ? K 4.3 11/06/2021  ? CL 106 11/06/2021  ? CO2 23 11/06/2021  ? ?Lab Results  ?Component Value Date  ? ALT 17 11/06/2021  ? AST 19 11/06/2021  ? ALKPHOS 80 11/06/2021  ? BILITOT 0.3 11/06/2021  ? ?Lab Results  ?Component Value Date  ? HGBA1C 5.7 (H) 11/06/2021  ? HGBA1C 5.6 07/18/2021  ? HGBA1C 5.7 (H) 04/03/2021  ? HGBA1C 5.9 (H) 12/28/2020  ? HGBA1C 6.0 07/11/2020  ? ?Lab Results  ?Component Value Date  ? INSULIN 11.6 11/06/2021  ? INSULIN 10.9 07/18/2021  ? INSULIN 12.6 04/03/2021  ? INSULIN 11.3 12/28/2020  ? INSULIN 10.8 05/12/2019  ? ?Lab Results  ?Component Value Date  ? TSH 2.670 07/18/2021  ? ?Lab Results  ?Component Value Date  ? CHOL 185 11/06/2021  ? HDL  89 11/06/2021  ? [place] 87 11/06/2021  ? LDLDIRECT 158.6 08/18/2013  ? TRIG 45 11/06/2021  ? CHOLHDL 2 07/11/2020  ? ?Lab Results  ?Component Value Date  ? VD25OH 83.1 11/06/2021  ? VD25OH 49.9 07/18/2021  ? VD25OH 63.3 04/03/2021  ? ?Lab Results  ?Component Value Date  ? WBC 5.0 07/18/2021  ? HGB 14.0 07/18/2021  ? HCT 44.4 07/18/2021  ? MCV 87 07/18/2021  ? PLT 263 07/18/2021  ? ?No results found for: IRON, TIBC, FERRITIN ? ?Obesity Behavioral Intervention:  ? ?Approximately 15 minutes were spent on the discussion  below. ? ?ASK: ?We discussed the diagnosis of obesity with Jillian [name] today and Jillian [name] agreed to give [place] permission to discuss obesity behavioral modification therapy today. ? ?ASSESS: ?Jillian [name] has the diagnosis of obesity and her BMI today is 33.1. Jillian [name] is in the action stage of change.  ? ?ADVISE: ?Jillian [name] was educated on the multiple health risks of obesity as well as the benefit of weight loss to improve her health. She was advised of the need for long term treatment and the importance of lifestyle modifications to improve her current health and to decrease her risk of future health problems. ? ?AGREE: ?Multiple dietary modification options and treatment options were discussed and Jillian [name] agreed to follow the recommendations documented in the above note. ? ?ARRANGE: ?Jillian [name] was educated on the importance of frequent visits to treat obesity as outlined per CMS and USPSTF guidelines and agreed to schedule her next follow up appointment today. ? ?Attestation Statements:  ? ?Reviewed by clinician on day of visit: allergies, medications, problem list, medical history, surgical history, family history, social history, and previous encounter notes. ? ? ?I, Trixie Dredge, am acting as transcriptionist for Dennard Nip, MD. ? ?I have reviewed the above documentation for accuracy and completeness, and I agree with the above. -  Dennard Nip, MD ? ? ?

## 2022-01-01 ENCOUNTER — Ambulatory Visit (INDEPENDENT_AMBULATORY_CARE_PROVIDER_SITE_OTHER): Payer: Medicare Other | Admitting: Family Medicine

## 2022-01-01 ENCOUNTER — Encounter (INDEPENDENT_AMBULATORY_CARE_PROVIDER_SITE_OTHER): Payer: Self-pay | Admitting: Family Medicine

## 2022-01-01 VITALS — BP 150/81 | HR 94 | Temp 98.2°F | Ht 66.0 in | Wt 206.0 lb

## 2022-01-01 DIAGNOSIS — R7303 Prediabetes: Secondary | ICD-10-CM

## 2022-01-01 DIAGNOSIS — Z6833 Body mass index (BMI) 33.0-33.9, adult: Secondary | ICD-10-CM | POA: Diagnosis not present

## 2022-01-01 DIAGNOSIS — I10 Essential (primary) hypertension: Secondary | ICD-10-CM

## 2022-01-01 DIAGNOSIS — E669 Obesity, unspecified: Secondary | ICD-10-CM | POA: Diagnosis not present

## 2022-01-11 NOTE — Progress Notes (Signed)
Chief Complaint:   OBESITY Jillian [name] is here to discuss her progress with her obesity treatment plan along with follow-up of her obesity related diagnoses. Jillian [name] is on the Category 2 Plan and states she is following her eating plan approximately 70% of the time. Jillian [name] states she is doing 0 minutes 0 times per week.  Today's visit was #: 18 Starting weight: 223 lbs Starting date: 12/29/2019 Today's weight: 206 lbs Today's date: 01/01/2022 Total lbs lost to date: 17 Total lbs lost since last in-office visit: 0  Interim History: Jillian [name] is retaining some water weight. She has been on vacation and did some celebration eating for Mother's day and other celebration. She is working on getting back on track and restarting exercise now that her underarm biopsy has healed.   Subjective:   1. White coat syndrome with hypertension Jillian [name]'s blood pressure at home generally runs at 120's/70-80. Her blood pressure in the office today is elevated.   2. Pre-diabetes Jillian [name] is doing well with her diet and metformin. Last A1c was at 5.7.   Assessment/Plan:   1. White coat syndrome with hypertension Jillian [name] is to check her blood pressure at home and we will continue to follow.   2. Pre-diabetes Jillian [name] will continue with her diet, weight loss, and decreasing simple carbohydrates to help decrease the risk of diabetes.   3. Obesity, Current BMI 33.2 Jillian [name] is currently in the action stage of change. As such, her goal is to continue with weight loss efforts. She has agreed to the Category 2 Plan.   Exercise goals: All adults should avoid inactivity. Some physical activity is better than none, and adults who participate in any amount of physical activity gain some health benefits.  Behavioral modification strategies: increasing lean protein intake and meal planning and cooking strategies.  Jillian [name] has agreed to follow-up with our clinic in 3 weeks. She was informed of the importance of  frequent follow-up visits to maximize her success with intensive lifestyle modifications for her multiple health conditions.   Objective:   Blood pressure (!) 150/81, pulse 94, temperature 98.2 F (36.8 C), height '5\' 6"'$  (1.676 m), weight 206 lb (93.4 kg), SpO2 98 %. Body mass index is 33.25 kg/m.  General: Cooperative, alert, well developed, in no acute distress. HEENT: Conjunctivae and lids unremarkable. Cardiovascular: Regular rhythm.  Lungs: Normal work of breathing. Neurologic: No focal deficits.   Lab Results  Component Value Date   CREATININE 1.05 (H) 11/06/2021   BUN 14 11/06/2021   NA 142 11/06/2021   K 4.3 11/06/2021   CL 106 11/06/2021   CO2 23 11/06/2021   Lab Results  Component Value Date   ALT 17 11/06/2021   AST 19 11/06/2021   ALKPHOS 80 11/06/2021   BILITOT 0.3 11/06/2021   Lab Results  Component Value Date   HGBA1C 5.7 (H) 11/06/2021   HGBA1C 5.6 07/18/2021   HGBA1C 5.7 (H) 04/03/2021   HGBA1C 5.9 (H) 12/28/2020   HGBA1C 6.0 07/11/2020   Lab Results  Component Value Date   INSULIN 11.6 11/06/2021   INSULIN 10.9 07/18/2021   INSULIN 12.6 04/03/2021   INSULIN 11.3 12/28/2020   INSULIN 10.8 05/12/2019   Lab Results  Component Value Date   TSH 2.670 07/18/2021   Lab Results  Component Value Date   CHOL 185 11/06/2021   HDL 89 11/06/2021   LDLCALC 87 11/06/2021   LDLDIRECT 158.6 08/18/2013   TRIG 45 11/06/2021   CHOLHDL 2 07/11/2020   Lab  Results  Component Value Date   VD25OH 83.1 11/06/2021   VD25OH 49.9 07/18/2021   VD25OH 63.3 04/03/2021   Lab Results  Component Value Date   WBC 5.0 07/18/2021   HGB 14.0 07/18/2021   HCT 44.4 07/18/2021   MCV 87 07/18/2021   PLT 263 07/18/2021   No results found for: IRON, TIBC, FERRITIN  Attestation Statements:   Reviewed by clinician on day of visit: allergies, medications, problem list, medical history, surgical history, family history, social history, and previous encounter  notes.  Time spent on visit including pre-visit chart review and post-visit care and charting was 30 minutes.    I, Trixie Dredge, am acting as transcriptionist for Dennard Nip, MD.  I have reviewed the above documentation for accuracy and completeness, and I agree with the above. -  Dennard Nip, MD

## 2022-02-12 ENCOUNTER — Ambulatory Visit (INDEPENDENT_AMBULATORY_CARE_PROVIDER_SITE_OTHER): Payer: Medicare Other | Admitting: Family Medicine

## 2022-03-05 ENCOUNTER — Encounter (INDEPENDENT_AMBULATORY_CARE_PROVIDER_SITE_OTHER): Payer: Self-pay | Admitting: Family Medicine

## 2022-03-05 ENCOUNTER — Ambulatory Visit (INDEPENDENT_AMBULATORY_CARE_PROVIDER_SITE_OTHER): Payer: Medicare Other | Admitting: Family Medicine

## 2022-03-05 VITALS — BP 126/78 | HR 84 | Temp 97.8°F | Ht 66.0 in | Wt 205.0 lb

## 2022-03-05 DIAGNOSIS — E669 Obesity, unspecified: Secondary | ICD-10-CM

## 2022-03-05 DIAGNOSIS — R7303 Prediabetes: Secondary | ICD-10-CM | POA: Diagnosis not present

## 2022-03-05 DIAGNOSIS — E7849 Other hyperlipidemia: Secondary | ICD-10-CM | POA: Diagnosis not present

## 2022-03-05 DIAGNOSIS — E559 Vitamin D deficiency, unspecified: Secondary | ICD-10-CM | POA: Diagnosis not present

## 2022-03-05 DIAGNOSIS — Z6833 Body mass index (BMI) 33.0-33.9, adult: Secondary | ICD-10-CM

## 2022-03-05 MED ORDER — METFORMIN HCL 500 MG PO TABS: 500.0000 mg | ORAL_TABLET | Freq: Every day | ORAL | 0 refills | Status: DC

## 2022-03-06 LAB — CBC WITH DIFFERENTIAL/PLATELET
Basophils Absolute: 0.1 10*3/uL (ref 0.0–0.2)
Basos: 1 %
EOS (ABSOLUTE): 0.2 10*3/uL (ref 0.0–0.4)
Eos: 4 %
Hematocrit: 41.9 % (ref 34.0–46.6)
Hemoglobin: 13.5 g/dL (ref 11.1–15.9)
Immature Grans (Abs): 0 10*3/uL (ref 0.0–0.1)
Immature Granulocytes: 0 %
Lymphocytes Absolute: 1.8 10*3/uL (ref 0.7–3.1)
Lymphs: 42 %
MCH: 28.2 pg (ref 26.6–33.0)
MCHC: 32.2 g/dL (ref 31.5–35.7)
MCV: 88 fL (ref 79–97)
Monocytes Absolute: 0.4 10*3/uL (ref 0.1–0.9)
Monocytes: 9 %
Neutrophils Absolute: 1.9 10*3/uL (ref 1.4–7.0)
Neutrophils: 44 %
Platelets: 236 10*3/uL (ref 150–450)
RBC: 4.78 x10E6/uL (ref 3.77–5.28)
RDW: 12.8 % (ref 11.7–15.4)
WBC: 4.3 10*3/uL (ref 3.4–10.8)

## 2022-03-06 LAB — INSULIN, RANDOM: INSULIN: 7.8 u[IU]/mL (ref 2.6–24.9)

## 2022-03-06 LAB — CMP14+EGFR
ALT: 21 IU/L (ref 0–32)
AST: 24 IU/L (ref 0–40)
Albumin/Globulin Ratio: 1.7 (ref 1.2–2.2)
Albumin: 4.7 g/dL (ref 3.9–4.9)
Alkaline Phosphatase: 77 IU/L (ref 44–121)
BUN/Creatinine Ratio: 11 — ABNORMAL LOW (ref 12–28)
BUN: 11 mg/dL (ref 8–27)
Bilirubin Total: 0.4 mg/dL (ref 0.0–1.2)
CO2: 25 mmol/L (ref 20–29)
Calcium: 9.4 mg/dL (ref 8.7–10.3)
Chloride: 106 mmol/L (ref 96–106)
Creatinine, Ser: 0.98 mg/dL (ref 0.57–1.00)
Globulin, Total: 2.7 g/dL (ref 1.5–4.5)
Glucose: 93 mg/dL (ref 70–99)
Potassium: 4.6 mmol/L (ref 3.5–5.2)
Sodium: 142 mmol/L (ref 134–144)
Total Protein: 7.4 g/dL (ref 6.0–8.5)
eGFR: 64 mL/min/{1.73_m2} (ref >59)

## 2022-03-06 LAB — LIPID PANEL WITH LDL/HDL RATIO
Cholesterol, Total: 185 mg/dL (ref 100–199)
HDL: 95 mg/dL (ref >39)
LDL Chol Calc (NIH): 82 mg/dL (ref 0–99)
LDL/HDL Ratio: 0.9 ratio (ref 0.0–3.2)
Triglycerides: 40 mg/dL (ref 0–149)
VLDL Cholesterol Cal: 8 mg/dL (ref 5–40)

## 2022-03-06 LAB — HEMOGLOBIN A1C
Est. average glucose Bld gHb Est-mCnc: 114 mg/dL
Hgb A1c MFr Bld: 5.6 % (ref 4.8–5.6)

## 2022-03-06 LAB — VITAMIN D 25 HYDROXY (VIT D DEFICIENCY, FRACTURES): Vit D, 25-Hydroxy: 63.1 ng/mL (ref 30.0–100.0)

## 2022-03-06 NOTE — Progress Notes (Signed)
Chief Complaint:   OBESITY Dacia is here to discuss her progress with her obesity treatment plan along with follow-up of her obesity related diagnoses. Yaira is on the Category 2 Plan and states she is following her eating plan approximately 75% of the time. Takirah states she is walking for 30 minutes 3 times per week.  Today's visit was #: 24 Starting weight: 223 lbs Starting date: 12/29/2019 Today's weight: 205 lbs Today's date: 03/05/2022 Total lbs lost to date: 18 Total lbs lost since last in-office visit: 1  Interim History: Denay continues to do well with weight loss.  She feels lighter and her clothes are fitting more loosely.  Her hunger is controlled.  Subjective:   1. Other hyperlipidemia Undra is working on decreasing cholesterol in her diet, and she is due for labs.  2. Prediabetes Hilliary is doing well with decreasing simple carbohydrates and increasing her exercise.  3. Vitamin D deficiency Tahani's last vitamin D level was at goal.  She is at high risk of over replacement especially with weight loss.  Assessment/Plan:   1. Other hyperlipidemia We will check labs today. Tiffiny will continue to work on diet, exercise and weight loss efforts. Orders and follow up as documented in patient record.   - Lipid Panel With LDL/HDL Ratio  2. Prediabetes We will check labs today, and we will refill metformin for 1 month. Aza will continue to work on weight loss, exercise, and decreasing simple carbohydrates to help decrease the risk of diabetes.   - CMP14+EGFR - Insulin, random - Hemoglobin A1c - metFORMIN (GLUCOPHAGE) 500 MG tablet; Take 1 tablet (500 mg total) by mouth daily with breakfast. For blood sugar.  Dispense: 30 tablet; Refill: 0  3. Vitamin D deficiency We will check labs today.  Addisen will follow-up for routine testing of Vitamin D, at least 2-3 times per year to avoid over-replacement.  - CBC with Differential/Platelet - VITAMIN D  25 Hydroxy (Vit-D Deficiency, Fractures)  4. Obesity, Current BMI 33.2 Marcos is currently in the action stage of change. As such, her goal is to continue with weight loss efforts. She has agreed to the Category 2 Plan.   Exercise goals: As is.   Behavioral modification strategies: increasing lean protein intake.  Lashawne has agreed to follow-up with our clinic in 4 weeks. She was informed of the importance of frequent follow-up visits to maximize her success with intensive lifestyle modifications for her multiple health conditions.   Aldona was informed we would discuss her lab results at her next visit unless there is a critical issue that needs to be addressed sooner. Malini agreed to keep her next visit at the agreed upon time to discuss these results.  Objective:   Blood pressure 126/78, pulse 84, temperature 97.8 F (36.6 C), height _0  (1.676 m), weight 205 lb (93 kg), SpO2 100 %. Body mass index is 33.09 kg/m.  General: Cooperative, alert, well developed, in no acute distress. HEENT: Conjunctivae and lids unremarkable. Cardiovascular: Regular rhythm.  Lungs: Normal work of breathing. Neurologic: No focal deficits.   Lab Results  Component Value Date   CREATININE 0.98 03/05/2022   BUN 11 03/05/2022   NA 142 03/05/2022   K 4.6 03/05/2022   CL 106 03/05/2022   CO2 25 03/05/2022   Lab Results  Component Value Date   ALT 21 03/05/2022   AST 24 03/05/2022   ALKPHOS 77 03/05/2022   BILITOT 0.4 03/05/2022   Lab Results  Component  Value Date   HGBA1C 5.6 03/05/2022   HGBA1C 5.7 (H) 11/06/2021   HGBA1C 5.6 07/18/2021   HGBA1C 5.7 (H) 04/03/2021   HGBA1C 5.9 (H) 12/28/2020   Lab Results  Component Value Date   INSULIN 7.8 03/05/2022   INSULIN 11.6 11/06/2021   INSULIN 10.9 07/18/2021   INSULIN 12.6 04/03/2021   INSULIN 11.3 12/28/2020   Lab Results  Component Value Date   TSH 2.670 07/18/2021   Lab Results  Component Value Date   CHOL 185 03/05/2022    HDL 95 03/05/2022   LDLCALC 82 03/05/2022   LDLDIRECT 158.6 08/18/2013   TRIG 40 03/05/2022   CHOLHDL 2 07/11/2020   Lab Results  Component Value Date   VD25OH 63.1 03/05/2022   VD25OH 83.1 11/06/2021   VD25OH 49.9 07/18/2021   Lab Results  Component Value Date   WBC 4.3 03/05/2022   HGB 13.5 03/05/2022   HCT 41.9 03/05/2022   MCV 88 03/05/2022   PLT 236 03/05/2022   No results found for: "IRON", "TIBC", "FERRITIN"  Obesity Behavioral Intervention:   Approximately 15 minutes were spent on the discussion below.  ASK: We discussed the diagnosis of obesity with Samaya today and Dedria agreed to give [place] permission to discuss obesity behavioral modification therapy today.  ASSESS: Myrtice has the diagnosis of obesity and her BMI today is 33.2. Loghan is in the action stage of change.   ADVISE: Jamesina was educated on the multiple health risks of obesity as well as the benefit of weight loss to improve her health. She was advised of the need for long term treatment and the importance of lifestyle modifications to improve her current health and to decrease her risk of future health problems.  AGREE: Multiple dietary modification options and treatment options were discussed and Teliyah agreed to follow the recommendations documented in the above note.  ARRANGE: Ellia was educated on the importance of frequent visits to treat obesity as outlined per CMS and USPSTF guidelines and agreed to schedule her next follow up appointment today.  Attestation Statements:   Reviewed by clinician on day of visit: allergies, medications, problem list, medical history, surgical history, family history, social history, and previous encounter notes.   I, Trixie Dredge, am acting as transcriptionist for Dennard Nip, MD.  I have reviewed the above documentation for accuracy and completeness, and I agree with the above. -  Dennard Nip, MD

## 2022-03-28 ENCOUNTER — Encounter (INDEPENDENT_AMBULATORY_CARE_PROVIDER_SITE_OTHER): Payer: Self-pay

## 2022-04-02 ENCOUNTER — Encounter (INDEPENDENT_AMBULATORY_CARE_PROVIDER_SITE_OTHER): Payer: Self-pay | Admitting: Family Medicine

## 2022-04-02 ENCOUNTER — Ambulatory Visit (INDEPENDENT_AMBULATORY_CARE_PROVIDER_SITE_OTHER): Payer: Medicare Other | Admitting: Family Medicine

## 2022-04-02 VITALS — BP 137/86 | HR 75 | Temp 98.6°F | Ht 66.0 in | Wt 207.0 lb

## 2022-04-02 DIAGNOSIS — E7849 Other hyperlipidemia: Secondary | ICD-10-CM

## 2022-04-02 DIAGNOSIS — R7303 Prediabetes: Secondary | ICD-10-CM

## 2022-04-02 DIAGNOSIS — Z6833 Body mass index (BMI) 33.0-33.9, adult: Secondary | ICD-10-CM

## 2022-04-02 DIAGNOSIS — E669 Obesity, unspecified: Secondary | ICD-10-CM | POA: Diagnosis not present

## 2022-04-10 NOTE — Progress Notes (Signed)
Chief Complaint:   OBESITY Jillian [name] is here to discuss her progress with her obesity treatment plan along with follow-up of her obesity related diagnoses. Jillian [name] is on the Category 2 Plan and states she is following her eating plan approximately 65% of the time. Jillian [name] states she is walking for 30 minutes 3 times per week.  Today's visit was #: 20 Starting weight: 223 lbs Starting date: 12/29/2019 Today's weight: 207 lbs Today's date: 04/02/2022 Total lbs lost to date: 16 Total lbs lost since last in-office visit: 0  Interim History: Jillian [name] is retaining a bit of fluid today. She has increased walking, but her protein is likely decreasing. Her hunger is controlled.   Subjective:   1. Prediabetes Jillian [name]'s last A1c and insulin level have both improved with her diet and metformin. I discussed labs with the patient today.   2. Other hyperlipidemia Jillian [name]'s HDL and triglycerides have been very well controlled with her diet, but her LDL is now at goal with her diet and statin. I discussed labs with the patient today.  Assessment/Plan:   1. Prediabetes Jillian [name] will continue metformin and her diet, and we will continue to follow.   2. Other hyperlipidemia Jillian [name] will continue her diet and medications, and we will continue to monitor.   3. Obesity, Current BMI 33.4 Jillian [name] is currently in the action stage of change. As such, her goal is to continue with weight loss efforts. She has agreed to the Category 2 Plan and keeping a food journal and adhering to recommended goals of 400-600 calories and 40+ grams of protein supper daily.   Exercise goals: As is.   Behavioral modification strategies: increasing lean protein intake.  Jillian [name] has agreed to follow-up with our clinic in 4 weeks. She was informed of the importance of frequent follow-up visits to maximize her success with intensive lifestyle modifications for her multiple health conditions.   Objective:   Blood pressure  137/86, pulse 75, temperature 98.6 F (37 C), height '5\' 6"'$  (1.676 m), weight 207 lb (93.9 kg), SpO2 97 %. Body mass index is 33.41 kg/m.  General: Cooperative, alert, well developed, in no acute distress. HEENT: Conjunctivae and lids unremarkable. Cardiovascular: Regular rhythm.  Lungs: Normal work of breathing. Neurologic: No focal deficits.   Lab Results  Component Value Date   CREATININE 0.98 03/05/2022   BUN 11 03/05/2022   NA 142 03/05/2022   K 4.6 03/05/2022   CL 106 03/05/2022   CO2 25 03/05/2022   Lab Results  Component Value Date   ALT 21 03/05/2022   AST 24 03/05/2022   ALKPHOS 77 03/05/2022   BILITOT 0.4 03/05/2022   Lab Results  Component Value Date   HGBA1C 5.6 03/05/2022   HGBA1C 5.7 (H) 11/06/2021   HGBA1C 5.6 07/18/2021   HGBA1C 5.7 (H) 04/03/2021   HGBA1C 5.9 (H) 12/28/2020   Lab Results  Component Value Date   INSULIN 7.8 03/05/2022   INSULIN 11.6 11/06/2021   INSULIN 10.9 07/18/2021   INSULIN 12.6 04/03/2021   INSULIN 11.3 12/28/2020   Lab Results  Component Value Date   TSH 2.670 07/18/2021   Lab Results  Component Value Date   CHOL 185 03/05/2022   HDL 95 03/05/2022   LDLCALC 82 03/05/2022   LDLDIRECT 158.6 08/18/2013   TRIG 40 03/05/2022   CHOLHDL 2 07/11/2020   Lab Results  Component Value Date   VD25OH 63.1 03/05/2022   VD25OH 83.1 11/06/2021   VD25OH 49.9 07/18/2021   Lab  Results  Component Value Date   WBC 4.3 03/05/2022   HGB 13.5 03/05/2022   HCT 41.9 03/05/2022   MCV 88 03/05/2022   PLT 236 03/05/2022   No results found for: "IRON", "TIBC", "FERRITIN"  Attestation Statements:   Reviewed by clinician on day of visit: allergies, medications, problem list, medical history, surgical history, family history, social history, and previous encounter notes.  Time spent on visit including pre-visit chart review and post-visit care and charting was 33 minutes.   I, Trixie Dredge, am acting as transcriptionist for Dennard Nip, MD.  I have reviewed the above documentation for accuracy and completeness, and I agree with the above. -  Dennard Nip, MD

## 2022-04-26 ENCOUNTER — Ambulatory Visit (INDEPENDENT_AMBULATORY_CARE_PROVIDER_SITE_OTHER): Payer: Medicare Other | Admitting: Surgical

## 2022-04-26 ENCOUNTER — Ambulatory Visit (INDEPENDENT_AMBULATORY_CARE_PROVIDER_SITE_OTHER): Payer: Medicare Other

## 2022-04-26 DIAGNOSIS — M79645 Pain in left finger(s): Secondary | ICD-10-CM | POA: Diagnosis not present

## 2022-04-26 DIAGNOSIS — M20022 Boutonniere deformity of left finger(s): Secondary | ICD-10-CM

## 2022-04-26 DIAGNOSIS — M7742 Metatarsalgia, left foot: Secondary | ICD-10-CM

## 2022-04-26 DIAGNOSIS — M25572 Pain in left ankle and joints of left foot: Secondary | ICD-10-CM | POA: Diagnosis not present

## 2022-04-26 MED ORDER — MELOXICAM 15 MG PO TABS: 15.0000 mg | ORAL_TABLET | Freq: Every day | ORAL | 1 refills | Status: DC

## 2022-04-27 ENCOUNTER — Encounter: Payer: Self-pay | Admitting: Surgical

## 2022-04-27 NOTE — Progress Notes (Signed)
Office Visit Note   Patient: Jillian [name]           Date of Birth: March 24, 1956           MRN: [id number] Visit Date: 04/26/2022 Requested by: Pleas Koch, NP [place],  [place] [id number] PCP: Pleas Koch, NP  Subjective: Chief Complaint  Patient presents with   Left Foot - Pain    HPI: Jillian [name] is a 66 y.o. female who presents to the office complaining of left foot and left middle finger pain.  Patient complains of left foot pain that began last week without injury.  Localizes pain to the ball of her foot primarily between the second and fourth toes.  Bothers her with weightbearing.  She has been more active and going for long walks in the last couple weeks with her husband, generally walking around 15-16,000 steps per day.  She has tried taking meloxicam for the last 4 days and has had 50% improvement of her symptoms.  She feels things are heading in the right direction.  No numbness or tingling in the foot.  No sensation of stepping on a rock.  No bruising or ecchymosis that she is noted.  She also complains of left middle finger catching/getting stuck sensation that she has noticed over the last 3 to 5 months.  She states that it gets stuck to the point where she has to forcibly extend her finger with the other hand about once per week.  Localizes the majority of her pain to the PIP joint of the left middle finger.  She notes occasional clicking sensation in the finger in the morning.  Has difficulty with grip strength and finds her gripping with all the other fingers except her middle finger..                ROS: All systems reviewed are negative as they relate to the chief complaint within the history of present illness.  Patient denies fevers or chills.  Assessment & Plan: Visit Diagnoses:  1. Metatarsalgia, left foot   2. Pain in left ankle and joints of left foot   3. 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  Has left foot radiographs today and that are negative for any acute pathology.  Most of her pain is localized to the metatarsal heads of the second through fourth toes.  Impression is metatarsalgia with tight heel cord that may be contributing to this.  Patient was given prescription for meloxicam which is helping.  Also given a metatarsal pad to put in her shoe.  She will stretch her heel cord and see if all these modalities will help with her foot pain.  Overall it is improving already.  Regarding her left middle finger, she has had symptoms ongoing for about 3 to 5 months.  She has developed a boutonniere deformity without any history of significant injury that she can recall.  She has significant weakness of DIP flexion with PIP held in extension.  With this weakness along with her boutonniere deformity, concern for central slip injury.  Ordered MRI of the left middle finger for further evaluation.  Follow-up after MRI to review results.  Follow-Up Instructions: No follow-ups on file.   Orders:  Orders Placed This Encounter  Procedures  XR Foot Complete Left   XR Hand Complete Left   MR FINGERS LEFT W/O CM   Meds ordered this encounter  Medications   meloxicam (MOBIC) 15 MG tablet    Sig: Take 1 tablet (15 mg total) by mouth daily.    Dispense:  30 tablet    Refill:  1      Procedures: No procedures performed   Clinical Data: No additional findings.  Objective: Vital Signs: There were no vitals taken for this visit.  Physical Exam:  Constitutional: Patient appears well-developed HEENT:  Head: Normocephalic Eyes:EOM are normal Neck: Normal range of motion Cardiovascular: Normal rate Pulmonary/chest: Effort normal Neurologic: Patient is alert Skin: Skin is warm Psychiatric: Patient has normal mood and affect  Ortho Exam: Ortho exam  demonstrates left foot with no swelling or ecchymosis noted.  Palpable DP pulse of the left lower extremity.  Intact ankle dorsiflexion, plantarflexion, inversion, eversion.  She has no tenderness throughout the midfoot or hindfoot.  She does have only about 5 degrees of dorsiflexion with the knee flexed.  Tenderness over the second, third, fourth metatarsal heads moderately.  No tenderness over the metatarsal shafts.  No callus formation noted.  No pain with metatarsal compression from the medial lateral aspects of the foot.  Left middle finger with early boutonniere deformity.  She has no tenderness over the A1 pulley.  Does have tenderness over the volar aspect of the PIP joint.  Positive Boye's test with significant 3/5 weakness of DIP flexion with PIP held in extension.  No such weakness of the contralateral middle finger.  Specialty Comments:  No specialty comments available.  Imaging: No results found.   PMFS History: Patient Active Problem List   Diagnosis Date Noted   Other hyperlipidemia 04/02/2022   Sciatica 03/19/2021   Age-related nuclear cataract of both eyes 11/08/2020   Elevated blood pressure reading in office without diagnosis of hypertension 07/20/2020   Prediabetes 02/05/2019   Depression 02/05/2019   Dry skin 06/02/2018   Pelvic pain 02/19/2018   Chronic back pain 08/09/2017   Primary osteoarthritis of right shoulder 09/20/2016   Alopecia 01/10/2016   Obesity 09/20/2015   Preventative health care 09/08/2015   Former smoker 09/08/2014   HLD (hyperlipidemia) 09/01/2013   Vitamin D deficiency 07/03/2010   Vaginal atrophy 06/30/2009   Allergic rhinitis 03/01/2009   Past Medical History:  Diagnosis Date   Allergic rhinitis    Allergy    Arthritis    Arthritis of low back    Back pain    Cataract    Cataracts, bilateral    Constipation    Depression    Dry skin    on hands   Fibroid uterus    Hyperlipidemia    Joint pain    Osteoarthritis of right  shoulder    Pre-diabetes    Ringing in ears    Sciatica    sciatica   Vitamin D deficiency     Family History  Problem Relation Age of Onset   Colon cancer Sister    Colon polyps Sister    Diabetes Mother        father and 9 siblings   Kidney disease Mother        renal failure   Hypertension Mother    Hyperlipidemia Mother    Stroke Mother    Depression Mother    Anxiety disorder Mother    Obesity Mother    Heart failure Mother    Heart disease Father  paternal grandmother   Obesity Father    Diabetes Father    Hypertension Father    Hyperlipidemia Father    Eating disorder Father    Esophageal cancer Neg Hx    Rectal cancer Neg Hx    Stomach cancer Neg Hx     Past Surgical History:  Procedure Laterality Date   ABDOMINAL HYSTERECTOMY     broken ankle     CARPAL TUNNEL RELEASE Bilateral    CATARACT EXTRACTION W/ INTRAOCULAR LENS IMPLANT Bilateral 05/22/2021   2nd eye done 07/03/21   SHOULDER SURGERY Right    Social History   Occupational History   Occupation: Retired [place] [employer]  Tobacco Use   Smoking status: Former    Packs/day: 1.00    Years: 29.00    Total pack years: 29.00    Types: Cigarettes   Smokeless tobacco: Never   Tobacco comments:    30 years  Vaping Use   Vaping Use: Never used  Substance and Sexual Activity   Alcohol use: No   Drug use: No   Sexual activity: Not on file

## 2022-04-30 ENCOUNTER — Ambulatory Visit (INDEPENDENT_AMBULATORY_CARE_PROVIDER_SITE_OTHER): Payer: Medicare Other | Admitting: Family Medicine

## 2022-04-30 ENCOUNTER — Encounter (INDEPENDENT_AMBULATORY_CARE_PROVIDER_SITE_OTHER): Payer: Self-pay | Admitting: Family Medicine

## 2022-04-30 VITALS — BP 137/88 | HR 79 | Temp 98.0°F | Ht 66.0 in | Wt 208.0 lb

## 2022-04-30 DIAGNOSIS — E669 Obesity, unspecified: Secondary | ICD-10-CM | POA: Diagnosis not present

## 2022-04-30 DIAGNOSIS — Z6833 Body mass index (BMI) 33.0-33.9, adult: Secondary | ICD-10-CM | POA: Diagnosis not present

## 2022-04-30 DIAGNOSIS — R7303 Prediabetes: Secondary | ICD-10-CM

## 2022-04-30 MED ORDER — METFORMIN HCL 500 MG PO TABS: 500.0000 mg | ORAL_TABLET | Freq: Two times a day (BID) | ORAL | 0 refills | Status: DC

## 2022-04-30 MED ORDER — METFORMIN HCL 500 MG PO TABS: 500.0000 mg | ORAL_TABLET | Freq: Every day | ORAL | 0 refills | Status: DC

## 2022-05-04 NOTE — Progress Notes (Unsigned)
Chief Complaint:   OBESITY Jillian [name] is here to discuss her progress with her obesity treatment plan along with follow-up of her obesity related diagnoses. Jillian [name] is on the Category 2 Plan and keeping a food journal and adhering to recommended goals of 400-600 calories and 40+ grams of protein at supper daily and states she is following her eating plan approximately 75% of the time. Jillian [name] states she is doing 0 minutes 0 times per week.  Today's visit was #: 21 Starting weight: 223 lbs Starting date: 12/29/2019 Today's weight: 208 lbs Today's date: 04/30/2022 Total lbs lost to date: 15 Total lbs lost since last in-office visit: 0  Interim History: Jillian [name] exercise has been limited due to foot pain and her weight loss is delayed.  She is working on increasing protein and being mindful.  Subjective:   1. Prediabetes Jillian [name] is struggling with weight loss.  She has no side effects with metformin.  She has questions about metformin, and insulin resistance and prediabetes.  Assessment/Plan:   1. Prediabetes Jillian [name] agreed to increase metformin to 500 mg twice daily, and we will refill for 90 days. Jillian [name] will continue to work on weight loss, exercise, and decreasing simple carbohydrates to help decrease the risk of diabetes. Questions answered in depth today.  - metFORMIN (GLUCOPHAGE) 500 MG tablet; Take 1 tablet (500 mg total) by mouth 2 (two) times daily with a meal. For blood sugar.  Dispense: 180 tablet; Refill: 0  2. Obesity, Current BMI 33.6 Jillian [name] is currently in the action stage of change. As such, her goal is to continue with weight loss efforts. She has agreed to keeping a food journal and adhering to recommended goals of 1300-1500 calories and 85+ grams of protein daily.   Behavioral modification strategies: increasing lean protein intake.  Jillian [name] has agreed to follow-up with our clinic in 3 to 4 weeks. She was informed of the importance of frequent follow-up visits to  maximize her success with intensive lifestyle modifications for her multiple health conditions.   Objective:   Blood pressure 137/88, pulse 79, temperature 98 F (36.7 C), height '5\' 6"'$  (1.676 m), weight 208 lb (94.3 kg), SpO2 97 %. Body mass index is 33.57 kg/m.  General: Cooperative, alert, well developed, in no acute distress. HEENT: Conjunctivae and lids unremarkable. Cardiovascular: Regular rhythm.  Lungs: Normal work of breathing. Neurologic: No focal deficits.   Lab Results  Component Value Date   CREATININE 0.98 03/05/2022   BUN 11 03/05/2022   NA 142 03/05/2022   K 4.6 03/05/2022   CL 106 03/05/2022   CO2 25 03/05/2022   Lab Results  Component Value Date   ALT 21 03/05/2022   AST 24 03/05/2022   ALKPHOS 77 03/05/2022   BILITOT 0.4 03/05/2022   Lab Results  Component Value Date   HGBA1C 5.6 03/05/2022   HGBA1C 5.7 (H) 11/06/2021   HGBA1C 5.6 07/18/2021   HGBA1C 5.7 (H) 04/03/2021   HGBA1C 5.9 (H) 12/28/2020   Lab Results  Component Value Date   INSULIN 7.8 03/05/2022   INSULIN 11.6 11/06/2021   INSULIN 10.9 07/18/2021   INSULIN 12.6 04/03/2021   INSULIN 11.3 12/28/2020   Lab Results  Component Value Date   TSH 2.670 07/18/2021   Lab Results  Component Value Date   CHOL 185 03/05/2022   HDL 95 03/05/2022   LDLCALC 82 03/05/2022   LDLDIRECT 158.6 08/18/2013   TRIG 40 03/05/2022   CHOLHDL 2 07/11/2020   Lab Results  Component Value Date   VD25OH 63.1 03/05/2022   VD25OH 83.1 11/06/2021   VD25OH 49.9 07/18/2021   Lab Results  Component Value Date   WBC 4.3 03/05/2022   HGB 13.5 03/05/2022   HCT 41.9 03/05/2022   MCV 88 03/05/2022   PLT 236 03/05/2022   No results found for: "IRON", "TIBC", "FERRITIN"  Obesity Behavioral Intervention:   Approximately 15 minutes were spent on the discussion below.  ASK: We discussed the diagnosis of obesity with Jillian [name] today and Jillian [name] agreed to give [place] permission to discuss obesity behavioral  modification therapy today.  ASSESS: Jillian [name] has the diagnosis of obesity and her BMI today is 33.6. Jillian [name] is in the action stage of change.   ADVISE: Jillian [name] was educated on the multiple health risks of obesity as well as the benefit of weight loss to improve her health. She was advised of the need for long term treatment and the importance of lifestyle modifications to improve her current health and to decrease her risk of future health problems.  AGREE: Multiple dietary modification options and treatment options were discussed and Jillian [name] agreed to follow the recommendations documented in the above note.  ARRANGE: Jillian [name] was educated on the importance of frequent visits to treat obesity as outlined per CMS and USPSTF guidelines and agreed to schedule her next follow up appointment today.  Attestation Statements:   Reviewed by clinician on day of visit: allergies, medications, problem list, medical history, surgical history, family history, social history, and previous encounter notes.   I, Jillian [name], am acting as transcriptionist for Jillian Nip, MD.  I have reviewed the above documentation for accuracy and completeness, and I agree with the above. -  Jillian Nip, MD

## 2022-05-06 ENCOUNTER — Ambulatory Visit
Admission: RE | Admit: 2022-05-06 | Discharge: 2022-05-06 | Disposition: A | Discharge status: Home / Self Care | Payer: Medicare Other | Source: Ambulatory Visit | Attending: Surgical | Admitting: Surgical

## 2022-05-06 DIAGNOSIS — M79645 Pain in left finger(s): Secondary | ICD-10-CM

## 2022-05-17 NOTE — Progress Notes (Signed)
Needs follow-up with dr dean

## 2022-05-18 ENCOUNTER — Telehealth: Payer: Self-pay

## 2022-05-18 NOTE — Telephone Encounter (Signed)
-----   Message from Donella Stade, PA-C sent at 05/17/2022  5:24 PM EDT ----- Needs followup with dr dean

## 2022-05-18 NOTE — Telephone Encounter (Signed)
IC LMVM for patient to call us back to schedule appt for MRI review

## 2022-05-22 ENCOUNTER — Ambulatory Visit (INDEPENDENT_AMBULATORY_CARE_PROVIDER_SITE_OTHER): Payer: Medicare Other

## 2022-05-22 DIAGNOSIS — Z23 Encounter for immunization: Secondary | ICD-10-CM | POA: Diagnosis not present

## 2022-05-25 ENCOUNTER — Telehealth: Payer: Self-pay | Admitting: Orthopedic Surgery

## 2022-05-25 NOTE — Telephone Encounter (Signed)
Patient called asked if she can be worked into Dr. Randel Pigg schedule for a sooner appointment due to the pain she is having. The number to contact patient is [phone].

## 2022-05-25 NOTE — Telephone Encounter (Signed)
Tried calling patient back. No answer.

## 2022-05-28 ENCOUNTER — Ambulatory Visit (INDEPENDENT_AMBULATORY_CARE_PROVIDER_SITE_OTHER): Payer: Medicare Other | Admitting: Family Medicine

## 2022-06-08 ENCOUNTER — Ambulatory Visit (INDEPENDENT_AMBULATORY_CARE_PROVIDER_SITE_OTHER): Payer: Medicare Other | Admitting: Orthopedic Surgery

## 2022-06-08 DIAGNOSIS — M79645 Pain in left finger(s): Secondary | ICD-10-CM | POA: Diagnosis not present

## 2022-06-10 ENCOUNTER — Encounter: Payer: Self-pay | Admitting: Orthopedic Surgery

## 2022-06-10 NOTE — Progress Notes (Signed)
Office Visit Note   Patient: Jillian [name]           Date of Birth: [birth year]           MRN: [id number] Visit Date: 06/08/2022 Requested by: Pleas Koch, NP [place],  [place] [id number] PCP: Pleas Koch, NP  Subjective: Chief Complaint  Patient presents with   Other     Scan review    HPI: Jillian [name] is a 66 y.o. female who presents to the office reporting left foot metatarsal pain as well as left long finger catching and flexion deformity at the PIP joint.  She denies a history of injury to the left hand or finger.  Since she has last been seen MRI scan has been performed which does show some fluid around the PIP joint and mild tendinosis of the third flexor digitorum profundus and superficialis tendons with mild tenosynovitis.  Some of that tenosynovial fluid is more focally located at the level of the third middle phalanx.  Not too much signal around the A1 pulley.  She also reports left metatarsal pain but obtained an insert from [facility] which was much better than the one provided here at the last clinic visit.  Symptoms do increase in the foot with walking.  Denies any numbness and tingling in the hands or fingers..                ROS: All systems reviewed are negative as they relate to the chief complaint within the history of present illness.  Patient denies fevers or chills.  Assessment & Plan: Visit Diagnoses:  1. Pain in left finger(s)     Plan: Impression is left long finger locking with no significant tenderness over the A1 pulley.  She does have most of her tenderness over the PIP joint.  There is some focal swelling and tendinitis and tenosynovitis in this region.  Central slip intact on the MRI scan.  Overall she really cannot straighten that finger is much as she can on the right-hand side.  Plan is refer to occupational therapy for PIP extension splint to be worn at night.  Regarding the left foot I do not think there is  really an intervention to be done there.  The brace she got from [facility] is more functional and better brace than what she has from here.  She will follow-up as needed.  Follow-Up Instructions: No follow-ups on file.   Orders:  Orders Placed This Encounter  Procedures   Ambulatory referral to Occupational Therapy   No orders of the defined types were placed in this encounter.     Procedures: No procedures performed   Clinical Data: No additional findings.  Objective: Vital Signs: There were no vitals taken for this visit.  Physical Exam:  Constitutional: Patient appears well-developed HEENT:  Head: Normocephalic Eyes:EOM are normal Neck: Normal range of motion Cardiovascular: Normal rate Pulmonary/chest: Effort normal Neurologic: Patient is alert Skin: Skin is warm Psychiatric: Patient has normal mood and affect  Ortho Exam: Ortho exam demonstrates normal gait alignment.  She has palpable pedal pulses.  No real lesser toe deformities on the left-hand side.  Palpable intact nontender anterior to posterior to peroneal and Achilles tendons.  Ankle dorsiflexion plantarflexion strength intact.  No masses lymphadenopathy or skin changes noted in the foot region.  Symmetric tibiotalar subtalar transverse tarsal range of motion.  Mild tenderness on the plantar aspect of the metatarsal heads #2 3 and 4.  Left hand is examined.  She does have correctable flexion deformity at the PIP joint left third finger with no real tenderness at the A1 pulley.  Flexion extension of the third finger completely and fully intact.  EPL FPL interosseous strength intact.  Radial pulse intact.  No masses lymphadenopathy or skin changes noted in that left hand region.  She can achieve full extension strength at the PIP joint against resistance.  Specialty Comments:  No specialty comments available.  Imaging: No results found.   PMFS History: Patient Active Problem List   Diagnosis Date Noted    Other hyperlipidemia 04/02/2022   Sciatica 03/19/2021   Age-related nuclear cataract of both eyes 11/08/2020   Elevated blood pressure reading in office without diagnosis of hypertension 07/20/2020   Prediabetes 02/05/2019   Depression 02/05/2019   Dry skin 06/02/2018   Pelvic pain 02/19/2018   Chronic back pain 08/09/2017   Primary osteoarthritis of right shoulder 09/20/2016   Alopecia 01/10/2016   Class 1 obesity with serious comorbidity and body mass index (BMI) of 34.0 to 34.9 in adult 09/20/2015   Preventative health care 09/08/2015   Former smoker 09/08/2014   HLD (hyperlipidemia) 09/01/2013   Vitamin D deficiency 07/03/2010   Vaginal atrophy 06/30/2009   Allergic rhinitis 03/01/2009   Past Medical History:  Diagnosis Date   Allergic rhinitis    Allergy    Arthritis    Arthritis of low back    Back pain    Cataract    Cataracts, bilateral    Constipation    Depression    Dry skin    on hands   Fibroid uterus    Hyperlipidemia    Joint pain    Osteoarthritis of right shoulder    Pre-diabetes    Ringing in ears    Sciatica    sciatica   Vitamin D deficiency     Family History  Problem Relation Age of Onset   Colon cancer Sister    Colon polyps Sister    Diabetes Mother        father and 9 siblings   Kidney disease Mother        renal failure   Hypertension Mother    Hyperlipidemia Mother    Stroke Mother    Depression Mother    Anxiety disorder Mother    Obesity Mother    Heart failure Mother    Heart disease Father        paternal grandmother   Obesity Father    Diabetes Father    Hypertension Father    Hyperlipidemia Father    Eating disorder Father    Esophageal cancer Neg Hx    Rectal cancer Neg Hx    Stomach cancer Neg Hx     Past Surgical History:  Procedure Laterality Date   ABDOMINAL HYSTERECTOMY     broken ankle     CARPAL TUNNEL RELEASE Bilateral    CATARACT EXTRACTION W/ INTRAOCULAR LENS IMPLANT Bilateral 05/22/2021   2nd eye  done 07/03/21   SHOULDER SURGERY Right    Social History   Occupational History   Occupation: Retired [employer]  Tobacco Use   Smoking status: Former    Packs/day: 1.00    Years: 29.00    Total pack years: 29.00    Types: Cigarettes   Smokeless tobacco: Never   Tobacco comments:    30 years  Vaping Use   Vaping Use: Never used  Substance and Sexual Activity   Alcohol  use: No   Drug use: No   Sexual activity: Not on file

## 2022-06-11 NOTE — Therapy (Signed)
OUTPATIENT OCCUPATIONAL THERAPY ORTHO EVALUATION  Patient Name: Jillian [name] MRN: [id number] DOB:26-Jan-1956, 66 y.o., female Today's Date: 06/12/2022  PCP: Alma Friendly, NP REFERRING PROVIDER: Meredith Pel, MD   OT End of Session - 06/12/22 1424     Visit Number 1    Number of Visits 8    Date for OT Re-Evaluation 08/10/22    Authorization Type Medicare A & B    Progress Note Due on Visit 10    OT Start Time 1427    OT Stop Time 1518    OT Time Calculation (min) 51 min    Equipment Utilized During Treatment orthotic materials    Activity Tolerance Patient tolerated treatment well;No increased pain;Patient limited by pain;Patient limited by fatigue    Behavior During Therapy [facility] for tasks assessed/performed             Past Medical History:  Diagnosis Date   Allergic rhinitis    Allergy    Arthritis    Arthritis of low back    Back pain    Cataract    Cataracts, bilateral    Constipation    Depression    Dry skin    on hands   Fibroid uterus    Hyperlipidemia    Joint pain    Osteoarthritis of right shoulder    Pre-diabetes    Ringing in ears    Sciatica    sciatica   Vitamin D deficiency    Past Surgical History:  Procedure Laterality Date   ABDOMINAL HYSTERECTOMY     broken ankle     CARPAL TUNNEL RELEASE Bilateral    CATARACT EXTRACTION W/ INTRAOCULAR LENS IMPLANT Bilateral 05/22/2021   2nd eye done 07/03/21   SHOULDER SURGERY Right    Patient Active Problem List   Diagnosis Date Noted   Other hyperlipidemia 04/02/2022   Sciatica 03/19/2021   Age-related nuclear cataract of both eyes 11/08/2020   Elevated blood pressure reading in office without diagnosis of hypertension 07/20/2020   Prediabetes 02/05/2019   Depression 02/05/2019   Dry skin 06/02/2018   Pelvic pain 02/19/2018   Chronic back pain 08/09/2017   Primary osteoarthritis of right shoulder 09/20/2016   Alopecia 01/10/2016   Class 1 obesity with serious  comorbidity and body mass index (BMI) of 34.0 to 34.9 in adult 09/20/2015   Preventative health care 09/08/2015   Former smoker 09/08/2014   HLD (hyperlipidemia) 09/01/2013   Vitamin D deficiency 07/03/2010   Vaginal atrophy 06/30/2009   Allergic rhinitis 03/01/2009    ONSET DATE: Onset 1.5 - 2 months ago   REFERRING DIAG: M79.645 (ICD-10-CM) - Pain in left finger(s)  THERAPY DIAG:  Pain in joint of left hand - Plan: Ot plan of care cert/re-cert  Muscle weakness (generalized) - Plan: Ot plan of care cert/re-cert  Other lack of coordination - Plan: Ot plan of care cert/re-cert  Stiffness of left hand, not elsewhere classified - Plan: Ot plan of care cert/re-cert  Rationale for Evaluation and Treatment Rehabilitation  SUBJECTIVE:   SUBJECTIVE STATEMENT: 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 She states about 6-8 weeks ago her Lt MF started locking in the morning. She had to forcefully pull it straight, which hurt.  Pain was getting worse, so she started avoiding use with MF and lets it "stick out straight." Pain is less since, but it is more stiff now.    PERTINENT HISTORY: Per MD note: "PIP extensor splint to be  worn at night"  PRECAUTIONS: None  WEIGHT BEARING RESTRICTIONS No  PAIN:  Are you having pain? No Rating: 0/10 at rest now, up to 8/10 in mornings   FALLS: Has patient fallen in last 6 months? No  LIVING ENVIRONMENT: Lives with: lives with their family  PLOF: Independent  PATIENT GOALS Get full use of Lt hand without pain   OBJECTIVE: (All objective assessments below are from initial evaluation on: 06/12/22 unless otherwise specified.)    HAND DOMINANCE: Right   ADLs: Overall ADLs: States decreased ability to grab, hold household objects, pain and inability to open containers   FUNCTIONAL OUTCOME MEASURES: Eval: Patient Specific Functional Scale: 5 (wringing out cloth, wash dishes, raking)  (Higher Score  =  Better Ability  for the Selected Tasks)      UPPER EXTREMITY ROM     Hand AROM Left eval  Full Fist Ability (or Gap to Distal Palmar Crease) Full fist, but a bit tight and painful in MF   Long Finger MCP (0-90)  Approx 0-80  Long PIP J (0-100) Approx (-20) - 75   Long DIP (0-70) Approx 0-30   (Blank rows = not tested)   HAND FUNCTION: Eval: Observed weakness and pain in affected LT hand.  TBD grip strength when better tolerated  Grip strength Right: TBD lbs, Left: TBD lbs   COORDINATION: Eval: Observed coordination impairments with affected Lt hand. Details TBD 9 Hole Peg Test Left: TBD sec   SENSATION: Eval:  Light touch intact today  EDEMA:   Eval: None significant  COGNITION: Eval: Overall cognitive status: WFL for evaluation today   OBSERVATIONS:   Eval: No visible Lt MF swelling today, but TTP to P2 and PIP J. Likely triggering at A2    TODAY'S TREATMENT:  Post-evaluation treatment: Custom orthotic fabrication was indicated due to pt's triggering/painful Lt MF and need for safe, functional positioning. OT fabricated custom PIP J ext orthotic for pt today to rest/prevent triggering. It fit well with no areas of pressure, pt states a comfortable fit. Pt was educated on the wearing schedule (all night and in day to rest as well), to call or come in ASAP if it is causing any irritation or is not achieving desired function. It will be checked/adjusted in upcoming sessions, as needed. Pt states understanding. She was unable to trigger or cause significant pain with hand motion while wearing it.    OT also edu on body mechanics, mechanism of injury and avoiding strong grip, repetitive work.  OT also edu on self-massage, icing to relieve pain and trigger as well as 2-5 mins heat to help manage stiffness. She states understanding.   PATIENT EDUCATION: Education details: See tx section above for details  Person educated: Patient Education method: Verbal Instruction, Teach back, Handouts   Education comprehension: States and demonstrates understanding, Additional Education required    HOME EXERCISE PROGRAM: See tx section above for details    GOALS: Goals reviewed with patient? Yes   SHORT TERM GOALS: (STG required if POC>30 days) Target Date: 06/29/22  Pt will obtain protective, custom orthotic. Goal status: MET   2.  Pt will demo/state understanding of initial HEP to improve pain levels and prerequisite motion. Goal status: INITIAL   LONG TERM GOALS: Target Date: 08/10/22  Pt will improve functional ability by decreased impairment per Quick PSFS assessment from 5 to 8 or better, for better quality of life. Goal status: INITIAL  2.  Pt will improve grip strength in Lt hand  to at least 30lbs no pain, for functional use at home and in IADLs. Goal status: INITIAL  3.  Pt will improve A/ROM in Lt MF TAM from 165* to at least 200*, to have functional motion for tasks like reach and grasp.  Goal status: INITIAL  4.  Pt will improve coordination skills in Lt hand, as seen by better score on 9HPT testing to at least [facility], to have increased functional ability to carry out fine motor tasks (fasteners, etc.) and more complex, coordinated IADLs (meal prep, sports, etc.).  Goal status: INITIAL  6.  Pt will decrease pain at worst (night/morning) from 8/10 to 2/10 or better to have better sleep and occupational participation in daily roles. Goal status: INITIAL    ASSESSMENT:  CLINICAL IMPRESSION: Patient is a 66 y.o. female who was seen today for occupational therapy evaluation for Lt MF pain, triggering and decreased ability. She will benefit from OP OT to improve fnl status.   PERFORMANCE DEFICITS in functional skills including ADLs, IADLs, coordination, dexterity, edema, ROM, strength, pain, fascial restrictions, flexibility, Fine motor control, body mechanics, endurance, decreased knowledge of precautions, and UE functional use, cognitive skills including safety  awareness, and psychosocial skills including coping strategies, environmental adaptation, and habits.   IMPAIRMENTS are limiting patient from ADLs, IADLs, work, and leisure.   COMORBIDITIES may have co-morbidities  that affects occupational performance. Patient will benefit from skilled OT to address above impairments and improve overall function.  MODIFICATION OR ASSISTANCE TO COMPLETE EVALUATION: No modification of tasks or assist necessary to complete an evaluation.  OT OCCUPATIONAL PROFILE AND HISTORY: Problem focused assessment: Including review of records relating to presenting problem.  CLINICAL DECISION MAKING: LOW - limited treatment options, no task modification necessary  REHAB POTENTIAL: Excellent  EVALUATION COMPLEXITY: Low     PLAN: OT FREQUENCY: 1-2x/week  OT DURATION: 8 weeks (through 08/10/22 as medically needed)  PLANNED INTERVENTIONS: self care/ADL training, therapeutic exercise, therapeutic activity, manual therapy, passive range of motion, splinting, ultrasound, moist heat, cryotherapy, contrast bath, patient/family education, and coping strategies training  RECOMMENDED OTHER SERVICES: none now   CONSULTED AND AGREED WITH PLAN OF CARE: Patient  PLAN FOR NEXT SESSION: Check orthotic and measures, check tolerance to motion, add HEP as tolerated and eventually stretch and strength in hand as needed to hit goals.    Benito Mccreedy, OTR/L, CHT 06/12/2022, 3:42 PM

## 2022-06-12 ENCOUNTER — Other Ambulatory Visit: Payer: Self-pay

## 2022-06-12 ENCOUNTER — Ambulatory Visit (INDEPENDENT_AMBULATORY_CARE_PROVIDER_SITE_OTHER): Payer: Medicare Other | Admitting: Rehabilitative and Restorative Service Providers"

## 2022-06-12 ENCOUNTER — Encounter: Payer: Self-pay | Admitting: Rehabilitative and Restorative Service Providers"

## 2022-06-12 DIAGNOSIS — M25542 Pain in joints of left hand: Secondary | ICD-10-CM | POA: Diagnosis not present

## 2022-06-12 DIAGNOSIS — M6281 Muscle weakness (generalized): Secondary | ICD-10-CM

## 2022-06-12 DIAGNOSIS — M25642 Stiffness of left hand, not elsewhere classified: Secondary | ICD-10-CM

## 2022-06-12 DIAGNOSIS — R278 Other lack of coordination: Secondary | ICD-10-CM

## 2022-06-19 ENCOUNTER — Ambulatory Visit (INDEPENDENT_AMBULATORY_CARE_PROVIDER_SITE_OTHER): Payer: Medicare Other | Admitting: Rehabilitative and Restorative Service Providers"

## 2022-06-19 ENCOUNTER — Encounter: Payer: Self-pay | Admitting: Rehabilitative and Restorative Service Providers"

## 2022-06-19 DIAGNOSIS — M25542 Pain in joints of left hand: Secondary | ICD-10-CM | POA: Diagnosis not present

## 2022-06-19 DIAGNOSIS — M25642 Stiffness of left hand, not elsewhere classified: Secondary | ICD-10-CM

## 2022-06-19 DIAGNOSIS — R278 Other lack of coordination: Secondary | ICD-10-CM | POA: Diagnosis not present

## 2022-06-19 DIAGNOSIS — M6281 Muscle weakness (generalized): Secondary | ICD-10-CM | POA: Diagnosis not present

## 2022-06-19 NOTE — Therapy (Signed)
OUTPATIENT OCCUPATIONAL THERAPY TREATMENT NOTE  Patient Name: Jillian [name] MRN: [id number] DOB:[birth year], 66 y.o., female Today's Date: 06/19/2022  PCP: Alma Friendly, NP REFERRING PROVIDER: Meredith Pel, MD   OT End of Session - 06/19/22 1611     Visit Number 2    Number of Visits 8    Date for OT Re-Evaluation 08/10/22    Authorization Type Medicare A & B    Progress Note Due on Visit 10    OT Start Time 1611    OT Stop Time 1645    OT Time Calculation (min) 34 min    Equipment Utilized During Treatment orthotic materials    Activity Tolerance Patient tolerated treatment well;No increased pain;Patient limited by pain;Patient limited by fatigue    Behavior During Therapy [facility] for tasks assessed/performed              Past Medical History:  Diagnosis Date   Allergic rhinitis    Allergy    Arthritis    Arthritis of low back    Back pain    Cataract    Cataracts, bilateral    Constipation    Depression    Dry skin    on hands   Fibroid uterus    Hyperlipidemia    Joint pain    Osteoarthritis of right shoulder    Pre-diabetes    Ringing in ears    Sciatica    sciatica   Vitamin D deficiency    Past Surgical History:  Procedure Laterality Date   ABDOMINAL HYSTERECTOMY     broken ankle     CARPAL TUNNEL RELEASE Bilateral    CATARACT EXTRACTION W/ INTRAOCULAR LENS IMPLANT Bilateral 05/22/2021   2nd eye done 07/03/21   SHOULDER SURGERY Right    Patient Active Problem List   Diagnosis Date Noted   Other hyperlipidemia 04/02/2022   Sciatica 03/19/2021   Age-related nuclear cataract of both eyes 11/08/2020   Elevated blood pressure reading in office without diagnosis of hypertension 07/20/2020   Prediabetes 02/05/2019   Depression 02/05/2019   Dry skin 06/02/2018   Pelvic pain 02/19/2018   Chronic back pain 08/09/2017   Primary osteoarthritis of right shoulder 09/20/2016   Alopecia 01/10/2016   Class 1 obesity with serious  comorbidity and body mass index (BMI) of 34.0 to 34.9 in adult 09/20/2015   Preventative health care 09/08/2015   Former smoker 09/08/2014   HLD (hyperlipidemia) 09/01/2013   Vitamin D deficiency 07/03/2010   Vaginal atrophy 06/30/2009   Allergic rhinitis 03/01/2009    ONSET DATE: Onset 1.5 - 2 months ago   REFERRING DIAG: M79.645 (ICD-10-CM) - Pain in left finger(s)  THERAPY DIAG:  Pain in joint of left hand  Muscle weakness (generalized)  Other lack of coordination  Stiffness of left hand, not elsewhere classified  Rationale for Evaluation and Treatment Rehabilitation  PERTINENT HISTORY: Per MD note: "PIP extensor splint to be worn at night"  PRECAUTIONS: None  WEIGHT BEARING RESTRICTIONS No   SUBJECTIVE:   SUBJECTIVE STATEMENT: She states the day orthotic does stop her triggering, but she though she was supposed to be making a fist and exercising each morning. Doing that was making her trigger and causing problems.     PAIN:  Are you having pain? Yes Rating: 1-2/10 at rest now, up to 8/10 in mornings    PATIENT GOALS Get full use of Lt hand without pain   OBJECTIVE: (All objective assessments below are from initial evaluation on: 06/12/22 unless  otherwise specified.)    HAND DOMINANCE: Right   ADLs: Overall ADLs: States decreased ability to grab, hold household objects, pain and inability to open containers   FUNCTIONAL OUTCOME MEASURES: Eval: Patient Specific Functional Scale: 5 (wringing out cloth, wash dishes, raking)  (Higher Score  =  Better Ability for the Selected Tasks)      UPPER EXTREMITY ROM     Hand AROM Left eval  Full Fist Ability (or Gap to Distal Palmar Crease) Full fist, but a bit tight and painful in MF   Long Finger MCP (0-90)  Approx 0-80  Long PIP J (0-100) Approx (-20) - 75   Long DIP (0-70) Approx 0-30   (Blank rows = not tested)   HAND FUNCTION: Eval: Observed weakness and pain in affected LT hand.  TBD grip strength  when better tolerated  Grip strength Right: TBD lbs, Left: TBD lbs   COORDINATION: Eval: Observed coordination impairments with affected Lt hand. Details TBD 9 Hole Peg Test Left: TBD sec   SENSATION: Eval:  Light touch intact today  EDEMA:   Eval: None significant  COGNITION: Eval: Overall cognitive status: WFL for evaluation today   OBSERVATIONS:   06/19/22: at this point in the day, she is triggering and shows me that.   Eval: No visible Lt MF swelling today, but TTP to P2 and PIP J. Likely triggering at A2 but no triggering at eval   TODAY'S TREATMENT:  06/19/22: OT clarifies she is NOT to trigger at all, if possible. She is to wear day orthotic as much as possible, and remove only for the following new exercises that were shown today (as below), to help manage stiffness.  OT also fabricates hand-based night orthotic that provides full finger extension stretch. This fits well, feels stretch, no pain. She was edu to take this off if it is hurting her or causing problems and bring in for adjustment. She states understanding and demo's stretches well. No added pain or triggering in session (after she demo'd at start of session).   Exercises - Wrist Flexion Stretch  - 2-4 x daily - 3 reps - 15 sec hold - Wrist Prayer Stretch  - 3-4 x daily - 3 reps - 15 sec hold - PUSH KNUCKLES DOWN  - 2- 4 x daily - 3 reps - 15 seconds hold    Post-evaluation treatment: Custom orthotic fabrication was indicated due to pt's triggering/painful Lt MF and need for safe, functional positioning. OT fabricated custom PIP J ext orthotic for pt today to rest/prevent triggering. It fit well with no areas of pressure, pt states a comfortable fit. Pt was educated on the wearing schedule (all night and in day to rest as well), to call or come in ASAP if it is causing any irritation or is not achieving desired function. It will be checked/adjusted in upcoming sessions, as needed. Pt states understanding. She was  unable to trigger or cause significant pain with hand motion while wearing it.    OT also edu on body mechanics, mechanism of injury and avoiding strong grip, repetitive work.  OT also edu on self-massage, icing to relieve pain and trigger as well as 2-5 mins heat to help manage stiffness. She states understanding.   PATIENT EDUCATION: Education details: See tx section above for details  Person educated: Patient Education method: Verbal Instruction, Teach back, Handouts  Education comprehension: States and demonstrates understanding, Additional Education required    HOME EXERCISE PROGRAM: Access Code: [email or web address] URL: [email or web address] Date:  06/19/2022 Prepared by: Benito Mccreedy  GOALS: Goals reviewed with patient? Yes   SHORT TERM GOALS: (STG required if POC>30 days) Target Date: 06/29/22  Pt will obtain protective, custom orthotic. Goal status: MET   2.  Pt will demo/state understanding of initial HEP to improve pain levels and prerequisite motion. Goal status: Progressing    LONG TERM GOALS: Target Date: 08/10/22  Pt will improve functional ability by decreased impairment per Quick PSFS assessment from 5 to 8 or better, for better quality of life. Goal status: INITIAL  2.  Pt will improve grip strength in Lt hand to at least 30lbs no pain, for functional use at home and in IADLs. Goal status: INITIAL  3.  Pt will improve A/ROM in Lt MF TAM from 165* to at least 200*, to have functional motion for tasks like reach and grasp.  Goal status: INITIAL  4.  Pt will improve coordination skills in Lt hand, as seen by better score on 9HPT testing to at least [facility], to have increased functional ability to carry out fine motor tasks (fasteners, etc.) and more complex, coordinated IADLs (meal prep, sports, etc.).  Goal status: INITIAL  6.  Pt will decrease pain at worst (night/morning) from 8/10 to 2/10 or better to have better sleep and occupational participation  in daily roles. Goal status: INITIAL    ASSESSMENT:  CLINICAL IMPRESSION: 06/19/22: She now has night ext/stretch orthotic as well as low-profile, finger based day time orthotic to prevent trigger. We need to prevent trigger for possibly 3-6 weeks, to allow rest, then work on ROM and hand strength gently.   Eval: Patient is a 66 y.o. female who was seen today for occupational therapy evaluation for Lt MF pain, triggering and decreased ability. She will benefit from OP OT to improve fnl status.     PLAN: OT FREQUENCY: 1-2x/week  OT DURATION: 8 weeks (through 08/10/22 as medically needed)  PLANNED INTERVENTIONS: self care/ADL training, therapeutic exercise, therapeutic activity, manual therapy, passive range of motion, splinting, ultrasound, moist heat, cryotherapy, contrast bath, patient/family education, and coping strategies training  RECOMMENDED OTHER SERVICES: none now   CONSULTED AND AGREED WITH PLAN OF CARE: Patient  PLAN FOR NEXT SESSION:  F/u next week to check orthotics, understanding HEP and POC. Ensure no trigger, do massages, ice, etc. Can also use [place] treatment possibly.   Benito Mccreedy, OTR/L, CHT 06/19/2022, 5:11 PM

## 2022-06-21 ENCOUNTER — Ambulatory Visit (INDEPENDENT_AMBULATORY_CARE_PROVIDER_SITE_OTHER): Payer: Medicare Other | Admitting: Family Medicine

## 2022-06-21 ENCOUNTER — Encounter (INDEPENDENT_AMBULATORY_CARE_PROVIDER_SITE_OTHER): Payer: Self-pay | Admitting: Family Medicine

## 2022-06-21 VITALS — BP 145/80 | HR 73 | Temp 98.0°F | Ht 66.0 in | Wt 204.0 lb

## 2022-06-21 DIAGNOSIS — E559 Vitamin D deficiency, unspecified: Secondary | ICD-10-CM

## 2022-06-21 DIAGNOSIS — E669 Obesity, unspecified: Secondary | ICD-10-CM | POA: Diagnosis not present

## 2022-06-21 DIAGNOSIS — R7303 Prediabetes: Secondary | ICD-10-CM | POA: Diagnosis not present

## 2022-06-21 DIAGNOSIS — Z6833 Body mass index (BMI) 33.0-33.9, adult: Secondary | ICD-10-CM

## 2022-06-21 DIAGNOSIS — I10 Essential (primary) hypertension: Secondary | ICD-10-CM | POA: Insufficient documentation

## 2022-06-21 DIAGNOSIS — E7849 Other hyperlipidemia: Secondary | ICD-10-CM

## 2022-06-21 MED ORDER — METFORMIN HCL 500 MG PO TABS: 500.0000 mg | ORAL_TABLET | Freq: Two times a day (BID) | ORAL | 0 refills | Status: DC

## 2022-06-26 ENCOUNTER — Encounter: Payer: Medicare Other | Admitting: Rehabilitative and Restorative Service Providers"

## 2022-07-02 NOTE — Therapy (Signed)
OUTPATIENT OCCUPATIONAL THERAPY TREATMENT NOTE  Patient Name: Jillian [name] MRN: [id number] DOB:[birth year], 66 y.o., female Today's Date: 07/03/2022  PCP: Alma Friendly, NP REFERRING PROVIDER: Meredith Pel, MD   OT End of Session - 07/03/22 1101     Visit Number 3    Number of Visits 8    Date for OT Re-Evaluation 08/10/22    Authorization Type Medicare A & B    Progress Note Due on Visit 10    OT Start Time 1105    OT Stop Time 1139    OT Time Calculation (min) 34 min    Equipment Utilized During Treatment --    Activity Tolerance Patient tolerated treatment well;No increased pain;Patient limited by fatigue    Behavior During Therapy [facility] for tasks assessed/performed              Past Medical History:  Diagnosis Date   Allergic rhinitis    Allergy    Arthritis    Arthritis of low back    Back pain    Cataract    Cataracts, bilateral    Constipation    Depression    Dry skin    on hands   Fibroid uterus    Hyperlipidemia    Joint pain    Osteoarthritis of right shoulder    Pre-diabetes    Ringing in ears    Sciatica    sciatica   Vitamin D deficiency    Past Surgical History:  Procedure Laterality Date   ABDOMINAL HYSTERECTOMY     broken ankle     CARPAL TUNNEL RELEASE Bilateral    CATARACT EXTRACTION W/ INTRAOCULAR LENS IMPLANT Bilateral 05/22/2021   2nd eye done 07/03/21   SHOULDER SURGERY Right    Patient Active Problem List   Diagnosis Date Noted   Essential hypertension 06/21/2022   Other hyperlipidemia 04/02/2022   Sciatica 03/19/2021   Age-related nuclear cataract of both eyes 11/08/2020   Elevated blood pressure reading in office without diagnosis of hypertension 07/20/2020   Prediabetes 02/05/2019   Depression 02/05/2019   Dry skin 06/02/2018   Pelvic pain 02/19/2018   Chronic back pain 08/09/2017   Primary osteoarthritis of right shoulder 09/20/2016   Alopecia 01/10/2016   Class 1 obesity with serious comorbidity  and body mass index (BMI) of 34.0 to 34.9 in adult 09/20/2015   Preventative health care 09/08/2015   Former smoker 09/08/2014   HLD (hyperlipidemia) 09/01/2013   Vitamin D deficiency 07/03/2010   Vaginal atrophy 06/30/2009   Allergic rhinitis 03/01/2009    ONSET DATE: Onset 1.5 - 2 months ago   REFERRING DIAG: M79.645 (ICD-10-CM) - Pain in left finger(s)  THERAPY DIAG:  Pain in joint of left hand  Muscle weakness (generalized)  Other lack of coordination  Stiffness of left hand, not elsewhere classified  Rationale for Evaluation and Treatment Rehabilitation  PERTINENT HISTORY: Per MD note: "PIP extensor splint to be worn at night"  PRECAUTIONS: None  WEIGHT BEARING RESTRICTIONS No   SUBJECTIVE:   SUBJECTIVE STATEMENT: She states night orthotic makes her finger straighter in the mornings, other orthotic prevents trigger 95%, and no pain today. "It's not that throbbing pain all day long that it has been."   PAIN:  Are you having pain? No Rating: 0/10 at rest now, up to 4-5/10 in mornings    PATIENT GOALS Get full use of Lt hand without pain   OBJECTIVE: (All objective assessments below are from initial evaluation on: 06/12/22 unless otherwise specified.)  HAND DOMINANCE: Right   ADLs: Overall ADLs: States decreased ability to grab, hold household objects, pain and inability to open containers   FUNCTIONAL OUTCOME MEASURES: Eval: Patient Specific Functional Scale: 5 (wringing out cloth, wash dishes, raking)  (Higher Score  =  Better Ability for the Selected Tasks)      UPPER EXTREMITY ROM     Hand AROM Left eval  Full Fist Ability (or Gap to Distal Palmar Crease) Full fist, but a bit tight and painful in MF   Long Finger MCP (0-90)  Approx 0-80  Long PIP J (0-100) Approx (-20) - 75   Long DIP (0-70) Approx 0-30   (Blank rows = not tested)   HAND FUNCTION: Eval: Observed weakness and pain in affected LT hand.  TBD grip strength when better tolerated   Grip strength Right: TBD lbs, Left: TBD lbs   COORDINATION: Eval: Observed coordination impairments with affected Lt hand. Details TBD 9 Hole Peg Test Left: TBD sec   SENSATION: Eval:  Light touch intact today  OBSERVATIONS:   06/19/22: at this point in the day, she is triggering and shows me that.   Eval: No visible Lt MF swelling today, but TTP to P2 and PIP J. Likely triggering at A2 but no triggering at eval   TODAY'S TREATMENT:  07/03/22: Her orthotic is still working well.  OT does apply new elastic strapping.  OT also educated on how to use a Band-Aid to now prevent triggering as she has less tenderness less swelling slightly better motion now without a trigger.  She tolerates this well but is told to use the rigid orthotic if she begins to trigger or feel worse.  OT also teaches her 2 new stretches as bolded below.  This can be done passively for a nice stretch without feeling any triggering symptoms.  OT was unable to do passive PIP flexion stretches today however due to concern for trigger.  OT then does manual therapy IASTM around left middle finger feeling tendon creek she feels pressure but no pain and states feeling looser at the end.  OT also educated on ice massage and also manually her stating feeling less swollen and no pain at the end.   Exercises - Wrist Flexion Stretch  - 2-4 x daily - 3 reps - 15 sec hold - Wrist Prayer Stretch  - 3-4 x daily - 3 reps - 15 sec hold - PUSH KNUCKLES DOWN  - 2- 4 x daily - 3 reps - 15 seconds hold - BACK KNUCKLE STRETCHES   - 4 x daily - 3 reps - 15 sec hold - TIP STRETCH  - 2-3 x daily - 3 reps - 15-20 hold   06/19/22: OT clarifies she is NOT to trigger at all, if possible. She is to wear day orthotic as much as possible, and remove only for the following new exercises that were shown today (as below), to help manage stiffness.  OT also fabricates hand-based night orthotic that provides full finger extension stretch. This fits well,  feels stretch, no pain. She was edu to take this off if it is hurting her or causing problems and bring in for adjustment. She states understanding and demo's stretches well. No added pain or triggering in session (after she demo'd at start of session).   Exercises - Wrist Flexion Stretch  - 2-4 x daily - 3 reps - 15 sec hold - Wrist Prayer Stretch  - 3-4 x daily - 3 reps - 15  sec hold - PUSH KNUCKLES DOWN  - 2- 4 x daily - 3 reps - 15 seconds hold  PATIENT EDUCATION: Education details: See tx section above for details  Person educated: Patient Education method: Verbal Instruction, Teach back, Handouts  Education comprehension: States and demonstrates understanding, Additional Education required    HOME EXERCISE PROGRAM: Access Code: [email or web address] URL: [email or web address] Date: 06/19/2022 Prepared by: Benito Mccreedy  GOALS: Goals reviewed with patient? Yes   SHORT TERM GOALS: (STG required if POC>30 days) Target Date: 06/29/22  Pt will obtain protective, custom orthotic. Goal status: MET   2.  Pt will demo/state understanding of initial HEP to improve pain levels and prerequisite motion. Goal status: Progressing    LONG TERM GOALS: Target Date: 08/10/22  Pt will improve functional ability by decreased impairment per Quick PSFS assessment from 5 to 8 or better, for better quality of life. Goal status: INITIAL  2.  Pt will improve grip strength in Lt hand to at least 30lbs no pain, for functional use at home and in IADLs. Goal status: INITIAL  3.  Pt will improve A/ROM in Lt MF TAM from 165* to at least 200*, to have functional motion for tasks like reach and grasp.  Goal status: INITIAL  4.  Pt will improve coordination skills in Lt hand, as seen by better score on 9HPT testing to at least [facility], to have increased functional ability to carry out fine motor tasks (fasteners, etc.) and more complex, coordinated IADLs (meal prep, sports, etc.).  Goal status:  INITIAL  6.  Pt will decrease pain at worst (night/morning) from 8/10 to 2/10 or better to have better sleep and occupational participation in daily roles. Goal status: INITIAL    ASSESSMENT:  CLINICAL IMPRESSION: 07/03/22: We have decided to skip a week as she is managing well and she will return in 2 weeks hopefully no pain no trigger and then able to passively stretch at the PIP joint.  06/19/22: She now has night ext/stretch orthotic as well as low-profile, finger based day time orthotic to prevent trigger. We need to prevent trigger for possibly 3-6 weeks, to allow rest, then work on ROM and hand strength gently.   Eval: Patient is a 66 y.o. female who was seen today for occupational therapy evaluation for Lt MF pain, triggering and decreased ability. She will benefit from OP OT to improve fnl status.    PLAN: OT FREQUENCY: 1-2x/week  OT DURATION: 8 weeks (through 08/10/22 as medically needed)  PLANNED INTERVENTIONS: self care/ADL training, therapeutic exercise, therapeutic activity, manual therapy, passive range of motion, splinting, ultrasound, moist heat, cryotherapy, contrast bath, patient/family education, and coping strategies training  RECOMMENDED OTHER SERVICES: none now   CONSULTED AND AGREED WITH PLAN OF CARE: Patient  PLAN FOR NEXT SESSION:  Follow-up in 2 weeks check tenderness trigger and tolerance to passive stretch at PIP joint Do massages, ice, [place] treatment possibly all as needed  Benito Mccreedy, OTR/L, CHT 07/03/2022, 12:41 PM

## 2022-07-02 NOTE — Progress Notes (Signed)
Chief Complaint:   OBESITY Jillian [name] is here to discuss her progress with her obesity treatment plan along with follow-up of her obesity related diagnoses. Jillian [name] is on keeping a food journal and adhering to recommended goals of 1300-1500 calories and 85+ grams of protein daily and states she is following her eating plan approximately 70% of the time. Jillian [name] states she is doing 0 minutes 0 times per week.  Today's visit was #: 22 Starting weight: 223 lbs Starting date: 12/29/2019 Today's weight: 204 lbs Today's date: 06/21/2022 Total lbs lost to date: 19 Total lbs lost since last in-office visit: 4  Interim History: Jillian [name] has done well with her weight loss.  Her activities are limited by metatarsalgia and a trigger finger in a splint for 6 weeks.  She is focusing on decreasing snacking and decreasing simple carbohydrates.  We discussed trying to meet protein needs.  Subjective:   1. Prediabetes Jillian [name] is taking metformin twice daily with no side effects.  We plan to repeat labs before the end of the year.  2. Essential hypertension Jillian [name] has whitecoat hypertension, but her blood pressure is within normal range at home.  3. Vitamin D deficiency Jillian [name] stopped the prescription vitamin D and she is not taking OTC vitamin D as she was supratherapeutic in the past.  Assessment/Plan:   1. Prediabetes Jillian [name] will continue her eating plan, exercise, and metformin; and we will refill metformin for 90 days.  - metFORMIN (GLUCOPHAGE) 500 MG tablet; Take 1 tablet (500 mg total) by mouth 2 (two) times daily with a meal. For blood sugar.  Dispense: 180 tablet; Refill: 0  2. Essential hypertension Jillian [name] will continue with her eating plan and exercise as she is able.  3. Vitamin D deficiency We will recheck Nakia's vitamin D level before the end of the year.  4. Obesity, Current BMI 33.0 Jillian [name] is currently in the action stage of change. As such, her goal is to continue with  weight loss efforts. She has agreed to the Category 2 Plan or keeping a food journal and adhering to recommended goals of 1300-1500 calories and 85+ grams of protein daily.   Exercise goals: All adults should avoid inactivity. Some physical activity is better than none, and adults who participate in any amount of physical activity gain some health benefits.  Behavioral modification strategies: increasing lean protein intake, decreasing simple carbohydrates, and holiday eating strategies .  Jillian [name] has agreed to follow-up with our clinic in 3 to 4 weeks. She was informed of the importance of frequent follow-up visits to maximize her success with intensive lifestyle modifications for her multiple health conditions.   Objective:   Blood pressure (!) 145/80, pulse 73, temperature 98 F (36.7 C), height 5\' 6"  (1.676 m), weight 204 lb (92.5 kg), SpO2 91 %. Body mass index is 32.93 kg/m.  General: Cooperative, alert, well developed, in no acute distress. HEENT: Conjunctivae and lids unremarkable. Cardiovascular: Regular rhythm.  Lungs: Normal work of breathing. Neurologic: No focal deficits.   Lab Results  Component Value Date   CREATININE 0.98 03/05/2022   BUN 11 03/05/2022   NA 142 03/05/2022   K 4.6 03/05/2022   CL 106 03/05/2022   CO2 25 03/05/2022   Lab Results  Component Value Date   ALT 21 03/05/2022   AST 24 03/05/2022   ALKPHOS 77 03/05/2022   BILITOT 0.4 03/05/2022   Lab Results  Component Value Date   HGBA1C 5.6 03/05/2022   HGBA1C 5.7 (H) 11/06/2021  HGBA1C 5.6 07/18/2021   HGBA1C 5.7 (H) 04/03/2021   HGBA1C 5.9 (H) 12/28/2020   Lab Results  Component Value Date   INSULIN 7.8 03/05/2022   INSULIN 11.6 11/06/2021   INSULIN 10.9 07/18/2021   INSULIN 12.6 04/03/2021   INSULIN 11.3 12/28/2020   Lab Results  Component Value Date   TSH 2.670 07/18/2021   Lab Results  Component Value Date   CHOL 185 03/05/2022   HDL 95 03/05/2022   LDLCALC 82 03/05/2022    LDLDIRECT 158.6 08/18/2013   TRIG 40 03/05/2022   CHOLHDL 2 07/11/2020   Lab Results  Component Value Date   VD25OH 63.1 03/05/2022   VD25OH 83.1 11/06/2021   VD25OH 49.9 07/18/2021   Lab Results  Component Value Date   WBC 4.3 03/05/2022   HGB 13.5 03/05/2022   HCT 41.9 03/05/2022   MCV 88 03/05/2022   PLT 236 03/05/2022   No results found for: "IRON", "TIBC", "FERRITIN"  Attestation Statements:   Reviewed by clinician on day of visit: allergies, medications, problem list, medical history, surgical history, family history, social history, and previous encounter notes.   I, Burt Knack, am acting as transcriptionist for Quillian Quince, MD.  I have reviewed the above documentation for accuracy and completeness, and I agree with the above. -  Quillian Quince, MD

## 2022-07-03 ENCOUNTER — Encounter: Payer: Self-pay | Admitting: Rehabilitative and Restorative Service Providers"

## 2022-07-03 ENCOUNTER — Ambulatory Visit (INDEPENDENT_AMBULATORY_CARE_PROVIDER_SITE_OTHER): Payer: Medicare Other | Admitting: Rehabilitative and Restorative Service Providers"

## 2022-07-03 DIAGNOSIS — M25642 Stiffness of left hand, not elsewhere classified: Secondary | ICD-10-CM

## 2022-07-03 DIAGNOSIS — R278 Other lack of coordination: Secondary | ICD-10-CM

## 2022-07-03 DIAGNOSIS — M6281 Muscle weakness (generalized): Secondary | ICD-10-CM | POA: Diagnosis not present

## 2022-07-03 DIAGNOSIS — M25542 Pain in joints of left hand: Secondary | ICD-10-CM

## 2022-07-04 ENCOUNTER — Encounter (INDEPENDENT_AMBULATORY_CARE_PROVIDER_SITE_OTHER): Payer: Self-pay | Admitting: Family Medicine

## 2022-07-05 NOTE — Addendum Note (Signed)
Addended by: Dennard Nip D on: 07/05/2022 10:37 AM   Modules accepted: Orders

## 2022-07-10 ENCOUNTER — Encounter: Payer: Medicare Other | Admitting: Rehabilitative and Restorative Service Providers"

## 2022-07-10 LAB — HM MAMMOGRAPHY

## 2022-07-11 ENCOUNTER — Encounter: Payer: Self-pay | Admitting: Primary Care

## 2022-07-17 NOTE — Therapy (Signed)
OUTPATIENT OCCUPATIONAL THERAPY TREATMENT NOTE  Patient Name: Jillian [name] MRN: [id number] DOB:[birth year], 66 y.o., female Today's Date: 07/19/2022  PCP: Alma Friendly, NP REFERRING PROVIDER: Meredith Pel, MD   OT End of Session - 07/19/22 1147     Visit Number 4    Number of Visits 8    Date for OT Re-Evaluation 08/10/22    Authorization Type Medicare A & B    Progress Note Due on Visit 10    OT Start Time 1148    OT Stop Time 1223    OT Time Calculation (min) 35 min    Activity Tolerance Patient tolerated treatment well;No increased pain;Patient limited by fatigue    Behavior During Therapy [facility] for tasks assessed/performed              Past Medical History:  Diagnosis Date   Allergic rhinitis    Allergy    Arthritis    Arthritis of low back    Back pain    Cataract    Cataracts, bilateral    Constipation    Depression    Dry skin    on hands   Fibroid uterus    Hyperlipidemia    Joint pain    Osteoarthritis of right shoulder    Pre-diabetes    Ringing in ears    Sciatica    sciatica   Vitamin D deficiency    Past Surgical History:  Procedure Laterality Date   ABDOMINAL HYSTERECTOMY     broken ankle     CARPAL TUNNEL RELEASE Bilateral    CATARACT EXTRACTION W/ INTRAOCULAR LENS IMPLANT Bilateral 05/22/2021   2nd eye done 07/03/21   SHOULDER SURGERY Right    Patient Active Problem List   Diagnosis Date Noted   Essential hypertension 06/21/2022   Other hyperlipidemia 04/02/2022   Sciatica 03/19/2021   Age-related nuclear cataract of both eyes 11/08/2020   Elevated blood pressure reading in office without diagnosis of hypertension 07/20/2020   Prediabetes 02/05/2019   Depression 02/05/2019   Dry skin 06/02/2018   Pelvic pain 02/19/2018   Chronic back pain 08/09/2017   Primary osteoarthritis of right shoulder 09/20/2016   Alopecia 01/10/2016   Class 1 obesity with serious comorbidity and body mass index (BMI) of 34.0 to 34.9  in adult 09/20/2015   Preventative health care 09/08/2015   Former smoker 09/08/2014   HLD (hyperlipidemia) 09/01/2013   Vitamin D deficiency 07/03/2010   Vaginal atrophy 06/30/2009   Allergic rhinitis 03/01/2009    ONSET DATE: Onset 1.5 - 2 months ago   REFERRING DIAG: M79.645 (ICD-10-CM) - Pain in left finger(s)  THERAPY DIAG:  Pain in joint of left hand  Muscle weakness (generalized)  Other lack of coordination  Stiffness of left hand, not elsewhere classified  Rationale for Evaluation and Treatment Rehabilitation  PERTINENT HISTORY: Per MD note: "PIP extensor splint to be worn at night"  PRECAUTIONS: None  WEIGHT BEARING RESTRICTIONS No   SUBJECTIVE:   SUBJECTIVE STATEMENT: She states maybe only 1-2 trigger events in past week, no significant pains now or in past week, and she states feeling slightly more flexible now.   PAIN:  Are you having pain? No  Rating: 0/10 at rest now, up to 1-2/10 in mornings    PATIENT GOALS Get full use of Lt hand without pain   OBJECTIVE: (All objective assessments below are from initial evaluation on: 06/12/22 unless otherwise specified.)    HAND DOMINANCE: Right   ADLs: Overall ADLs: States decreased  ability to grab, hold household objects, pain and inability to open containers   FUNCTIONAL OUTCOME MEASURES: 07/19/22: PSFS 7.3 today, much better  Eval: Patient Specific Functional Scale: 5 (wringing out cloth, wash dishes, raking)  (Higher Score  =  Better Ability for the Selected Tasks)      UPPER EXTREMITY ROM     Hand AROM Left eval Lt  07/19/22  Full Fist Ability (or Gap to Distal Palmar Crease) Full fist, but a bit tight and painful in MF    Long Finger MCP (0-90)  Approx 0-80 0-72*  Long PIP J (0-100) Approx (-20) - 75  (-22) -54  Long DIP (0-70) Approx 0-30  0-18  (Blank rows = not tested)   HAND FUNCTION: Eval: Observed weakness and pain in affected LT hand.  TBD grip strength when better tolerated   Grip strength Right: TBD lbs, Left: TBD lbs   COORDINATION: 07/19/22: 9 Hole Peg Test Left: 50 sec   Eval: Observed coordination impairments with affected Lt hand. Details TBD   SENSATION: Eval:  Light touch intact today  OBSERVATIONS:   06/19/22: at this point in the day, she is triggering and shows me that.   Eval: No visible Lt MF swelling today, but TTP to P2 and PIP J. Likely triggering at A2 but no triggering at eval   TODAY'S TREATMENT:  07/19/22:  Now tolerating band-aide well with no trigger, but AROM measured tighter today. During FMS tasks, she was overtly avoiding use of digits 3-5 in Lt hand, which OT strongly suggests again, and has her perform the following activities/exercises with PIP J band-aide, and she does well no pain or locking. HEP was also quickly reviewed, and she's doing well with that. She needs to be including her whole hand in motion and activities.  Additionally OT has her perform the following exercises and activities.  She tolerates well with no locking or pain.  Ring out cloth (yellow flex bar) x 10 Supination yellow flex bar x 10  9HPT FMS tasks x 5 mins     07/03/22: Her orthotic is still working well.  OT does apply new elastic strapping.  OT also educated on how to use a Band-Aid to now prevent triggering as she has less tenderness less swelling slightly better motion now without a trigger.  She tolerates this well but is told to use the rigid orthotic if she begins to trigger or feel worse.  OT also teaches her 2 new stretches as bolded below.  This can be done passively for a nice stretch without feeling any triggering symptoms.  OT was unable to do passive PIP flexion stretches today however due to concern for trigger.  OT then does manual therapy IASTM around left middle finger feeling tendon creek she feels pressure but no pain and states feeling looser at the end.  OT also educated on ice massage and also manually her stating feeling less  swollen and no pain at the end.   Exercises - Wrist Flexion Stretch  - 2-4 x daily - 3 reps - 15 sec hold - Wrist Prayer Stretch  - 3-4 x daily - 3 reps - 15 sec hold - PUSH KNUCKLES DOWN  - 2- 4 x daily - 3 reps - 15 seconds hold - BACK KNUCKLE STRETCHES   - 4 x daily - 3 reps - 15 sec hold - TIP STRETCH  - 2-3 x daily - 3 reps - 15-20 hold  PATIENT EDUCATION: Education details: See tx  section above for details  Person educated: Patient Education method: Verbal Instruction, Teach back, Handouts  Education comprehension: States and demonstrates understanding, Additional Education required    HOME EXERCISE PROGRAM: Access Code: [email or web address] URL: [email or web address] Date: 06/19/2022 Prepared by: Benito Mccreedy  GOALS: Goals reviewed with patient? Yes   SHORT TERM GOALS: (STG required if POC>30 days) Target Date: 06/29/22  Pt will obtain protective, custom orthotic. Goal status: MET   2.  Pt will demo/state understanding of initial HEP to improve pain levels and prerequisite motion. Goal status: Progressing    LONG TERM GOALS: Target Date: 08/10/22  Pt will improve functional ability by decreased impairment per Quick PSFS assessment from 5 to 8 or better, for better quality of life. Goal status: INITIAL  2.  Pt will improve grip strength in Lt hand to at least 30lbs no pain, for functional use at home and in IADLs. Goal status: INITIAL  3.  Pt will improve A/ROM in Lt MF TAM from 165* to at least 200*, to have functional motion for tasks like reach and grasp.  Goal status: INITIAL  4.  Pt will improve coordination skills in Lt hand, as seen by better score on 9HPT testing to at least [facility], to have increased functional ability to carry out fine motor tasks (fasteners, etc.) and more complex, coordinated IADLs (meal prep, sports, etc.).  Goal status: INITIAL  6.  Pt will decrease pain at worst (night/morning) from 8/10 to 2/10 or better to have better sleep  and occupational participation in daily roles. Goal status: INITIAL    ASSESSMENT:  CLINICAL IMPRESSION: 07/19/22: She has made progress, but continues to be very guarded towards triggering which is making her hand somewhat stiffer now.  She is unable for the most part to trigger while wearing braces or Band-Aids now, so OT urged her to use her hands somewhat normally and less painful.  Continue plan of care.  OT does tell her that there will not be much new things to share with her in therapy unless she progresses at this point.  She needs to be consistent, so we will put off therapy for 2 weeks to see if she is less tender and has less triggering by that time.   PLAN: OT FREQUENCY: 1-2x/week  OT DURATION: 8 weeks (through 08/10/22 as medically needed)  PLANNED INTERVENTIONS: self care/ADL training, therapeutic exercise, therapeutic activity, manual therapy, passive range of motion, splinting, ultrasound, moist heat, cryotherapy, contrast bath, patient/family education, and coping strategies training  RECOMMENDED OTHER SERVICES: none now   CONSULTED AND AGREED WITH PLAN OF CARE: Patient  PLAN FOR NEXT SESSION:  Check triggering symptoms check pain check functional ability coordination etc.  Reassessment will be due consider need for additional therapy.   Benito Mccreedy, OTR/L, CHT 07/19/2022, 6:20 PM

## 2022-07-18 LAB — LIPID PANEL WITH LDL/HDL RATIO
Cholesterol, Total: 203 mg/dL — ABNORMAL HIGH (ref 100–199)
HDL: 92 mg/dL (ref >39)
LDL Chol Calc (NIH): 103 mg/dL — ABNORMAL HIGH (ref 0–99)
LDL/HDL Ratio: 1.1 ratio (ref 0.0–3.2)
Triglycerides: 39 mg/dL (ref 0–149)
VLDL Cholesterol Cal: 8 mg/dL (ref 5–40)

## 2022-07-18 LAB — CMP14+EGFR
ALT: 19 IU/L (ref 0–32)
AST: 22 IU/L (ref 0–40)
Albumin/Globulin Ratio: 1.7 (ref 1.2–2.2)
Albumin: 4.6 g/dL (ref 3.9–4.9)
Alkaline Phosphatase: 78 IU/L (ref 44–121)
BUN/Creatinine Ratio: 20 (ref 12–28)
BUN: 19 mg/dL (ref 8–27)
Bilirubin Total: 0.3 mg/dL (ref 0.0–1.2)
CO2: 23 mmol/L (ref 20–29)
Calcium: 9.4 mg/dL (ref 8.7–10.3)
Chloride: 105 mmol/L (ref 96–106)
Creatinine, Ser: 0.93 mg/dL (ref 0.57–1.00)
Globulin, Total: 2.7 g/dL (ref 1.5–4.5)
Glucose: 87 mg/dL (ref 70–99)
Potassium: 4.7 mmol/L (ref 3.5–5.2)
Sodium: 142 mmol/L (ref 134–144)
Total Protein: 7.3 g/dL (ref 6.0–8.5)
eGFR: 68 mL/min/{1.73_m2} (ref >59)

## 2022-07-18 LAB — HEMOGLOBIN A1C
Est. average glucose Bld gHb Est-mCnc: 117 mg/dL
Hgb A1c MFr Bld: 5.7 % — ABNORMAL HIGH (ref 4.8–5.6)

## 2022-07-18 LAB — VITAMIN D 25 HYDROXY (VIT D DEFICIENCY, FRACTURES): Vit D, 25-Hydroxy: 56.2 ng/mL (ref 30.0–100.0)

## 2022-07-18 LAB — VITAMIN B12: Vitamin B-12: 1682 pg/mL — ABNORMAL HIGH (ref 232–1245)

## 2022-07-18 LAB — INSULIN, RANDOM: INSULIN: 13 u[IU]/mL (ref 2.6–24.9)

## 2022-07-19 ENCOUNTER — Encounter (INDEPENDENT_AMBULATORY_CARE_PROVIDER_SITE_OTHER): Payer: Self-pay | Admitting: Family Medicine

## 2022-07-19 ENCOUNTER — Ambulatory Visit (INDEPENDENT_AMBULATORY_CARE_PROVIDER_SITE_OTHER): Payer: Medicare Other | Admitting: Rehabilitative and Restorative Service Providers"

## 2022-07-19 ENCOUNTER — Encounter: Payer: Self-pay | Admitting: Rehabilitative and Restorative Service Providers"

## 2022-07-19 ENCOUNTER — Ambulatory Visit (INDEPENDENT_AMBULATORY_CARE_PROVIDER_SITE_OTHER): Payer: Medicare Other | Admitting: Family Medicine

## 2022-07-19 VITALS — BP 142/80 | HR 91 | Temp 98.2°F | Ht 66.0 in | Wt 207.0 lb

## 2022-07-19 DIAGNOSIS — M25642 Stiffness of left hand, not elsewhere classified: Secondary | ICD-10-CM | POA: Diagnosis not present

## 2022-07-19 DIAGNOSIS — R7303 Prediabetes: Secondary | ICD-10-CM | POA: Diagnosis not present

## 2022-07-19 DIAGNOSIS — E669 Obesity, unspecified: Secondary | ICD-10-CM

## 2022-07-19 DIAGNOSIS — M25542 Pain in joints of left hand: Secondary | ICD-10-CM | POA: Diagnosis not present

## 2022-07-19 DIAGNOSIS — R278 Other lack of coordination: Secondary | ICD-10-CM | POA: Diagnosis not present

## 2022-07-19 DIAGNOSIS — R030 Elevated blood-pressure reading, without diagnosis of hypertension: Secondary | ICD-10-CM | POA: Diagnosis not present

## 2022-07-19 DIAGNOSIS — M6281 Muscle weakness (generalized): Secondary | ICD-10-CM

## 2022-07-19 DIAGNOSIS — Z6833 Body mass index (BMI) 33.0-33.9, adult: Secondary | ICD-10-CM

## 2022-07-19 DIAGNOSIS — E7849 Other hyperlipidemia: Secondary | ICD-10-CM | POA: Diagnosis not present

## 2022-07-19 MED ORDER — METFORMIN HCL 500 MG PO TABS: 500.0000 mg | ORAL_TABLET | Freq: Two times a day (BID) | ORAL | 0 refills | Status: DC

## 2022-07-20 ENCOUNTER — Encounter (INDEPENDENT_AMBULATORY_CARE_PROVIDER_SITE_OTHER): Payer: Self-pay | Admitting: Family Medicine

## 2022-07-31 NOTE — Progress Notes (Signed)
Chief Complaint:   OBESITY Jillian [name] is here to discuss her progress with her obesity treatment plan along with follow-up of her obesity related diagnoses. [name] is on the Category 2 Plan or keeping a food journal and adhering to recommended goals of 1300-1500 calories and 85+ grams of protein and states she is following her eating plan approximately 75% of the time. [name] states she is doing 0 minutes 0 times per week.  Today's visit was #: 23 Starting weight: 223 lbs Starting date: 12/29/2019 Today's weight: 207 lbs Today's date: 07/19/2022 Total lbs lost to date: 16 Total lbs lost since last in-office visit: 0  Interim History: [name] continues to work on her diet and weight loss.  She is retaining some water weight today, and this is likely due to the holidays and it being after lunch.  Subjective:   1. Other hyperlipidemia [name]'s last HDL and triglycerides are very well-controlled.  Her LDL crept up despite eating better.  2. Prediabetes [name]'s last A1c was slightly elevated right after Thanksgiving.  She denies nausea or vomiting, and she notes decreased polyphagia.  3. Elevated blood pressure reading in office without diagnosis of hypertension [name]'s blood pressure is normally better controlled, but she was rushing a bit today.  Assessment/Plan:   1. Other hyperlipidemia [name] will continue with her diet and weight loss efforts, and we will recheck labs in 3 months.  2. Prediabetes We will refill metformin for 90 days. [name] will continue to work on weight loss, exercise, and decreasing simple carbohydrates to help decrease the risk of diabetes.   - metFORMIN (GLUCOPHAGE) 500 MG tablet; Take 1 tablet (500 mg total) by mouth 2 (two) times daily with a meal. For blood sugar.  Dispense: 180 tablet; Refill: 0  3. Elevated blood pressure reading in office without diagnosis of hypertension [name] will continue her diet and decreasing sodium.  Her blood  pressure will be rechecked with her PCP at her upcoming visit.  4. Obesity, Current BMI 33.4 [name] is currently in the action stage of change. As such, her goal is to continue with weight loss efforts. She has agreed to the Category 2 Plan.   Behavioral modification strategies: increasing lean protein intake and holiday eating strategies .  [name] has agreed to follow-up with our clinic in 4 weeks. She was informed of the importance of frequent follow-up visits to maximize her success with intensive lifestyle modifications for her multiple health conditions.   Objective:   Blood pressure (!) 142/80, pulse 91, temperature 98.2 F (36.8 C), height '5\' 6"'$  (1.676 m), weight 207 lb (93.9 kg), SpO2 99 %. Body mass index is 33.41 kg/m.  General: Cooperative, alert, well developed, in no acute distress. HEENT: Conjunctivae and lids unremarkable. Cardiovascular: Regular rhythm.  Lungs: Normal work of breathing. Neurologic: No focal deficits.   Lab Results  Component Value Date   CREATININE 0.93 07/17/2022   BUN 19 07/17/2022   NA 142 07/17/2022   K 4.7 07/17/2022   CL 105 07/17/2022   CO2 23 07/17/2022   Lab Results  Component Value Date   ALT 19 07/17/2022   AST 22 07/17/2022   ALKPHOS 78 07/17/2022   BILITOT 0.3 07/17/2022   Lab Results  Component Value Date   HGBA1C 5.7 (H) 07/17/2022   HGBA1C 5.6 03/05/2022   HGBA1C 5.7 (H) 11/06/2021   HGBA1C 5.6 07/18/2021   HGBA1C 5.7 (H) 04/03/2021   Lab Results  Component Value Date   INSULIN 13.0 07/17/2022  INSULIN 7.8 03/05/2022   INSULIN 11.6 11/06/2021   INSULIN 10.9 07/18/2021   INSULIN 12.6 04/03/2021   Lab Results  Component Value Date   TSH 2.670 07/18/2021   Lab Results  Component Value Date   CHOL 203 (H) 07/17/2022   HDL 92 07/17/2022   LDLCALC 103 (H) 07/17/2022   LDLDIRECT 158.6 08/18/2013   TRIG 39 07/17/2022   CHOLHDL 2 07/11/2020   Lab Results  Component Value Date   VD25OH 56.2 07/17/2022    VD25OH 63.1 03/05/2022   VD25OH 83.1 11/06/2021   Lab Results  Component Value Date   WBC 4.3 03/05/2022   HGB 13.5 03/05/2022   HCT 41.9 03/05/2022   MCV 88 03/05/2022   PLT 236 03/05/2022   No results found for: "IRON", "TIBC", "FERRITIN"  Attestation Statements:   Reviewed by clinician on day of visit: allergies, medications, problem list, medical history, surgical history, family history, social history, and previous encounter notes.  I have personally spent 44 minutes total time today in preparation, patient care, and documentation for this visit, including the following: review of clinical lab tests; review of medical tests/procedures/services.   I, Trixie Dredge, am acting as transcriptionist for Dennard Nip, MD.  I have reviewed the above documentation for accuracy and completeness, and I agree with the above. -  Dennard Nip, MD

## 2022-08-03 ENCOUNTER — Encounter: Payer: Medicare Other | Admitting: Primary Care

## 2022-08-07 ENCOUNTER — Ambulatory Visit (INDEPENDENT_AMBULATORY_CARE_PROVIDER_SITE_OTHER): Payer: Medicare Other | Admitting: Primary Care

## 2022-08-07 ENCOUNTER — Encounter: Payer: Self-pay | Admitting: Primary Care

## 2022-08-07 VITALS — BP 142/80 | HR 95 | Temp 98.1°F | Ht 66.0 in | Wt 212.0 lb

## 2022-08-07 DIAGNOSIS — F331 Major depressive disorder, recurrent, moderate: Secondary | ICD-10-CM | POA: Diagnosis not present

## 2022-08-07 DIAGNOSIS — E785 Hyperlipidemia, unspecified: Secondary | ICD-10-CM | POA: Diagnosis not present

## 2022-08-07 DIAGNOSIS — E6609 Other obesity due to excess calories: Secondary | ICD-10-CM | POA: Diagnosis not present

## 2022-08-07 DIAGNOSIS — G8929 Other chronic pain: Secondary | ICD-10-CM

## 2022-08-07 DIAGNOSIS — M79643 Pain in unspecified hand: Secondary | ICD-10-CM

## 2022-08-07 DIAGNOSIS — R7303 Prediabetes: Secondary | ICD-10-CM

## 2022-08-07 DIAGNOSIS — Z6834 Body mass index (BMI) 34.0-34.9, adult: Secondary | ICD-10-CM

## 2022-08-07 DIAGNOSIS — I10 Essential (primary) hypertension: Secondary | ICD-10-CM

## 2022-08-07 MED ORDER — ATORVASTATIN CALCIUM 20 MG PO TABS: 20.0000 mg | ORAL_TABLET | Freq: Every evening | ORAL | 3 refills | Status: DC

## 2022-08-07 MED ORDER — BUPROPION HCL ER (SR) 200 MG PO TB12: ORAL_TABLET | ORAL | 3 refills | Status: DC

## 2022-08-07 NOTE — Assessment & Plan Note (Addendum)
Following with orthopedics.  Continue occupational therapy. Reviewed MRI from September 2023.

## 2022-08-07 NOTE — Patient Instructions (Signed)
It was a pleasure to see you today!   

## 2022-08-07 NOTE — Assessment & Plan Note (Signed)
Controlled.  Continue bupropion SR 200 mg daily.

## 2022-08-07 NOTE — Assessment & Plan Note (Signed)
Overall controlled. Reviewed lipid panel from November 2023 per [facility].  Continue atorvastatin 20 mg daily.

## 2022-08-07 NOTE — Progress Notes (Signed)
Subjective:    Patient ID: Jillian [name], female    DOB: May 03, 1956, 66 y.o.   MRN: [id number]  HPI  Jillian [name] is a very pleasant 66 y.o. female with a history of hypertension and, alopecia, hyperlipidemia, chronic back pain, prediabetes, depression who presents today for follow-up of chronic conditions.   Immunizations: -Tetanus: 2020 -Influenza: Completed this season  -Shingles: Completed Zostavax and Shingrix -Pneumonia: Prevnar 20 in 2022  Mammogram: Completed in November 2023  Colonoscopy: Completed in 2019, due November 2024. Dexa: Completed in January 2023   1) Essential Hypertension: Currently not managed on treatment.  Blood pressure has been above goal during her last several [facility].  BP Readings from Last 3 Encounters:  08/07/22 (!) 142/80  07/19/22 (!) 142/80  06/21/22 (!) 145/80   She checks her BP at home and gets readings of 130's/80's. She denies chest pain, shortness of breath, headaches.    2) Depression: Currently managed on bupropion SR 200 mg daily. Overall feels well managed on this regimen and would like to continue.   3) Hyperlipidemia: Currently managed on atorvastatin 20 mg daily.  Her last lipid panel was completed in November 2023 [facility].  Recent LDL was 103, plan is to work on eating habits and recheck around late February 2024.  4) Prediabetes: Currently managed on metformin 500 mg twice daily which was [facility] [place].  Her most recent A1c was 5.7 in November 2023.  5) Chronic Hand Pain: Following with orthopedics and occupational therapy. She is managed on Ibuprofen for which she will take 400 mg twice daily once weekly.   She's undergone MRI of the fingers which revealed mild tendinosis of 3rd flexor muscle. She is sleeping with a night splint and then uses a special splint during the days.    Review of Systems  Respiratory:  Negative for shortness of breath.   Cardiovascular:  Negative for chest pain.  Musculoskeletal:  Positive for arthralgias and joint swelling.  Neurological:  Negative for dizziness and headaches.  Psychiatric/Behavioral:  The patient is not nervous/anxious.          Past Medical History:  Diagnosis Date   Allergic rhinitis    Allergy    Arthritis    Arthritis of low back    Back pain    Cataract    Cataracts, bilateral    Constipation    Depression    Dry skin    on hands   Fibroid uterus    Hyperlipidemia    Joint pain    Osteoarthritis of right shoulder    Pre-diabetes    Ringing in ears    Sciatica    sciatica   Vitamin D deficiency     Social History   Socioeconomic History   Marital status: Married    Spouse name: [name]   Number of children: 0   Years of education: Not on file   Highest education level: Not on file  Occupational History   Occupation: Retired [employer]  Tobacco Use   Smoking status: Former    Packs/day: 1.00    Years: 29.00    Total pack years: 29.00    Types: Cigarettes   Smokeless tobacco: Never   Tobacco comments:    30 years  Vaping Use   Vaping Use: Never used  Substance and Sexual Activity   Alcohol use: No  Drug use: No   Sexual activity: Not on file  Other Topics Concern   Not on file  Social History Narrative   Not on file   Social Determinants of Health   Financial Resource Strain: Low Risk  (07/26/2021)   Overall Financial Resource Strain (CARDIA)    Difficulty of Paying Living Expenses: Not hard at all  Food Insecurity: No Food Insecurity (07/26/2021)   Hunger Vital Sign    Worried About Running Out of Food in the Last Year: Never true    Ran Out of Food in the Last Year: Never true  Transportation Needs: No Transportation Needs (07/26/2021)   PRAPARE - Hydrologist (Medical): No    Lack of Transportation (Non-Medical): No   Physical Activity: Insufficiently Active (07/26/2021)   Exercise Vital Sign    Days of Exercise per Week: 3 days    Minutes of Exercise per Session: 30 min  Stress: No Stress Concern Present (07/26/2021)   [place]    Feeling of Stress : Not at all  Social Connections: [place] (07/26/2021)   Social Connection and Isolation Panel [NHANES]    Frequency of Communication with Friends and Family: More than three times a week    Frequency of Social Gatherings with Friends and Family: Once a week    Attends Religious Services: More than 4 times per year    Active Member of [employer] or Organizations: Yes    Attends Music therapist: More than 4 times per year    Marital Status: Married  Human resources officer Violence: Not At Risk (07/26/2021)   Humiliation, Afraid, Rape, and Kick questionnaire    Fear of Current or Ex-Partner: No    Emotionally Abused: No    Physically Abused: No    Sexually Abused: No    Past Surgical History:  Procedure Laterality Date   ABDOMINAL HYSTERECTOMY     broken ankle     CARPAL TUNNEL RELEASE Bilateral    CATARACT EXTRACTION W/ INTRAOCULAR LENS IMPLANT Bilateral 05/22/2021   2nd eye done 07/03/21   SHOULDER SURGERY Right    SKIN BIOPSY Right 10/2021    Family History  Problem Relation Age of Onset   Colon cancer Sister    Colon polyps Sister    Diabetes Mother        father and 9 siblings   Kidney disease Mother        renal failure   Hypertension Mother    Hyperlipidemia Mother    Stroke Mother    Depression Mother    Anxiety disorder Mother    Obesity Mother    Heart failure Mother    Heart disease Father        paternal grandmother   Obesity Father    Diabetes Father    Hypertension Father    Hyperlipidemia Father    Eating disorder Father    Esophageal cancer Neg Hx    Rectal cancer Neg Hx    Stomach cancer Neg Hx     No Known Allergies  Current  Outpatient Medications on File Prior to Visit  Medication Sig Dispense Refill   Ascorbic Acid (VITAMIN C) 1000 MG tablet Take 1,000 mg by mouth daily.     aspirin EC 81 MG tablet Take 81 mg by mouth daily.     CALCIUM-MAGNESIUM-ZINC PO Take 1 capsule by mouth daily.     cetirizine (ZYRTEC) 10 MG tablet Take 1  tablet (10 mg total) by mouth daily as needed. 90 tablet 1   Coenzyme Q10 (CO Q 10) 100 MG CAPS Take 1 capsule by mouth daily.     fluocinonide-emollient (LIDEX-E) 0.05 % cream APPLY  CREAM EXTERNALLY TWICE DAILY TO  AFFECTED  AREA 60 g 0   fluticasone (FLONASE) 50 MCG/ACT nasal spray Place 1 spray into both nostrils daily as needed. 16 g 2   GARLIC PO Take 623 mg by mouth daily.     Hydroquinone 8 % EMUL Apply topically.     meloxicam (MOBIC) 15 MG tablet Take 1 tablet (15 mg total) by mouth daily. 30 tablet 1   metFORMIN (GLUCOPHAGE) 500 MG tablet Take 1 tablet (500 mg total) by mouth 2 (two) times daily with a meal. For blood sugar. 180 tablet 0   Multiple Vitamins-Minerals (ALIVE ONCE DAILY WOMENS 50+) TABS Take 1 tablet by mouth daily.     NONFORMULARY OR COMPOUNDED ITEM Apply topically. Hydroquinone 8% compound cream sent by Dermatology to [facility]     Omega-3 1000 MG CAPS Take 1 capsule by mouth daily.     Probiotic Product (PROBIOTIC-10) CAPS Take 1 capsule by mouth daily.     Turmeric Curcumin 500 MG CAPS Take 1 capsule by mouth daily.     vitamin E 400 UNIT capsule Take 400 Units by mouth daily.     No current facility-administered medications on file prior to visit.    BP (!) 142/80   Pulse 95   Temp 98.1 F (36.7 C) (Temporal)   Ht '5\' 6"'$  (1.676 m)   Wt 212 lb (96.2 kg)   SpO2 99%   BMI 34.22 kg/m  Objective:   Physical Exam Cardiovascular:     Rate and Rhythm: Normal rate and regular rhythm.  Pulmonary:     Effort: Pulmonary effort is normal.     Breath sounds: Normal breath sounds.  Abdominal:     Palpations: Abdomen is soft.     Tenderness: There is no  abdominal tenderness.  Musculoskeletal:     Cervical back: Neck supple.  Skin:    General: Skin is warm and dry.  Neurological:     Mental Status: She is alert.  Psychiatric:        Mood and Affect: Mood normal.           Assessment & Plan:   Problem List Items Addressed This Visit       Cardiovascular and Mediastinum   Essential hypertension - Primary    Above goal today, home readings are better.  Continue to monitor BP. Consider adding amlodipine 5 mg daily if BP remains >135/90. She will notify if this is the case.       Relevant Medications   atorvastatin (LIPITOR) 20 MG tablet     Other   HLD (hyperlipidemia)    Overall controlled. Reviewed lipid panel from November 2023 per [facility].  Continue atorvastatin 20 mg daily.       Relevant Medications   atorvastatin (LIPITOR) 20 MG tablet   Class 1 obesity with serious comorbidity and body mass index (BMI) of 34.0 to 34.9 in adult    [facility] with [facility]. Reviewed labs and notes from November 2023.      Prediabetes    Continue metformin 500 mg BID. A1C reviewed from November 2023 through [facility].       Depression    Controlled.  Continue bupropion SR 200 mg  daily.       Relevant Medications   buPROPion (WELLBUTRIN SR) 200 MG 12 hr tablet   Chronic hand pain    Following with orthopedics.  Continue occupational therapy. Reviewed MRI from September 2023.      Relevant Medications   buPROPion (WELLBUTRIN SR) 200 MG 12 hr tablet       Pleas Koch, NP

## 2022-08-07 NOTE — Assessment & Plan Note (Signed)
Continue metformin 500 mg BID. A1C reviewed from November 2023 through [facility].

## 2022-08-07 NOTE — Assessment & Plan Note (Signed)
Following with [facility]. Reviewed labs and notes from November 2023.

## 2022-08-07 NOTE — Assessment & Plan Note (Signed)
Above goal today, home readings are better.  Continue to monitor BP. Consider adding amlodipine 5 mg daily if BP remains >135/90. She will notify if this is the case.

## 2022-08-07 NOTE — Therapy (Addendum)
OUTPATIENT OCCUPATIONAL THERAPY TREATMENT & DISCHARGE NOTE  Patient Name: Jillian [name] MRN: [id number] DOB:21-Dec-1955, 66 y.o., female Today's Date: 08/09/2022  PCP: Alma Friendly, NP REFERRING PROVIDER: Meredith Pel, MD  Progress Note Reporting Period 06/12/22 to 08/09/22.  See note below for Objective Data and Assessment of Progress/Goals.     OT End of Session - 08/09/22 1343     Visit Number 5    Number of Visits 8    Date for OT Re-Evaluation 08/24/22    Authorization Type Medicare A & B    Progress Note Due on Visit 10    OT Start Time 1343    OT Stop Time 1429    OT Time Calculation (min) 46 min    Activity Tolerance Patient tolerated treatment well;No increased pain;Patient limited by fatigue    Behavior During Therapy [facility] for tasks assessed/performed              Past Medical History:  Diagnosis Date   Allergic rhinitis    Allergy    Arthritis    Arthritis of low back    Back pain    Cataract    Cataracts, bilateral    Constipation    Depression    Dry skin    on hands   Fibroid uterus    Hyperlipidemia    Joint pain    Osteoarthritis of right shoulder    Pre-diabetes    Ringing in ears    Sciatica    sciatica   Vitamin D deficiency    Past Surgical History:  Procedure Laterality Date   ABDOMINAL HYSTERECTOMY     broken ankle     CARPAL TUNNEL RELEASE Bilateral    CATARACT EXTRACTION W/ INTRAOCULAR LENS IMPLANT Bilateral 05/22/2021   2nd eye done 07/03/21   SHOULDER SURGERY Right    SKIN BIOPSY Right 10/2021   Patient Active Problem List   Diagnosis Date Noted   Chronic hand pain 08/07/2022   Essential hypertension 06/21/2022   Other hyperlipidemia 04/02/2022   Sciatica 03/19/2021   Age-related nuclear cataract of both eyes 11/08/2020   Elevated blood pressure reading in office without diagnosis of hypertension 07/20/2020   Prediabetes 02/05/2019   Depression 02/05/2019   Dry skin 06/02/2018   Pelvic pain  02/19/2018   Chronic back pain 08/09/2017   Primary osteoarthritis of right shoulder 09/20/2016   Alopecia 01/10/2016   Class 1 obesity with serious comorbidity and body mass index (BMI) of 34.0 to 34.9 in adult 09/20/2015   Preventative health care 09/08/2015   Former smoker 09/08/2014   HLD (hyperlipidemia) 09/01/2013   Vitamin D deficiency 07/03/2010   Vaginal atrophy 06/30/2009   Allergic rhinitis 03/01/2009    ONSET DATE: Onset 1.5 - 2 months ago   REFERRING DIAG: M79.645 (ICD-10-CM) - Pain in left finger(s)  THERAPY DIAG:  Pain in joint of left hand  Other lack of coordination  Muscle weakness (generalized)  Stiffness of left hand, not elsewhere classified  Rationale for Evaluation and Treatment Rehabilitation  PERTINENT HISTORY: Per MD note: "PIP extensor splint to be worn at night"  PRECAUTIONS: None; WEIGHT BEARING RESTRICTIONS No   SUBJECTIVE:   SUBJECTIVE STATEMENT: She states being able to tolerate a better fist, only having 1-2 episodes of "locking" in about 2 weeks time. Still slightly worried about lack of full PIP J extension, though night orthotic is working well in the night. Band-aides and flexion blocking orthotic also working very well.    PAIN:  Are  you having pain? No  Rating: 0/10 at rest now, up to 7/10 in past week at worst (1-2 since last seen in 2 weeks)    PATIENT GOALS Get full use of Lt hand without pain   OBJECTIVE: (All objective assessments below are from initial evaluation on: 06/12/22 unless otherwise specified.)    HAND DOMINANCE: Right   ADLs: Overall ADLs: 08/09/22: much better now, but still can't wring out towel very strongly yet. Still some avoidance of use of left hand.     FUNCTIONAL OUTCOME MEASURES: 08/09/22:  PSFS 6.6 today  07/19/22: PSFS 7.3 today, much better  Eval: Patient Specific Functional Scale: 5 (wringing out cloth, wash dishes, raking)  (Higher Score  =  Better Ability for the Selected Tasks)       UPPER EXTREMITY ROM     Hand AROM Left eval Lt  07/19/22 Lt 08/09/22  Full Fist Ability (or Gap to Distal Palmar Crease) Full fist, but a bit tight and painful in MF     Long Finger MCP (0-90)  Approx 0-80 0-72* 0- 74  Long PIP J (0-100) Approx (-20) - 75  (-22) -54 (-14) - 93  Long DIP (0-70) Approx 0-30  0-18 0-61  (Blank rows = not tested)   HAND FUNCTION: 08/09/22: Rt hand grip: 46.7#, Lt grip 17# no pain/locking    COORDINATION: 08/09/22: 24sec today, much improved coordination.   07/19/22: 9 Hole Peg Test Left: 50 sec    OBSERVATIONS:   08/09/22: She is much less tender to palpation at volar surface of middle finger now.  She can make almost a complete fist with no support and no locking or pain.  She is still somewhat limited ROM and strength and continues to guard her finger, somewhat appropriately.  She can now tolerate light gripping without any significant pain.  She still is adverse to having surgery, and is improving so we will request additional therapy visit to see if grip strength & ability improves further in about 2 weeks.   TODAY'S TREATMENT:  08/09/22: Pt performs AROM, gripping, and strength with Lt hand for exercise/activities as well as new measures today. OT also discusses home and functional tasks with the pt and reviews goals.  She performs fine motor skill nine-hole peg test much better today showing improved coordination and also with sweeping activity that she does fairly well with but cannot achieve a full grip, rates 5 out of 10 performance now.  Using the complied data, OT also reviews home exercises and provides updated exercise upgrades as below. Pt states understanding and tolerates upgrades well.   -Prayer stretches -Finger ext stretches -Hook IP J stretches -MCP J flexion stretches -isometric gripping on towel 5-10 x for 10 sec as tolerated     PATIENT EDUCATION: Education details: See tx section above for details  Person educated:  Patient Education method: Veterinary surgeon, Teach back, Handouts  Education comprehension: States and demonstrates understanding, Additional Education required    HOME EXERCISE PROGRAM: Access Code: [email or web address] URL: [email or web address] Date: 06/19/2022 Prepared by: Benito Mccreedy  GOALS: Goals reviewed with patient? Yes   SHORT TERM GOALS: (STG required if POC>30 days) Target Date: 06/29/22  Pt will obtain protective, custom orthotic. Goal status: MET   2.  Pt will demo/state understanding of initial HEP to improve pain levels and prerequisite motion. Goal status: MET 08/09/22   LONG TERM GOALS: Target Date: 08/10/22  Pt will improve functional ability by decreased impairment per Quick PSFS assessment from 5 to  8 or better, for better quality of life. Goal status: 08/09/22: 6.6 today  2.  Pt will improve grip strength in Lt hand to at least 30lbs no pain, for functional use at home and in IADLs. Goal status: 08/09/22: Progressing - now 17# without locking  3.  Pt will improve A/ROM in Lt MF TAM from 165* to at least 200*, to have functional motion for tasks like reach and grasp.  Goal status: 08/09/22: MET 214* now  4.  Pt will improve coordination skills in Lt hand, as seen by better score on 9HPT testing to at least [facility], to have increased functional ability to carry out fine motor tasks (fasteners, etc.) and more complex, coordinated IADLs (meal prep, sports, etc.).  Goal status: 08/09/22: MET now 24sec  6.  Pt will decrease pain at worst (night/morning) from 8/10 to 2/10 or better to have better sleep and occupational participation in daily roles. Goal status: 08/09/22: Progressing-  most of the time she is without pain, but there have been 1-2 instances of 7/10 pain.     ASSESSMENT:  CLINICAL IMPRESSION: 08/09/22: She has only needed to come in 4 times over the past approx 1.5 months, and has been self-managing well (avoiding triggering finger).   Because she has steadily improved, but still has some weakness and tightness in her hand, we will request an additional 2 weeks timeframe for therapy to assess improvements in grip strength and functional ability.   PLAN: OT FREQUENCY:  1-2 more visits  OT DURATION: additional 2 weeks (through 08/24/22)  PLANNED INTERVENTIONS: self care/ADL training, therapeutic exercise, therapeutic activity, manual therapy, passive range of motion, splinting, ultrasound, moist heat, cryotherapy, contrast bath, patient/family education, and coping strategies training  RECOMMENDED OTHER SERVICES: none now   CONSULTED AND AGREED WITH PLAN OF CARE: Patient  PLAN FOR NEXT SESSION:  Again perform progress assessments of grip strength, motion and functional ability, consider need for additional therapy.  If she still has significant issues OT will advise her to seek surgery.  If issues continue to decline OT will advise her to continue maintenance program.  Next visit will likely be her last required visit  Benito Mccreedy, OTR/L, CHT 08/09/2022, 3:15 PM    OCCUPATIONAL THERAPY DISCHARGE SUMMARY  Visits from Start of Care: 5 She did not return to therapy and is discharged at this point. See notes for details.   Benito Mccreedy, OTR/L, CHT 10/16/22

## 2022-08-09 ENCOUNTER — Ambulatory Visit (INDEPENDENT_AMBULATORY_CARE_PROVIDER_SITE_OTHER): Payer: Medicare Other | Admitting: Rehabilitative and Restorative Service Providers"

## 2022-08-09 ENCOUNTER — Encounter: Payer: Self-pay | Admitting: Rehabilitative and Restorative Service Providers"

## 2022-08-09 DIAGNOSIS — M6281 Muscle weakness (generalized): Secondary | ICD-10-CM | POA: Diagnosis not present

## 2022-08-09 DIAGNOSIS — R278 Other lack of coordination: Secondary | ICD-10-CM

## 2022-08-09 DIAGNOSIS — M25642 Stiffness of left hand, not elsewhere classified: Secondary | ICD-10-CM | POA: Diagnosis not present

## 2022-08-09 DIAGNOSIS — M25542 Pain in joints of left hand: Secondary | ICD-10-CM | POA: Diagnosis not present

## 2022-08-15 ENCOUNTER — Ambulatory Visit (INDEPENDENT_AMBULATORY_CARE_PROVIDER_SITE_OTHER): Payer: Medicare Other

## 2022-08-15 ENCOUNTER — Telehealth: Payer: Self-pay

## 2022-08-15 VITALS — Ht 66.0 in | Wt 212.0 lb

## 2022-08-15 DIAGNOSIS — Z0000 Encounter for general adult medical examination without abnormal findings: Secondary | ICD-10-CM

## 2022-08-15 NOTE — Progress Notes (Signed)
Subjective:   Jillian [name] is a 66 y.o. female who presents for an Initial Medicare Annual Wellness Visit.  Review of Systems    No ROS.  Medicare Wellness Virtual Visit.  Visual/audio telehealth visit, UTA vital signs.   See social history for additional risk factors.   Cardiac Risk Factors include: advanced age (>46mn, >>30women)     Objective:    Today's Vitals   08/15/22 1441  Weight: 212 lb (96.2 kg)  Height: '5\' 6"'$  (1.676 m)   Body mass index is 34.22 kg/m.     08/15/2022    2:53 PM 06/12/2022    2:48 PM 07/26/2021    9:06 AM 11/06/2019   10:16 AM 07/26/2017    4:50 PM  Advanced Directives  Does Patient Have a Medical Advance Directive? Yes Yes Yes No No  Type of AParamed[place]of [place] will Healthcare Power of [place] will    Does patient want to make changes to medical advance directive? No - Patient declined No - Patient declined Yes (MAU/Ambulatory/Procedural Areas - Information given)    Copy of [place] Chart? Yes - validated most recent copy scanned in chart (See row information)      Would patient like information on creating a medical advance directive?    Yes (MAU/Ambulatory/Procedural Areas - Information given) No - Patient declined    Current Medications (verified) Outpatient Encounter Medications as of 08/15/2022  Medication Sig   Ascorbic Acid (VITAMIN C) 1000 MG tablet Take 1,000 mg by mouth daily.   aspirin EC 81 MG tablet Take 81 mg by mouth daily.   atorvastatin (LIPITOR) 20 MG tablet Take 1 tablet (20 mg total) by mouth every evening. for cholesterol   buPROPion (WELLBUTRIN SR) 200 MG 12 hr tablet TAKE ONE TABLET BY MOUTH EVERY DAY FOR DEPRESSION   CALCIUM-MAGNESIUM-ZINC PO Take 1 capsule by mouth daily.   cetirizine (ZYRTEC) 10 MG tablet Take 1 tablet (10 mg total) by mouth daily as needed.   Coenzyme Q10 (CO Q 10) 100 MG CAPS Take 1 capsule by  mouth daily.   fluocinonide-emollient (LIDEX-E) 0.05 % cream APPLY  CREAM EXTERNALLY TWICE DAILY TO  AFFECTED  AREA   fluticasone (FLONASE) 50 MCG/ACT nasal spray Place 1 spray into both nostrils daily as needed.   GARLIC PO Take 6789mg by mouth daily.   Hydroquinone 8 % EMUL Apply topically.   meloxicam (MOBIC) 15 MG tablet Take 1 tablet (15 mg total) by mouth daily.   metFORMIN (GLUCOPHAGE) 500 MG tablet Take 1 tablet (500 mg total) by mouth 2 (two) times daily with a meal. For blood sugar.   Multiple Vitamins-Minerals (ALIVE ONCE DAILY WOMENS 50+) TABS Take 1 tablet by mouth daily.   NONFORMULARY OR COMPOUNDED ITEM Apply topically. Hydroquinone 8% compound cream sent by Dermatology to [facility]   Omega-3 1000 MG CAPS Take 1 capsule by mouth daily.   Probiotic Product (PROBIOTIC-10) CAPS Take 1 capsule by mouth daily.   Turmeric Curcumin 500 MG CAPS Take 1 capsule by mouth daily.   vitamin E 400 UNIT capsule Take 400 Units by mouth daily.   No facility-administered encounter medications on file as of 08/15/2022.    Allergies (verified) Patient has no known allergies.   History: Past Medical History:  Diagnosis Date   Allergic rhinitis    Allergy    Arthritis    Arthritis of low back    Back pain  Cataract    Cataracts, bilateral    Constipation    Depression    Dry skin    on hands   Fibroid uterus    Hyperlipidemia    Joint pain    Osteoarthritis of right shoulder    Pre-diabetes    Ringing in ears    Sciatica    sciatica   Vitamin D deficiency    Past Surgical History:  Procedure Laterality Date   ABDOMINAL HYSTERECTOMY     broken ankle     CARPAL TUNNEL RELEASE Bilateral    CATARACT EXTRACTION W/ INTRAOCULAR LENS IMPLANT Bilateral 05/22/2021   2nd eye done 07/03/21   SHOULDER SURGERY Right    SKIN BIOPSY Right 10/2021   Family History  Problem Relation Age of Onset   Diabetes Mother        father and 9 siblings   Kidney disease Mother        renal  failure   Hypertension Mother    Hyperlipidemia Mother    Stroke Mother    Depression Mother    Anxiety disorder Mother    Obesity Mother    Heart failure Mother    Heart disease Father        paternal grandmother   Obesity Father    Diabetes Father    Hypertension Father    Hyperlipidemia Father    Eating disorder Father    Colon cancer Sister    Kidney cancer Sister    Colon polyps Sister    Prostate cancer Brother    Esophageal cancer Neg Hx    Rectal cancer Neg Hx    Stomach cancer Neg Hx    Social History   Socioeconomic History   Marital status: Married    Spouse name: [name]   Number of children: 0   Years of education: Not on file   Highest education level: Not on file  Occupational History   Occupation: Retired [employer]  Tobacco Use   Smoking status: Former    Packs/day: 1.00    Years: 29.00    Total pack years: 29.00    Types: Cigarettes   Smokeless tobacco: Never   Tobacco comments:    30 years  Vaping Use   Vaping Use: Never used  Substance and Sexual Activity   Alcohol use: No   Drug use: No   Sexual activity: Not on file  Other Topics Concern   Not on file  Social History Narrative   Not on file   Social Determinants of Health   Financial Resource Strain: Low Risk  (08/15/2022)   Overall Financial Resource Strain (CARDIA)    Difficulty of Paying Living Expenses: Not hard at all  Food Insecurity: No Food Insecurity (08/15/2022)   Hunger Vital Sign    Worried About Running Out of Food in the Last Year: Never true    [place] in the Last Year: Never true  Transportation Needs: No Transportation Needs (08/15/2022)   PRAPARE - Hydrologist (Medical): No    Lack of Transportation (Non-Medical): No  Physical Activity: Insufficiently Active (07/26/2021)   Exercise Vital Sign    Days of Exercise per Week: 3 days    Minutes of Exercise per Session: 30 min  Stress: No Stress Concern Present  (08/15/2022)   [place]    Feeling of Stress : Not at all  Social Connections: [place] (08/15/2022)  Social Licensed conveyancer [NHANES]    Frequency of Communication with Friends and Family: More than three times a week    Frequency of Social Gatherings with Friends and Family: Once a week    Attends Religious Services: More than 4 times per year    Active Member of [employer] or Organizations: Yes    Attends Music therapist: More than 4 times per year    Marital Status: Married    Tobacco Counseling Counseling given: Not Answered Tobacco comments: 30 years   Clinical Intake:  Pre-visit preparation completed: Yes        Diabetes: No  How often do you need to have someone help you when you read instructions, pamphlets, or other written materials from your doctor or pharmacy?: 1 - Never    Interpreter Needed?: No    Activities of Daily Living    08/15/2022    2:54 PM  In your present state of health, do you have any difficulty performing the following activities:  Hearing? 0  Vision? 0  Difficulty concentrating or making decisions? 0  Walking or climbing stairs? 0  Dressing or bathing? 0  Doing errands, shopping? 0  Preparing Food and eating ? N  Using the Toilet? N  In the past six months, have you accidently leaked urine? N  Do you have problems with loss of bowel control? N  Managing your Medications? N  Managing your Finances? N  Housekeeping or managing your Housekeeping? N    Patient Care Team: Pleas Koch, NP as PCP - General (Internal Medicine) Ree Edman, MD as Consulting Physician (Dermatology) Macarthur Critchley, [place] as Referring Physician (Optometry)  Indicate any recent Medical Services you may have received from other than [facility] providers in the past year (date may be approximate).     Assessment:   This is a routine wellness  examination for Sahana.  I connected with  KERISSA COIA on 08/15/22 by a audio enabled telemedicine application and verified that I am speaking with the correct person using two identifiers.  Patient Location: Home  Provider Location: Office/Clinic  I discussed the limitations of evaluation and management by telemedicine. The patient expressed understanding and agreed to proceed.   Hearing/Vision screen Hearing Screening - Comments:: Patient is able to hear conversational tones without difficulty.  No issues reported.   Vision Screening - Comments:: 06/2021, Dr. Arlis Porta , bilateral cataract done with [facility]  Next scheduled appointment 09/2021 with [facility], Dr. Nicki Reaper  Dietary issues and exercise activities discussed:   Healthy weight and wellness program  Good water intake   Goals Addressed             This Visit's Progress    Increase physical activity       As tolerated       Depression Screen    08/15/2022    2:49 PM 08/01/2021   10:22 AM 07/26/2021    9:11 AM 12/28/2020    9:24 AM 07/20/2020    9:08 AM 06/12/2018    8:32 AM 05/28/2017   12:11 PM  PHQ 2/9 Scores  PHQ - 2 Score 0 0 0 2 0 2 0  PHQ- 9 Score  0  '4 3 9 '$ 0    Fall Risk    08/15/2022    2:53 PM 07/26/2021    9:09 AM 07/20/2020    9:08 AM 05/28/2017   12:11 PM  Fall Risk   Falls in the past year?  0 0 0 No  Number falls in past yr: 0 0 0   Injury with Fall? 0 0 0   Risk for fall due to :  No Fall Risks    Follow up Falls evaluation completed;Falls prevention discussed Falls prevention discussed      FALL RISK PREVENTION PERTAINING TO THE HOME: Home free of loose throw rugs in walkways, pet beds, electrical cords, etc? Yes  Adequate lighting in your home to reduce risk of falls? Yes   ASSISTIVE DEVICES UTILIZED TO PREVENT FALLS: Life alert? No  Use of a cane, walker or w/c? No  Grab bars in the bathroom? Yes Shower chair or bench in shower? No  Elevated toilet  seat or a handicapped toilet? No   TIMED UP AND GO: Was the test performed? No .   Cognitive Function:        08/15/2022    2:55 PM  6CIT Screen  What Year? 0 points  What month? 0 points  What time? 0 points  Count back from 20 0 points  Months in reverse 0 points  Repeat phrase 0 points  Total Score 0 points    Immunizations Immunization History  Administered Date(s) Administered   Fluad Quad(high Dose 65+) 05/12/2021, 05/12/2021, 05/22/2022   Influenza Split 07/16/2011, 05/28/2012   Influenza,inj,Quad PF,6+ Mos 06/02/2013, 05/26/2014, 06/23/2015, 05/25/2016, 05/28/2017, 06/02/2018, 06/08/2019, 05/28/2020   PFIZER(Purple Top)SARS-COV-2 Vaccination 11/19/2019, 12/14/2019, 07/02/2020, 12/19/2020   PNEUMOCOCCAL CONJUGATE-20 08/01/2021   Pfizer Covid-19 Vaccine Bivalent Booster 53yr & up 06/11/2021   RSV,unspecified 06/01/2022   Td 10/07/1996, 03/01/2009   Tdap 06/08/2019   Unspecified SARS-COV-2 Vaccination 05/28/2022   Zoster Recombinat (Shingrix) 09/09/2018, 11/16/2018   Zoster, Live 11/10/2013   Screening Tests Health Maintenance  Topic Date Due   COVID-19 Vaccine (7 - 2023-24 season) 08/23/2022 (Originally 07/23/2022)   Lung Cancer Screening  08/16/2023 (Originally 12/23/2005)   COLONOSCOPY (Pts 45-47yrInsurance coverage will need to be confirmed)  06/19/2023   MAMMOGRAM  07/11/2023   Medicare Annual Wellness (AWV)  08/16/2023   DTaP/Tdap/Td (4 - Td or Tdap) 06/07/2029   Pneumonia Vaccine 6579Years old  Completed   INFLUENZA VACCINE  Completed   DEXA SCAN  Completed   Hepatitis C Screening  Completed   Zoster Vaccines- Shingrix  Completed   HPV VACCINES  Aged Out   Health Maintenance There are no preventive care reminders to display for this patient.  Lung Cancer Screening: deferred per patient preference.   Hepatitis C Screening: Completed 05/2017.  Vision Screening: Recommended annual ophthalmology exams for early detection of glaucoma and other  disorders of the eye.  Dental Screening: Recommended annual dental exams for proper oral hygiene  Community Resource Referral / Chronic Care Management: CRR required this visit?  No   CCM required this visit?  No      Plan:     I have personally reviewed and noted the following in the patient's chart:   Medical and social history Use of alcohol, tobacco or illicit drugs  Current medications and supplements including opioid prescriptions. Patient is not currently taking opioid prescriptions. Functional ability and status Nutritional status Physical activity Advanced directives List of other physicians Hospitalizations, surgeries, and ER visits in previous 12 months Vitals Screenings to include cognitive, depression, and falls Referrals and appointments  In addition, I have reviewed and discussed with patient certain preventive protocols, quality metrics, and best practice recommendations. A written personalized care plan for preventive services as well as general preventive health recommendations  were provided to patient.     Leta Jungling, LPN   03/[id number]/[id number]

## 2022-08-15 NOTE — Patient Instructions (Addendum)
Jillian [name] , Thank you for taking time to come for your Medicare Wellness Visit. I appreciate your ongoing commitment to your health goals. Please review the following plan we discussed and let me know if I can assist you in the future.   These are the goals we discussed:    Goals Addressed             This Visit's Progress    Increase physical activity       As tolerated       This is a list of the screening recommended for you and due dates:  Health Maintenance  Topic Date Due   COVID-19 Vaccine (7 - 2023-24 season) 08/23/2022*   Screening for Lung Cancer  08/16/2023*   Colon Cancer Screening  06/19/2023   Mammogram  07/11/2023   Medicare Annual Wellness Visit  08/16/2023   DTaP/Tdap/Td vaccine (4 - Td or Tdap) 06/07/2029   Pneumonia Vaccine  Completed   Flu Shot  Completed   DEXA scan (bone density measurement)  Completed   Hepatitis C Screening: USPSTF Recommendation to screen - Ages 85-79 yo.  Completed   Zoster (Shingles) Vaccine  Completed   HPV Vaccine  Aged Out  *Topic was postponed. The date shown is not the original due date.   Next appointment: Follow up in one year for your annual wellness visit    Preventive Care 65 Years and Older, Female Preventive care refers to lifestyle choices and visits with your health care provider that can promote health and wellness. What does preventive care include? A yearly physical exam. This is also called an annual well check. Dental exams once or twice a year. Routine eye exams. Ask your health care provider how often you should have your eyes checked. Personal lifestyle choices, including: Daily care of your teeth and gums. Regular physical activity. Eating a healthy diet. Avoiding tobacco and drug use. Limiting alcohol use. Practicing safe sex. Taking low-dose aspirin every day. Taking vitamin and mineral supplements as recommended by your health care provider. What happens during an annual well check? The  services and screenings done by your health care provider during your annual well check will depend on your age, overall health, lifestyle risk factors, and family history of disease. Counseling  Your health care provider may ask you questions about your: Alcohol use. Tobacco use. Drug use. Emotional well-being. Home and relationship well-being. Sexual activity. Eating habits. History of falls. Memory and ability to understand (cognition). Work and work Statistician. Reproductive health. Screening  You may have the following tests or measurements: Height, weight, and BMI. Blood pressure. Lipid and cholesterol levels. These may be checked every 5 years, or more frequently if you are over 49 years old. Skin check. Lung cancer screening. You may have this screening every year starting at age 65 if you have a 30-pack-year history of smoking and currently smoke or have quit within the past 15 years. Fecal occult blood test (FOBT) of the stool. You may have this test every year starting at age 26. Flexible sigmoidoscopy or colonoscopy. You may have a sigmoidoscopy every 5 years or a colonoscopy every 10 years starting at age 56. Hepatitis C blood test. Hepatitis B blood test. Sexually transmitted disease (STD) testing. Diabetes screening. This is done by checking your blood sugar (glucose) after you have not eaten for a while (fasting). You may have this done every 1-3 years. Bone density scan. This is done to screen for osteoporosis. You may have this done  starting at age 27. Mammogram. This may be done every 1-2 years. Talk to your health care provider about how often you should have regular mammograms. Talk with your health care provider about your test results, treatment options, and if necessary, the need for more tests. Vaccines  Your health care provider may recommend certain vaccines, such as: Influenza vaccine. This is recommended every year. Tetanus, diphtheria, and acellular  pertussis (Tdap, Td) vaccine. You may need a Td booster every 10 years. Zoster vaccine. You may need this after age 57. Pneumococcal 13-valent conjugate (PCV13) vaccine. One dose is recommended after age 37. Pneumococcal polysaccharide (PPSV23) vaccine. One dose is recommended after age 16. Talk to your health care provider about which screenings and vaccines you need and how often you need them. This information is not intended to replace advice given to you by your health care provider. Make sure you discuss any questions you have with your health care provider. Document Released: 09/02/2015 Document Revised: 04/25/2016 Document Reviewed: 06/07/2015 Elsevier Interactive Patient Education  2017 [place] Prevention in the Home Falls can cause injuries. They can happen to people of all ages. There are many things you can do to make your home safe and to help prevent falls. What can I do on the outside of my home? Regularly fix the edges of walkways and driveways and fix any cracks. Remove anything that might make you trip as you walk through a door, such as a raised step or threshold. Trim any bushes or trees on the path to your home. Use bright outdoor lighting. Clear any walking paths of anything that might make someone trip, such as rocks or tools. Regularly check to see if handrails are loose or broken. Make sure that both sides of any steps have handrails. Any raised decks and porches should have guardrails on the edges. Have any leaves, snow, or ice cleared regularly. Use sand or salt on walking paths during winter. Clean up any spills in your garage right away. This includes oil or grease spills. What can I do in the bathroom? Use night lights. Install grab bars by the toilet and in the tub and shower. Do not use towel bars as grab bars. Use non-skid mats or decals in the tub or shower. If you need to sit down in the shower, use a plastic, non-slip stool. Keep the floor  dry. Clean up any water that spills on the floor as soon as it happens. Remove soap buildup in the tub or shower regularly. Attach bath mats securely with double-sided non-slip rug tape. Do not have throw rugs and other things on the floor that can make you trip. What can I do in the bedroom? Use night lights. Make sure that you have a light by your bed that is easy to reach. Do not use any sheets or blankets that are too big for your bed. They should not hang down onto the floor. Have a firm chair that has side arms. You can use this for support while you get dressed. Do not have throw rugs and other things on the floor that can make you trip. What can I do in the kitchen? Clean up any spills right away. Avoid walking on wet floors. Keep items that you use a lot in easy-to-reach places. If you need to reach something above you, use a strong step stool that has a grab bar. Keep electrical cords out of the way. Do not use floor polish or wax that  makes floors slippery. If you must use wax, use non-skid floor wax. Do not have throw rugs and other things on the floor that can make you trip. What can I do with my stairs? Do not leave any items on the stairs. Make sure that there are handrails on both sides of the stairs and use them. Fix handrails that are broken or loose. Make sure that handrails are as long as the stairways. Check any carpeting to make sure that it is firmly attached to the stairs. Fix any carpet that is loose or worn. Avoid having throw rugs at the top or bottom of the stairs. If you do have throw rugs, attach them to the floor with carpet tape. Make sure that you have a light switch at the top of the stairs and the bottom of the stairs. If you do not have them, ask someone to add them for you. What else can I do to help prevent falls? Wear shoes that: Do not have high heels. Have rubber bottoms. Are comfortable and fit you well. Are closed at the toe. Do not wear  sandals. If you use a stepladder: Make sure that it is fully opened. Do not climb a closed stepladder. Make sure that both sides of the stepladder are locked into place. Ask someone to hold it for you, if possible. Clearly mark and make sure that you can see: Any grab bars or handrails. First and last steps. Where the edge of each step is. Use tools that help you move around (mobility aids) if they are needed. These include: Canes. Walkers. Scooters. Crutches. Turn on the lights when you go into a dark area. Replace any light bulbs as soon as they burn out. Set up your furniture so you have a clear path. Avoid moving your furniture around. If any of your floors are uneven, fix them. If there are any pets around you, be aware of where they are. Review your medicines with your doctor. Some medicines can make you feel dizzy. This can increase your chance of falling. Ask your doctor what other things that you can do to help prevent falls. This information is not intended to replace advice given to you by your health care provider. Make sure you discuss any questions you have with your health care provider. Document Released: 06/02/2009 Document Revised: 01/12/2016 Document Reviewed: 09/10/2014 Elsevier Interactive Patient Education  2017 [employer].

## 2022-08-15 NOTE — Telephone Encounter (Signed)
Unable to reach patient for scheduled AWV. No answer. All phone lines go straight to voicemail. Left message. Okay to reschedule.

## 2022-08-21 ENCOUNTER — Encounter (INDEPENDENT_AMBULATORY_CARE_PROVIDER_SITE_OTHER): Payer: Self-pay | Admitting: Family Medicine

## 2022-08-21 ENCOUNTER — Encounter: Payer: Medicare Other | Admitting: Rehabilitative and Restorative Service Providers"

## 2022-08-21 ENCOUNTER — Ambulatory Visit (INDEPENDENT_AMBULATORY_CARE_PROVIDER_SITE_OTHER): Payer: Medicare Other | Admitting: Family Medicine

## 2022-08-21 ENCOUNTER — Ambulatory Visit (INDEPENDENT_AMBULATORY_CARE_PROVIDER_SITE_OTHER): Payer: Medicare Other | Admitting: Orthopedic Surgery

## 2022-08-21 ENCOUNTER — Ambulatory Visit: Payer: Medicare Other

## 2022-08-21 VITALS — BP 149/90 | HR 98 | Temp 98.8°F | Ht 66.0 in

## 2022-08-21 DIAGNOSIS — I10 Essential (primary) hypertension: Secondary | ICD-10-CM

## 2022-08-21 DIAGNOSIS — R7303 Prediabetes: Secondary | ICD-10-CM

## 2022-08-21 DIAGNOSIS — E669 Obesity, unspecified: Secondary | ICD-10-CM | POA: Diagnosis not present

## 2022-08-21 DIAGNOSIS — Z6834 Body mass index (BMI) 34.0-34.9, adult: Secondary | ICD-10-CM

## 2022-08-21 DIAGNOSIS — M79645 Pain in left finger(s): Secondary | ICD-10-CM

## 2022-08-21 DIAGNOSIS — Z6833 Body mass index (BMI) 33.0-33.9, adult: Secondary | ICD-10-CM

## 2022-08-21 DIAGNOSIS — M65332 Trigger finger, left middle finger: Secondary | ICD-10-CM

## 2022-08-21 MED ORDER — METFORMIN HCL 500 MG PO TABS: 500.0000 mg | ORAL_TABLET | Freq: Two times a day (BID) | ORAL | 0 refills | Status: DC

## 2022-08-22 ENCOUNTER — Encounter: Payer: Self-pay | Admitting: Orthopedic Surgery

## 2022-08-22 DIAGNOSIS — M65332 Trigger finger, left middle finger: Secondary | ICD-10-CM | POA: Diagnosis not present

## 2022-08-22 NOTE — Progress Notes (Signed)
Office Visit Note   Patient: Jillian [name]           Date of Birth: 29-Apr-1956           MRN: [id number] Visit Date: 08/21/2022 Requested by: Pleas Koch, NP [place],  [place] [id number] PCP: Pleas Koch, NP  Subjective: Chief Complaint  Patient presents with   Left Hand - Follow-up   Left Foot - Follow-up    HPI: Jillian [name] is a 67 y.o. female who presents to the office reporting left middle finger pain as well as left foot pain.  She was seeing occupational therapy upstairs who is using a night splint and bandage during the day.  Feels like the progress has been very slow.  She is wondering about surgery at this time.  Does complain of pain depending on activity.  Patient also describes left foot pain.  Not really doing much better.  She does use a pad in the foot.  Localizes pain to the plantar MTP region 2 and 3.  She is currently wearing [employer] shoes which have very good arches..                ROS: All systems reviewed are negative as they relate to the chief complaint within the history of present illness.  Patient denies fevers or chills.  Assessment & Plan: Visit Diagnoses:  1. Pain in left finger(s)   2. Trigger finger, left middle finger     Plan: Impression is left middle finger trigger finger along with PIP arthritis.  Ultrasound-guided injection performed today.  She does have walking on a daily basis.  That is been going on for 2 months.  I think the injection has a 50-50 chance of helping her.  The foot will be addressed with a custom insert molded to imitate a current brace that she has.  I think if we can make her molded insert which mimics what she usually uses but is somewhat unstable around her foot and she may have sustained relief.  She will follow-up with [place] as needed.  Follow-Up Instructions: No follow-ups on file.   Orders:  No orders of the defined types were placed in this encounter.  No orders of the  defined types were placed in this encounter.     Procedures: Hand/UE Inj for trigger finger on 08/22/2022 6:07 PM Indications: therapeutic Details: 25 G needle, volar approach Medications: 0.33 mL bupivacaine 0.25 %; 13.33 mg methylPREDNISolone acetate 40 MG/ML; 3 mL lidocaine 1 % Outcome: tolerated well, no immediate complications Procedure, treatment alternatives, risks and benefits explained, specific risks discussed. Consent was given by the patient. Immediately prior to procedure a time out was called to verify the correct patient, procedure, equipment, support staff and site/side marked as required. Patient was prepped and draped in the usual sterile fashion.    Ultrasound-guided tendon sheath injection performed between the tendon and the A1 pulley   Clinical Data: No additional findings.  Objective: Vital Signs: There were no vitals taken for this visit.  Physical Exam:  Constitutional: Patient appears well-developed HEENT:  Head: Normocephalic Eyes:EOM are normal Neck: Normal range of motion Cardiovascular: Normal rate Pulmonary/chest: Effort normal Neurologic: Patient is alert Skin: Skin is warm Psychiatric: Patient has normal mood and affect  Ortho Exam: Ortho exam demonstrates tenderness around the A1 pulley of the left middle finger.  She also has some swelling around the PIP joint of that left middle finger consistent with  arthritis.  Locking is present consistent with trigger finger and stenosing tenosynovitis of that digit.  Left foot demonstrates mild tenderness but no discrete swelling around the second and third MTP joint.  No lesser toe deformities.  Pedal pulses palpable.  Slightly diminished arches but no heel cord tightness present.  Specialty Comments:  No specialty comments available.  Imaging: No results found.   PMFS History: Patient Active Problem List   Diagnosis Date Noted   Chronic hand pain 08/07/2022   Essential hypertension 06/21/2022    Other hyperlipidemia 04/02/2022   Sciatica 03/19/2021   Age-related nuclear cataract of both eyes 11/08/2020   Elevated blood pressure reading in office without diagnosis of hypertension 07/20/2020   Prediabetes 02/05/2019   Depression 02/05/2019   Dry skin 06/02/2018   Pelvic pain 02/19/2018   Chronic back pain 08/09/2017   Primary osteoarthritis of right shoulder 09/20/2016   Alopecia 01/10/2016   Class 1 obesity with serious comorbidity and body mass index (BMI) of 34.0 to 34.9 in adult 09/20/2015   Preventative health care 09/08/2015   Former smoker 09/08/2014   HLD (hyperlipidemia) 09/01/2013   Vitamin D deficiency 07/03/2010   Vaginal atrophy 06/30/2009   Allergic rhinitis 03/01/2009   Past Medical History:  Diagnosis Date   Allergic rhinitis    Allergy    Arthritis    Arthritis of low back    Back pain    Cataract    Cataracts, bilateral    Constipation    Depression    Dry skin    on hands   Fibroid uterus    Hyperlipidemia    Joint pain    Osteoarthritis of right shoulder    Pre-diabetes    Ringing in ears    Sciatica    sciatica   Vitamin D deficiency     Family History  Problem Relation Age of Onset   Diabetes Mother        father and 20 siblings   Kidney disease Mother        renal failure   Hypertension Mother    Hyperlipidemia Mother    Stroke Mother    Depression Mother    Anxiety disorder Mother    Obesity Mother    Heart failure Mother    Heart disease Father        paternal grandmother   Obesity Father    Diabetes Father    Hypertension Father    Hyperlipidemia Father    Eating disorder Father    Colon cancer Sister    Kidney cancer Sister    Colon polyps Sister    Prostate cancer Brother    Esophageal cancer Neg Hx    Rectal cancer Neg Hx    Stomach cancer Neg Hx     Past Surgical History:  Procedure Laterality Date   ABDOMINAL HYSTERECTOMY     broken ankle     CARPAL TUNNEL RELEASE Bilateral    CATARACT EXTRACTION W/  INTRAOCULAR LENS IMPLANT Bilateral 05/22/2021   2nd eye done 07/03/21   SHOULDER SURGERY Right    SKIN BIOPSY Right 10/2021   Social History   Occupational History   Occupation: Retired [place] [employer]  Tobacco Use   Smoking status: Former    Packs/day: 1.00    Years: 29.00    Total pack years: 29.00    Types: Cigarettes   Smokeless tobacco: Never   Tobacco comments:    30 years  Vaping Use   Vaping Use: Never used  Substance and Sexual Activity   Alcohol use: No   Drug use: No   Sexual activity: Not on file

## 2022-08-23 ENCOUNTER — Encounter: Payer: Medicare Other | Admitting: Rehabilitative and Restorative Service Providers"

## 2022-08-25 MED ORDER — LIDOCAINE HCL 1 % IJ SOLN
3.0000 mL | INTRAMUSCULAR | Status: AC | PRN
  Administered 2022-08-22: 3 mL

## 2022-08-25 MED ORDER — BUPIVACAINE HCL 0.25 % IJ SOLN
0.3300 mL | INTRAMUSCULAR | Status: AC | PRN
  Administered 2022-08-22: .33 mL

## 2022-08-25 MED ORDER — METHYLPREDNISOLONE ACETATE 40 MG/ML IJ SUSP
13.3300 mg | INTRAMUSCULAR | Status: AC | PRN
  Administered 2022-08-22: 13.33 mg

## 2022-09-03 NOTE — Progress Notes (Signed)
Chief Complaint:   OBESITY Jillian [name] is here to discuss her progress with her obesity treatment plan along with follow-up of her obesity related diagnoses. Jillian [name] is on the Category 2 Plan and states she is following her eating plan approximately 60% of the time. Jillian [name] states she is doing 0 minutes 0 times per week.  Today's visit was #: 24 Starting weight: 223 lbs Starting date: 12/29/2019 Today's weight: 207 lbs Today's date: 08/21/2022 Total lbs lost to date: 16 Total lbs lost since last in-office visit: 0  Interim History: Jillian [name] did very well with avoiding holiday weight gain.  She is ready to get back to her category 2 plan more strictly, and get back to weight loss.  Subjective:   1. Essential hypertension Jillian [name]'s blood pressure is elevated today.  She may have whitecoat syndrome as her home blood pressure readings have been in normal range.  2. Prediabetes Jillian [name] is stable on metformin, and she is working on decreasing simple carbohydrates in her diet.  Assessment/Plan:   1. Essential hypertension Jillian [name] is to bring in her blood pressure cuff and compare her readings to ours.  She will work on her diet and weight loss to help improve her blood pressure.  2. Prediabetes Jillian [name] will continue metformin, and we will refill for 1 month.  - metFORMIN (GLUCOPHAGE) 500 MG tablet; Take 1 tablet (500 mg total) by mouth 2 (two) times daily with a meal. For blood sugar.  Dispense: 60 tablet; Refill: 0  3. Obesity, Current BMI 33.4 Jillian [name] is currently in the action stage of change. As such, her goal is to continue with weight loss efforts. She has agreed to the Category 2 Plan.   Behavioral modification strategies: increasing lean protein intake and meal planning and cooking strategies.  Jillian [name] has agreed to follow-up with our clinic in 4 weeks. She was informed of the importance of frequent follow-up visits to maximize her success with intensive lifestyle modifications  for her multiple health conditions.   Objective:   Blood pressure (!) 149/90, pulse 98, temperature 98.8 F (37.1 C), height 5\' 6"  (1.676 m), SpO2 98 %. Body mass index is 34.22 kg/m.  General: Cooperative, alert, well developed, in no acute distress. HEENT: Conjunctivae and lids unremarkable. Cardiovascular: Regular rhythm.  Lungs: Normal work of breathing. Neurologic: No focal deficits.   Lab Results  Component Value Date   CREATININE 0.93 07/17/2022   BUN 19 07/17/2022   NA 142 07/17/2022   K 4.7 07/17/2022   CL 105 07/17/2022   CO2 23 07/17/2022   Lab Results  Component Value Date   ALT 19 07/17/2022   AST 22 07/17/2022   ALKPHOS 78 07/17/2022   BILITOT 0.3 07/17/2022   Lab Results  Component Value Date   HGBA1C 5.7 (H) 07/17/2022   HGBA1C 5.6 03/05/2022   HGBA1C 5.7 (H) 11/06/2021   HGBA1C 5.6 07/18/2021   HGBA1C 5.7 (H) 04/03/2021   Lab Results  Component Value Date   INSULIN 13.0 07/17/2022   INSULIN 7.8 03/05/2022   INSULIN 11.6 11/06/2021   INSULIN 10.9 07/18/2021   INSULIN 12.6 04/03/2021   Lab Results  Component Value Date   TSH 2.670 07/18/2021   Lab Results  Component Value Date   CHOL 203 (H) 07/17/2022   HDL 92 07/17/2022   LDLCALC 103 (H) 07/17/2022   LDLDIRECT 158.6 08/18/2013   TRIG 39 07/17/2022   CHOLHDL 2 07/11/2020   Lab Results  Component Value Date   VD25OH 56.2  07/17/2022   VD25OH 63.1 03/05/2022   VD25OH 83.1 11/06/2021   Lab Results  Component Value Date   WBC 4.3 03/05/2022   HGB 13.5 03/05/2022   HCT 41.9 03/05/2022   MCV 88 03/05/2022   PLT 236 03/05/2022   No results found for: "IRON", "TIBC", "FERRITIN"  Attestation Statements:   Reviewed by clinician on day of visit: allergies, medications, problem list, medical history, surgical history, family history, social history, and previous encounter notes.   I, Burt Knack, am acting as transcriptionist for Quillian Quince, MD.  I have reviewed the above  documentation for accuracy and completeness, and I agree with the above. -  Quillian Quince, MD

## 2022-09-27 ENCOUNTER — Encounter (INDEPENDENT_AMBULATORY_CARE_PROVIDER_SITE_OTHER): Payer: Self-pay | Admitting: Family Medicine

## 2022-09-27 ENCOUNTER — Ambulatory Visit (INDEPENDENT_AMBULATORY_CARE_PROVIDER_SITE_OTHER): Payer: Medicare Other | Admitting: Family Medicine

## 2022-09-27 VITALS — BP 151/79 | HR 89 | Temp 98.0°F | Ht 66.0 in | Wt 209.0 lb

## 2022-09-27 DIAGNOSIS — Z6833 Body mass index (BMI) 33.0-33.9, adult: Secondary | ICD-10-CM | POA: Diagnosis not present

## 2022-09-27 DIAGNOSIS — I10 Essential (primary) hypertension: Secondary | ICD-10-CM | POA: Diagnosis not present

## 2022-09-27 DIAGNOSIS — E669 Obesity, unspecified: Secondary | ICD-10-CM

## 2022-09-27 DIAGNOSIS — F3289 Other specified depressive episodes: Secondary | ICD-10-CM

## 2022-09-27 MED ORDER — CHLORTHALIDONE 25 MG PO TABS: 25.0000 mg | ORAL_TABLET | Freq: Every morning | ORAL | 0 refills | Status: DC

## 2022-10-04 LAB — HM DIABETES EYE EXAM

## 2022-10-10 NOTE — Progress Notes (Unsigned)
Chief Complaint:   OBESITY Jillian [name] is here to discuss her progress with her obesity treatment plan along with follow-up of her obesity related diagnoses. Jillian [name] is on the Category 2 Plan and states she is following her eating plan approximately 65% of the time. Jillian [name] states she is doing high intensity exercise for 15 minutes.  Today's visit was #: 25 Starting weight: 223 lbs Starting date: 12/29/2019 Today's weight: 209 lbs Today's date: 09/27/2022 Total lbs lost to date: 14 Total lbs lost since last in-office visit: 0  Interim History: Jillian [name] has struggles to stay on track with her eating plan. She has been out of her normal routine. She is working on avoiding weight gain while she is able to get back to a structured routine.   Subjective:   1. Essential hypertension Jillian [name]'s blood pressure is elevated today. She is on Wellbutrin which may be contributing but her stress is also high.   2. Emotional Eating Behavior Jillian [name]'s dog is sick and her stress is increased. She is working on decreasing emotional eating behaviors but this has been difficult.   Assessment/Plan:   1. Essential hypertension Jillian [name] agreed to start chlorthalidone 25 mg q AM with no refills. May need to change Wellbutrin if her blood pressure stays elevated.   - chlorthalidone (HYGROTON) 25 MG tablet; Take 1 tablet (25 mg total) by mouth every morning.  Dispense: 90 tablet; Refill: 0  2. Emotional Eating Behavior Keelin will continue Wellbutrin for now, and we will recheck her blood pressure in 4 weeks.   3. BMI 33.0-33.9,adult  4. Obesity, Beginning BMI 34.22 Jillian [name] is currently in the action stage of change. As such, her goal is to continue with weight loss efforts. She has agreed to the Category 2 Plan.   Exercise goals: As is.   Behavioral modification strategies: no skipping meals and emotional eating strategies.  Jillian [name] has agreed to follow-up with our clinic in 4 weeks. She was informed  of the importance of frequent follow-up visits to maximize her success with intensive lifestyle modifications for her multiple health conditions.   Objective:   Blood pressure (!) 151/79, pulse 89, temperature 98 F (36.7 C), height 5' 6"$  (1.676 m), weight 209 lb (94.8 kg), SpO2 97 %. Body mass index is 33.73 kg/m.  General: Cooperative, alert, well developed, in no acute distress. HEENT: Conjunctivae and lids unremarkable. Cardiovascular: Regular rhythm.  Lungs: Normal work of breathing. Neurologic: No focal deficits.   Lab Results  Component Value Date   CREATININE 0.93 07/17/2022   BUN 19 07/17/2022   NA 142 07/17/2022   K 4.7 07/17/2022   CL 105 07/17/2022   CO2 23 07/17/2022   Lab Results  Component Value Date   ALT 19 07/17/2022   AST 22 07/17/2022   ALKPHOS 78 07/17/2022   BILITOT 0.3 07/17/2022   Lab Results  Component Value Date   HGBA1C 5.7 (H) 07/17/2022   HGBA1C 5.6 03/05/2022   HGBA1C 5.7 (H) 11/06/2021   HGBA1C 5.6 07/18/2021   HGBA1C 5.7 (H) 04/03/2021   Lab Results  Component Value Date   INSULIN 13.0 07/17/2022   INSULIN 7.8 03/05/2022   INSULIN 11.6 11/06/2021   INSULIN 10.9 07/18/2021   INSULIN 12.6 04/03/2021   Lab Results  Component Value Date   TSH 2.670 07/18/2021   Lab Results  Component Value Date   CHOL 203 (H) 07/17/2022   HDL 92 07/17/2022   LDLCALC 103 (H) 07/17/2022   LDLDIRECT 158.6 08/18/2013  TRIG 39 07/17/2022   CHOLHDL 2 07/11/2020   Lab Results  Component Value Date   VD25OH 56.2 07/17/2022   VD25OH 63.1 03/05/2022   VD25OH 83.1 11/06/2021   Lab Results  Component Value Date   WBC 4.3 03/05/2022   HGB 13.5 03/05/2022   HCT 41.9 03/05/2022   MCV 88 03/05/2022   PLT 236 03/05/2022   No results found for: "IRON", "TIBC", "FERRITIN"  Attestation Statements:   Reviewed by clinician on day of visit: allergies, medications, problem list, medical history, surgical history, family history, social history, and  previous encounter notes.   I, Trixie Dredge, am acting as transcriptionist for Dennard Nip, MD.  I have reviewed the above documentation for accuracy and completeness, and I agree with the above. -  Dennard Nip, MD

## 2022-10-11 ENCOUNTER — Encounter (INDEPENDENT_AMBULATORY_CARE_PROVIDER_SITE_OTHER): Payer: Self-pay | Admitting: Family Medicine

## 2022-10-25 ENCOUNTER — Encounter (INDEPENDENT_AMBULATORY_CARE_PROVIDER_SITE_OTHER): Payer: Self-pay | Admitting: Family Medicine

## 2022-10-25 ENCOUNTER — Ambulatory Visit (INDEPENDENT_AMBULATORY_CARE_PROVIDER_SITE_OTHER): Payer: Medicare Other | Admitting: Family Medicine

## 2022-10-25 VITALS — BP 119/75 | HR 89 | Temp 98.2°F | Ht 66.0 in | Wt 207.0 lb

## 2022-10-25 DIAGNOSIS — Z6833 Body mass index (BMI) 33.0-33.9, adult: Secondary | ICD-10-CM

## 2022-10-25 DIAGNOSIS — I10 Essential (primary) hypertension: Secondary | ICD-10-CM | POA: Diagnosis not present

## 2022-10-25 DIAGNOSIS — R7303 Prediabetes: Secondary | ICD-10-CM

## 2022-10-25 DIAGNOSIS — E669 Obesity, unspecified: Secondary | ICD-10-CM | POA: Diagnosis not present

## 2022-10-25 MED ORDER — METFORMIN HCL 500 MG PO TABS: 500.0000 mg | ORAL_TABLET | Freq: Two times a day (BID) | ORAL | 0 refills | Status: DC

## 2022-11-06 NOTE — Progress Notes (Unsigned)
Chief Complaint:   OBESITY Jillian [name] is here to discuss her progress with her obesity treatment plan along with follow-up of her obesity related diagnoses. Jillian [name] is on the Category 2 Plan and states she is following her eating plan approximately 65% of the time. Jillian [name] states she is doing 0 minutes 0 times per week.  Today's visit was #: 32 Starting weight: 223 lbs Starting date: 12/29/2019 Today's weight: 207 lbs Today's date: 10/25/2022 Total lbs lost to date: 16 Total lbs lost since last in-office visit: 2  Interim History: Jillian [name] continue to do well with weight loss. Her hunger is controlled and she is doing well with meal planning and prepping.   Subjective:   1. Essential hypertension Jillian [name]'s blood pressure is well controlled on chlorthalidone. She is getting similar blood pressure readings at home.   2. Prediabetes Jillian [name] doing well on metformin.   Assessment/Plan:   1. Essential hypertension Jillian [name] will continue her medications, and we will follow-up on her blood pressure at her next visit.   2. Prediabetes Jillian [name] will continue metformin, and we will refill metformin for 1 month.   - metFORMIN (GLUCOPHAGE) 500 MG tablet; Take 1 tablet (500 mg total) by mouth 2 (two) times daily with a meal. For blood sugar.  Dispense: 60 tablet; Refill: 0  3. BMI 33.0-33.9,adult  4. Obesity, Beginning BMI 34.22 Jillian [name] is currently in the action stage of change. As such, her goal is to continue with weight loss efforts. She has agreed to the Category 2 Plan.   Exercise goals: Patient will increase yard work.   Behavioral modification strategies: increasing lean protein intake.  Jillian [name] has agreed to follow-up with our clinic in 4 weeks. She was informed of the importance of frequent follow-up visits to maximize her success with intensive lifestyle modifications for her multiple health conditions.   Objective:   Blood pressure 119/75, pulse 89, temperature 98.2 F  (36.8 C), height 5\' 6"  (1.676 m), weight 207 lb (93.9 kg), SpO2 98 %. Body mass index is 33.41 kg/m.  Lab Results  Component Value Date   CREATININE 0.93 07/17/2022   BUN 19 07/17/2022   NA 142 07/17/2022   K 4.7 07/17/2022   CL 105 07/17/2022   CO2 23 07/17/2022   Lab Results  Component Value Date   ALT 19 07/17/2022   AST 22 07/17/2022   ALKPHOS 78 07/17/2022   BILITOT 0.3 07/17/2022   Lab Results  Component Value Date   HGBA1C 5.7 (H) 07/17/2022   HGBA1C 5.6 03/05/2022   HGBA1C 5.7 (H) 11/06/2021   HGBA1C 5.6 07/18/2021   HGBA1C 5.7 (H) 04/03/2021   Lab Results  Component Value Date   INSULIN 13.0 07/17/2022   INSULIN 7.8 03/05/2022   INSULIN 11.6 11/06/2021   INSULIN 10.9 07/18/2021   INSULIN 12.6 04/03/2021   Lab Results  Component Value Date   TSH 2.670 07/18/2021   Lab Results  Component Value Date   CHOL 203 (H) 07/17/2022   HDL 92 07/17/2022   LDLCALC 103 (H) 07/17/2022   LDLDIRECT 158.6 08/18/2013   TRIG 39 07/17/2022   CHOLHDL 2 07/11/2020   Lab Results  Component Value Date   VD25OH 56.2 07/17/2022   VD25OH 63.1 03/05/2022   VD25OH 83.1 11/06/2021   Lab Results  Component Value Date   WBC 4.3 03/05/2022   HGB 13.5 03/05/2022   HCT 41.9 03/05/2022   MCV 88 03/05/2022   PLT 236 03/05/2022   No results found for: "  IRON", "TIBC", "FERRITIN"  Attestation Statements:   Reviewed by clinician on day of visit: allergies, medications, problem list, medical history, surgical history, family history, social history, and previous encounter notes.   I, Trixie Dredge, am acting as transcriptionist for Dennard Nip, MD.  I have reviewed the above documentation for accuracy and completeness, and I agree with the above. -  Dennard Nip, MD

## 2022-11-22 ENCOUNTER — Ambulatory Visit (INDEPENDENT_AMBULATORY_CARE_PROVIDER_SITE_OTHER): Payer: Medicare Other | Admitting: Family Medicine

## 2022-11-22 ENCOUNTER — Encounter (INDEPENDENT_AMBULATORY_CARE_PROVIDER_SITE_OTHER): Payer: Self-pay | Admitting: Family Medicine

## 2022-11-22 VITALS — BP 124/74 | HR 79 | Temp 97.8°F | Ht 66.0 in | Wt 207.0 lb

## 2022-11-22 DIAGNOSIS — R7303 Prediabetes: Secondary | ICD-10-CM

## 2022-11-22 DIAGNOSIS — I10 Essential (primary) hypertension: Secondary | ICD-10-CM

## 2022-11-22 DIAGNOSIS — Z6833 Body mass index (BMI) 33.0-33.9, adult: Secondary | ICD-10-CM

## 2022-11-22 DIAGNOSIS — E669 Obesity, unspecified: Secondary | ICD-10-CM

## 2022-11-22 MED ORDER — CHLORTHALIDONE 25 MG PO TABS: 25.0000 mg | ORAL_TABLET | Freq: Every morning | ORAL | 0 refills | Status: DC

## 2022-11-22 MED ORDER — METFORMIN HCL 500 MG PO TABS: 500.0000 mg | ORAL_TABLET | Freq: Two times a day (BID) | ORAL | 0 refills | Status: DC

## 2022-11-23 ENCOUNTER — Other Ambulatory Visit: Payer: Self-pay | Admitting: Surgical

## 2022-11-23 DIAGNOSIS — M25572 Pain in left ankle and joints of left foot: Secondary | ICD-10-CM

## 2022-11-26 NOTE — Progress Notes (Unsigned)
Chief Complaint:   OBESITY Jillian [name] is here to discuss her progress with her obesity treatment plan along with follow-up of her obesity related diagnoses. Jillian [name] is on the Category 2 Plan and states she is following her eating plan approximately 65% of the time. Jillian [name] states she has been more active.    Today's visit was #: 27 Starting weight: 223 lbs Starting date: 12/29/2019 Today's weight: 207 lbs Today's date: 11/22/2022 Total lbs lost to date: 16 Total lbs lost since last in-office visit: 0  Interim History: Jillian [name] continues to work on her diet and she has increased her activity with gardening, but she is cautious due to her sciatica.  Subjective:   1. Essential hypertension Jillian [name]'s blood pressure is controlled with her medications and with her diet.  No side effects were noted.  2. Prediabetes Jillian [name] is working on her diet, and she denies nausea, vomiting, or hypoglycemia.  She has increased her exercise in her garden.  Assessment/Plan:   1. Essential hypertension Jillian [name] will continue chlorthalidone at 25 mg once daily, and we will refill for 90 days.  We will recheck labs in 1 month.  - chlorthalidone (HYGROTON) 25 MG tablet; Take 1 tablet (25 mg total) by mouth every morning.  Dispense: 90 tablet; Refill: 0  2. Prediabetes Jillian [name] will continue metformin 500 mg twice daily, and we will refill for 90 days.  We will recheck labs in 1 month.  - metFORMIN (GLUCOPHAGE) 500 MG tablet; Take 1 tablet (500 mg total) by mouth 2 (two) times daily with a meal. For blood sugar.  Dispense: 180 tablet; Refill: 0  3. BMI 33.0-33.9,adult  4. Obesity, Beginning BMI 34.22 Jillian [name] is currently in the action stage of change. As such, her goal is to continue with weight loss efforts. She has agreed to the Category 2 Plan.   Exercise goals: As is.   Behavioral modification strategies: increasing lean protein intake and meal planning and cooking strategies.  Jillian [name] has agreed  to follow-up with our clinic in 4 weeks. She was informed of the importance of frequent follow-up visits to maximize her success with intensive lifestyle modifications for her multiple health conditions.   Objective:   Blood pressure 124/74, pulse 79, temperature 97.8 F (36.6 C), height 5\' 6"  (1.676 m), weight 207 lb (93.9 kg), SpO2 99 %. Body mass index is 33.41 kg/m.  Lab Results  Component Value Date   CREATININE 0.93 07/17/2022   BUN 19 07/17/2022   NA 142 07/17/2022   K 4.7 07/17/2022   CL 105 07/17/2022   CO2 23 07/17/2022   Lab Results  Component Value Date   ALT 19 07/17/2022   AST 22 07/17/2022   ALKPHOS 78 07/17/2022   BILITOT 0.3 07/17/2022   Lab Results  Component Value Date   HGBA1C 5.7 (H) 07/17/2022   HGBA1C 5.6 03/05/2022   HGBA1C 5.7 (H) 11/06/2021   HGBA1C 5.6 07/18/2021   HGBA1C 5.7 (H) 04/03/2021   Lab Results  Component Value Date   INSULIN 13.0 07/17/2022   INSULIN 7.8 03/05/2022   INSULIN 11.6 11/06/2021   INSULIN 10.9 07/18/2021   INSULIN 12.6 04/03/2021   Lab Results  Component Value Date   TSH 2.670 07/18/2021   Lab Results  Component Value Date   CHOL 203 (H) 07/17/2022   HDL 92 07/17/2022   LDLCALC 103 (H) 07/17/2022   LDLDIRECT 158.6 08/18/2013   TRIG 39 07/17/2022   CHOLHDL 2 07/11/2020   Lab Results  Component  Value Date   VD25OH 56.2 07/17/2022   VD25OH 63.1 03/05/2022   VD25OH 83.1 11/06/2021   Lab Results  Component Value Date   WBC 4.3 03/05/2022   HGB 13.5 03/05/2022   HCT 41.9 03/05/2022   MCV 88 03/05/2022   PLT 236 03/05/2022   No results found for: "IRON", "TIBC", "FERRITIN"  Attestation Statements:   Reviewed by clinician on day of visit: allergies, medications, problem list, medical history, surgical history, family history, social history, and previous encounter notes.   I, Burt Knack, am acting as transcriptionist for Quillian Quince, MD.  I have reviewed the above documentation for accuracy and  completeness, and I agree with the above. -  Quillian Quince, MD

## 2022-12-26 ENCOUNTER — Ambulatory Visit (INDEPENDENT_AMBULATORY_CARE_PROVIDER_SITE_OTHER): Payer: Medicare Other | Admitting: Family Medicine

## 2023-01-02 ENCOUNTER — Encounter (INDEPENDENT_AMBULATORY_CARE_PROVIDER_SITE_OTHER): Payer: Self-pay | Admitting: Family Medicine

## 2023-01-02 ENCOUNTER — Ambulatory Visit (INDEPENDENT_AMBULATORY_CARE_PROVIDER_SITE_OTHER): Payer: Medicare Other | Admitting: Family Medicine

## 2023-01-02 VITALS — BP 124/77 | HR 69 | Temp 97.9°F | Ht 66.0 in | Wt 205.0 lb

## 2023-01-02 DIAGNOSIS — E876 Hypokalemia: Secondary | ICD-10-CM

## 2023-01-02 DIAGNOSIS — E669 Obesity, unspecified: Secondary | ICD-10-CM

## 2023-01-02 DIAGNOSIS — E782 Mixed hyperlipidemia: Secondary | ICD-10-CM | POA: Diagnosis not present

## 2023-01-02 DIAGNOSIS — E559 Vitamin D deficiency, unspecified: Secondary | ICD-10-CM

## 2023-01-02 DIAGNOSIS — Z6833 Body mass index (BMI) 33.0-33.9, adult: Secondary | ICD-10-CM

## 2023-01-02 DIAGNOSIS — R7303 Prediabetes: Secondary | ICD-10-CM

## 2023-01-03 LAB — CMP14+EGFR
ALT: 23 IU/L (ref 0–32)
AST: 30 IU/L (ref 0–40)
Albumin/Globulin Ratio: 1.6 (ref 1.2–2.2)
Albumin: 4.5 g/dL (ref 3.9–4.9)
Alkaline Phosphatase: 63 IU/L (ref 44–121)
BUN/Creatinine Ratio: 20 (ref 12–28)
BUN: 14 mg/dL (ref 8–27)
Bilirubin Total: 0.4 mg/dL (ref 0.0–1.2)
CO2: 24 mmol/L (ref 20–29)
Calcium: 9.4 mg/dL (ref 8.7–10.3)
Chloride: 100 mmol/L (ref 96–106)
Creatinine, Ser: 0.69 mg/dL (ref 0.57–1.00)
Globulin, Total: 2.8 g/dL (ref 1.5–4.5)
Glucose: 94 mg/dL (ref 70–99)
Potassium: 3.4 mmol/L — ABNORMAL LOW (ref 3.5–5.2)
Sodium: 141 mmol/L (ref 134–144)
Total Protein: 7.3 g/dL (ref 6.0–8.5)
eGFR: 95 mL/min/{1.73_m2} (ref >59)

## 2023-01-03 LAB — LIPID PANEL WITH LDL/HDL RATIO
Cholesterol, Total: 207 mg/dL — ABNORMAL HIGH (ref 100–199)
HDL: 95 mg/dL (ref >39)
LDL Chol Calc (NIH): 103 mg/dL — ABNORMAL HIGH (ref 0–99)
LDL/HDL Ratio: 1.1 ratio (ref 0.0–3.2)
Triglycerides: 49 mg/dL (ref 0–149)
VLDL Cholesterol Cal: 9 mg/dL (ref 5–40)

## 2023-01-03 LAB — HEMOGLOBIN A1C
Est. average glucose Bld gHb Est-mCnc: 126 mg/dL
Hgb A1c MFr Bld: 6 % — ABNORMAL HIGH (ref 4.8–5.6)

## 2023-01-03 LAB — INSULIN, RANDOM: INSULIN: 10.2 u[IU]/mL (ref 2.6–24.9)

## 2023-01-03 LAB — VITAMIN D 25 HYDROXY (VIT D DEFICIENCY, FRACTURES): Vit D, 25-Hydroxy: 56.3 ng/mL (ref 30.0–100.0)

## 2023-01-03 LAB — TSH: TSH: 2.3 u[IU]/mL (ref 0.450–4.500)

## 2023-01-04 ENCOUNTER — Encounter (INDEPENDENT_AMBULATORY_CARE_PROVIDER_SITE_OTHER): Payer: Self-pay | Admitting: Family Medicine

## 2023-01-04 ENCOUNTER — Ambulatory Visit: Payer: Medicare Other | Admitting: Surgical

## 2023-01-07 NOTE — Progress Notes (Unsigned)
Chief Complaint:   OBESITY Jillian [name] is here to discuss her progress with her obesity treatment plan along with follow-up of her obesity related diagnoses. Jillian [name] is on the Category 2 Plan and states she is following her eating plan approximately 65% of the time. Jillian [name] states she is doing 0 minutes 0 times per week.  Today's visit was #: 28 Starting weight: 223 lbs Starting date: 12/29/2019 Today's weight: 205 lbs Today's date: 01/02/2023 Total lbs lost to date: 18 Total lbs lost since last in-office visit: 2  Interim History: Jillian [name] continues to do well with her weight loss.  She is working on not skipping breakfast and she is working on increasing outdoor exercise.  She will be going on a [place] cruise.  Subjective:   1. Mixed hyperlipidemia Jillian [name] is working on decreasing cholesterol in her diet, and she is on Lipitor 20 mg.  2. Vitamin D deficiency Jillian [name] is on vitamin D, and her last level was at goal.  She denies nausea, vomiting, or muscle weakness.  3. Prediabetes Jillian [name] is doing well with decreasing simple carbohydrates and increasing her exercise.  She is due for labs.  Assessment/Plan:   1. Mixed hyperlipidemia We will check labs today.  Jillian [name] will continue Lipitor at 20 mg, and will continue with her diet.  - Lipid Panel With LDL/HDL Ratio  2. Vitamin D deficiency We will check labs today, and we will follow-up at Jillian [name]'s next visit.  - VITAMIN D 25 Hydroxy (Vit-D Deficiency, Fractures)  3. Prediabetes We will check labs today, and we will follow-up at Jillian [name]'s next visit.  She will continue with her diet and metformin.  - CMP14+EGFR - Insulin, random - Hemoglobin A1c - TSH  4. BMI 33.0-33.9,adult  5. Obesity, Beginning BMI 34.22 Jillian [name] is currently in the action stage of change. As such, her goal is to continue with weight loss efforts. She has agreed to the Category 2 Plan.   Behavioral modification strategies: increasing lean protein  intake, travel eating strategies, and celebration eating strategies.  Jillian [name] has agreed to follow-up with our clinic in 4 weeks. She was informed of the importance of frequent follow-up visits to maximize her success with intensive lifestyle modifications for her multiple health conditions.   Jillian [name] was informed we would discuss her lab results at her next visit unless there is a critical issue that needs to be addressed sooner. Jillian [name] agreed to keep her next visit at the agreed upon time to discuss these results.  Objective:   Blood pressure 124/77, pulse 69, temperature 97.9 F (36.6 C), height 5\' 6"  (1.676 m), weight 205 lb (93 kg), SpO2 98 %. Body mass index is 33.09 kg/m.  Lab Results  Component Value Date   CREATININE 0.69 01/02/2023   BUN 14 01/02/2023   NA 141 01/02/2023   K 3.4 (L) 01/02/2023   CL 100 01/02/2023   CO2 24 01/02/2023   Lab Results  Component Value Date   ALT 23 01/02/2023   AST 30 01/02/2023   ALKPHOS 63 01/02/2023   BILITOT 0.4 01/02/2023   Lab Results  Component Value Date   HGBA1C 6.0 (H) 01/02/2023   HGBA1C 5.7 (H) 07/17/2022   HGBA1C 5.6 03/05/2022   HGBA1C 5.7 (H) 11/06/2021   HGBA1C 5.6 07/18/2021   Lab Results  Component Value Date   INSULIN 10.2 01/02/2023   INSULIN 13.0 07/17/2022   INSULIN 7.8 03/05/2022   INSULIN 11.6 11/06/2021   INSULIN 10.9 07/18/2021   Lab Results  Component Value Date   TSH 2.300 01/02/2023   Lab Results  Component Value Date   CHOL 207 (H) 01/02/2023   HDL 95 01/02/2023   LDLCALC 103 (H) 01/02/2023   LDLDIRECT 158.6 08/18/2013   TRIG 49 01/02/2023   CHOLHDL 2 07/11/2020   Lab Results  Component Value Date   VD25OH 56.3 01/02/2023   VD25OH 56.2 07/17/2022   VD25OH 63.1 03/05/2022   Lab Results  Component Value Date   WBC 4.3 03/05/2022   HGB 13.5 03/05/2022   HCT 41.9 03/05/2022   MCV 88 03/05/2022   PLT 236 03/05/2022   No results found for: "IRON", "TIBC", "FERRITIN"  Attestation  Statements:   Reviewed by clinician on day of visit: allergies, medications, problem list, medical history, surgical history, family history, social history, and previous encounter notes.   I, Burt Knack, am acting as transcriptionist for Quillian Quince, MD.  I have reviewed the above documentation for accuracy and completeness, and I agree with the above. -  Quillian Quince, MD

## 2023-01-12 ENCOUNTER — Encounter (INDEPENDENT_AMBULATORY_CARE_PROVIDER_SITE_OTHER): Payer: Medicare Other

## 2023-01-12 DIAGNOSIS — Z87898 Personal history of other specified conditions: Secondary | ICD-10-CM

## 2023-01-13 DIAGNOSIS — Z87898 Personal history of other specified conditions: Secondary | ICD-10-CM

## 2023-01-15 MED ORDER — SCOPOLAMINE 1 MG/3DAYS TD PT72
1.0000 | MEDICATED_PATCH | TRANSDERMAL | 0 refills | Status: DC
Start: 2023-01-15 — End: 2023-08-12

## 2023-01-15 NOTE — Telephone Encounter (Signed)
Please see the MyChart message reply(ies) for my assessment and plan.  The patient gave consent for this Medical Advice Message and is aware that it may result in a bill to their insurance company as well as the possibility that this may result in a co-payment or deductible. They are an established patient, but are not seeking medical advice exclusively about a problem treated during an in person or video visit in the last 7 days. I did not recommend an in person or video visit within 7 days of my reply.  I spent a total of 10 minutes cumulative time within 7 days through MyChart messaging Landrie Beale K Tayanna Talford, NP  

## 2023-02-06 ENCOUNTER — Encounter (INDEPENDENT_AMBULATORY_CARE_PROVIDER_SITE_OTHER): Payer: Self-pay | Admitting: Family Medicine

## 2023-02-06 ENCOUNTER — Ambulatory Visit (INDEPENDENT_AMBULATORY_CARE_PROVIDER_SITE_OTHER): Payer: Medicare Other | Admitting: Family Medicine

## 2023-02-06 VITALS — BP 118/73 | HR 78 | Temp 98.0°F | Ht 66.0 in | Wt 204.0 lb

## 2023-02-06 DIAGNOSIS — E669 Obesity, unspecified: Secondary | ICD-10-CM

## 2023-02-06 DIAGNOSIS — Z6833 Body mass index (BMI) 33.0-33.9, adult: Secondary | ICD-10-CM

## 2023-02-06 DIAGNOSIS — Z6832 Body mass index (BMI) 32.0-32.9, adult: Secondary | ICD-10-CM

## 2023-02-06 DIAGNOSIS — R7303 Prediabetes: Secondary | ICD-10-CM

## 2023-02-06 DIAGNOSIS — E7849 Other hyperlipidemia: Secondary | ICD-10-CM

## 2023-02-06 DIAGNOSIS — E876 Hypokalemia: Secondary | ICD-10-CM

## 2023-02-06 DIAGNOSIS — I10 Essential (primary) hypertension: Secondary | ICD-10-CM

## 2023-02-06 MED ORDER — CHLORTHALIDONE 25 MG PO TABS: 25.0000 mg | ORAL_TABLET | Freq: Every morning | ORAL | 0 refills | Status: DC

## 2023-02-06 MED ORDER — METFORMIN HCL 500 MG PO TABS: 500.0000 mg | ORAL_TABLET | Freq: Two times a day (BID) | ORAL | 0 refills | Status: DC

## 2023-02-07 LAB — CMP14+EGFR
ALT: 22 IU/L (ref 0–32)
AST: 24 IU/L (ref 0–40)
Albumin: 4.5 g/dL (ref 3.9–4.9)
Alkaline Phosphatase: 65 IU/L (ref 44–121)
BUN/Creatinine Ratio: 17 (ref 12–28)
BUN: 13 mg/dL (ref 8–27)
Bilirubin Total: 0.4 mg/dL (ref 0.0–1.2)
CO2: 28 mmol/L (ref 20–29)
Calcium: 9.7 mg/dL (ref 8.7–10.3)
Chloride: 100 mmol/L (ref 96–106)
Creatinine, Ser: 0.75 mg/dL (ref 0.57–1.00)
Globulin, Total: 2.8 g/dL (ref 1.5–4.5)
Glucose: 77 mg/dL (ref 70–99)
Potassium: 3.7 mmol/L (ref 3.5–5.2)
Sodium: 142 mmol/L (ref 134–144)
Total Protein: 7.3 g/dL (ref 6.0–8.5)
eGFR: 87 mL/min/{1.73_m2} (ref >59)

## 2023-02-07 NOTE — Progress Notes (Signed)
[facility]  Office: [phone]  /  Fax: [phone]  WEIGHT SUMMARY AND BIOMETRICS  Anthropometric Measurements Height: 5\' 6"  (1.676 m) Weight: 204 lb (92.5 kg) BMI (Calculated): 32.94 Weight at Last Visit: 205 lb Weight Lost Since Last Visit: 1 lb Weight Gained Since Last Visit: 0   Body Composition  Body Fat %: 43.9 % Fat Mass (lbs): 90 lbs Muscle Mass (lbs): 109 lbs Total Body Water (lbs): 75.2 lbs Visceral Fat Rating : 13   Other Clinical Data Fasting: No Labs: No Today's Visit #: 29    Chief Complaint: OBESITY   Discussed the use of AI scribe software for clinical note transcription with the patient, who gave verbal consent to proceed.  History of Present Illness   The patient is a 67 year old female with a history of obesity, hypertension, and prediabetes. Over the past month, the patient has lost a pound by adhering to a category two diet plan approximately 60% of the time. The patient has not been engaging in formal exercise but has increased outdoor activity, primarily gardening.  The patient recently returned from vacation and noticed a significant growth in her garden, which she attributes to increased physical activity. However, she also experienced a scare with her potassium levels, which dropped from 4.7 to 3.4. The patient did not experience any symptoms associated with low potassium, such as diarrhea or vomiting, and decided not to seek immediate medical attention. She expressed a desire to recheck her potassium levels to ensure they have not worsened.  The patient's blood pressure has been well-controlled on chlorthalidone, and she has not reported any issues with metformin. However, she has noticed that her cholesterol levels have remained stubbornly high despite a diet low in fatty foods. The patient also noted an increase in her A1C from 5.7 to 6, suggesting an increase in carbohydrate intake.  Despite these health concerns, the patient has maintained a  positive attitude and has been actively engaged in managing her health. She has been enjoying her gardening activities and has been making efforts to eat healthily and stay active.          PHYSICAL EXAM:  Blood pressure 118/73, pulse 78, temperature 98 F (36.7 C), height 5\' 6"  (1.676 m), weight 204 lb (92.5 kg), SpO2 98 %. Body mass index is 32.93 kg/m.  DIAGNOSTIC DATA REVIEWED:  BMET    Component Value Date/Time   NA 142 02/06/2023 1111   K 3.7 02/06/2023 1111   CL 100 02/06/2023 1111   CO2 28 02/06/2023 1111   GLUCOSE 77 02/06/2023 1111   GLUCOSE 94 07/11/2020 0834   BUN 13 02/06/2023 1111   CREATININE 0.75 02/06/2023 1111   CALCIUM 9.7 02/06/2023 1111   GFRNONAA 73 05/12/2019 0805   GFRAA 84 05/12/2019 0805   Lab Results  Component Value Date   HGBA1C 6.0 (H) 01/02/2023   HGBA1C 5.6 06/12/2018   Lab Results  Component Value Date   INSULIN 10.2 01/02/2023   INSULIN 8.5 06/12/2018   Lab Results  Component Value Date   TSH 2.300 01/02/2023   CBC    Component Value Date/Time   WBC 4.3 03/05/2022 1017   WBC 4.3 07/11/2020 0834   RBC 4.78 03/05/2022 1017   RBC 4.76 07/11/2020 0834   HGB 13.5 03/05/2022 1017   HCT 41.9 03/05/2022 1017   PLT 236 03/05/2022 1017   MCV 88 03/05/2022 1017   MCH 28.2 03/05/2022 1017   MCHC 32.2 03/05/2022 1017   MCHC 33.1 07/11/2020 0834  RDW 12.8 03/05/2022 1017   Iron Studies No results found for: "IRON", "TIBC", "FERRITIN", "IRONPCTSAT" Lipid Panel     Component Value Date/Time   CHOL 207 (H) 01/02/2023 1330   TRIG 49 01/02/2023 1330   HDL 95 01/02/2023 1330   CHOLHDL 2 07/11/2020 0834   VLDL 9.2 07/11/2020 0834   LDLCALC 103 (H) 01/02/2023 1330   LDLDIRECT 158.6 08/18/2013 0803   Hepatic Function Panel     Component Value Date/Time   PROT 7.3 02/06/2023 1111   ALBUMIN 4.5 02/06/2023 1111   AST 24 02/06/2023 1111   ALT 22 02/06/2023 1111   ALKPHOS 65 02/06/2023 1111   BILITOT 0.4 02/06/2023 1111   BILIDIR  0.0 08/10/2019 1608      Component Value Date/Time   TSH 2.300 01/02/2023 1330   Nutritional Lab Results  Component Value Date   VD25OH 56.3 01/02/2023   VD25OH 56.2 07/17/2022   VD25OH 63.1 03/05/2022     Assessment and Plan    Obesity: One pound weight loss over the past month with adherence to category two plan 60% of the time. Increased outdoor activity noted. -Continue current diet and increase adherence to category two plan. -Increase physical activity.  Hypertension: Well controlled on current medication. -Continue chlorthalidone. -Refill prescription for 90 days.  Prediabetes: A1C increased from 5.7 to 6.0, indicating increased carbohydrate intake. -Continue metformin. -Refill prescription for 90 days. -Reduce carbohydrate intake, including healthy carbohydrates. -Recheck A1C in 4 weeks.  Hypercholesterolemia: Persistent despite dietary changes. Likely due to genetic predisposition and age-related increase in LDL production by the liver. -Consider lipid-lowering therapy if lifestyle modifications do not result in improvement. -Recheck cholesterol levels in 4 weeks.  General Health Maintenance: -Continue gardening and outdoor activities for physical fitness and mental health. -Recheck potassium levels today due to recent drop.         I have personally spent 40 minutes total time today in preparation, patient care, and documentation for this visit, including the following: review of clinical lab tests; review of medical tests/procedures/services.    She was informed of the importance of frequent follow up visits to maximize her success with intensive lifestyle modifications for her multiple health conditions.    Quillian Quince, MD

## 2023-03-12 ENCOUNTER — Ambulatory Visit (INDEPENDENT_AMBULATORY_CARE_PROVIDER_SITE_OTHER): Payer: Medicare Other | Admitting: Family Medicine

## 2023-03-13 ENCOUNTER — Encounter (INDEPENDENT_AMBULATORY_CARE_PROVIDER_SITE_OTHER): Payer: Self-pay | Admitting: Family Medicine

## 2023-03-13 ENCOUNTER — Telehealth (INDEPENDENT_AMBULATORY_CARE_PROVIDER_SITE_OTHER): Payer: Medicare Other | Admitting: Family Medicine

## 2023-03-13 VITALS — BP 113/72 | HR 76 | Temp 98.5°F | Ht 66.0 in | Wt 206.0 lb

## 2023-03-13 DIAGNOSIS — E785 Hyperlipidemia, unspecified: Secondary | ICD-10-CM | POA: Diagnosis not present

## 2023-03-13 DIAGNOSIS — E782 Mixed hyperlipidemia: Secondary | ICD-10-CM

## 2023-03-13 DIAGNOSIS — Z6833 Body mass index (BMI) 33.0-33.9, adult: Secondary | ICD-10-CM | POA: Diagnosis not present

## 2023-03-13 DIAGNOSIS — R7303 Prediabetes: Secondary | ICD-10-CM | POA: Diagnosis not present

## 2023-03-13 DIAGNOSIS — E669 Obesity, unspecified: Secondary | ICD-10-CM

## 2023-03-13 NOTE — Progress Notes (Addendum)
TeleHealth Visit:  This visit was completed with telemedicine (audio/video) technology. Jillian [name] has verbally consented to this TeleHealth visit. The patient is located at home, the provider is located at home. The participants in this visit include the listed provider and patient. The visit was conducted today via MyChart video.  OBESITY Jillian [name] is here to discuss her progress with her obesity treatment plan along with follow-up of her obesity related diagnoses.   Today's visit was # 30 Starting weight: 223 lbs Starting date: 12/29/19 Weight in office today: 206 lbs Total weight loss: 17 lbs  Weight change since last visit: +2 lbs  Nutrition Plan: the Category 2 plan  Current exercise:  gardening  Interim History:  She was confused about the day of her appointment and missed her appointment yesterday. She has a garden and has been eating more vegetables and less meat. She also says she has been eating more carbohydrates She feels better with more activity and is enjoying working in her garden. She does not do any formal exercise.  Assessment/Plan:  1. Prediabetes Last A1c was 6.0  Medication(s): Metformin 500 mg twice daily with meals.  Denies side effects.  Lab Results  Component Value Date   HGBA1C 6.0 (H) 01/02/2023   HGBA1C 5.7 (H) 07/17/2022   HGBA1C 5.6 03/05/2022   HGBA1C 5.7 (H) 11/06/2021   HGBA1C 5.6 07/18/2021   Lab Results  Component Value Date   INSULIN 10.2 01/02/2023   INSULIN 13.0 07/17/2022   INSULIN 7.8 03/05/2022   INSULIN 11.6 11/06/2021   INSULIN 10.9 07/18/2021    Plan: Continue Metformin 500 mg twice daily with meals  2. Hyperlipidemia LDL slightly elevated at 103, HDL normal at 95, triglycerides normal at 49. Medication(s): Lipitor 20 mg daily.  Did not report myalgias. Cardiovascular risk factors: advanced age (older than 19 for men, 63 for women), dyslipidemia, hypertension, obesity (BMI >= 30 kg/m2), and sedentary  lifestyle 10-year ASCVD risk score is 7.6%. Lab Results  Component Value Date   CHOL 207 (H) 01/02/2023   HDL 95 01/02/2023   LDLCALC 103 (H) 01/02/2023   LDLDIRECT 158.6 08/18/2013   TRIG 49 01/02/2023   CHOLHDL 2 07/11/2020   CHOLHDL 2 08/10/2019   CHOLHDL 3 07/08/2019   Lab Results  Component Value Date   ALT 22 02/06/2023   AST 24 02/06/2023   ALKPHOS 65 02/06/2023   BILITOT 0.4 02/06/2023   The 10-year ASCVD risk score (Arnett DK, et al., 2019) is: 7.6%   Values used to calculate the score:     Age: 78 years     Sex: Female     Is Non-Hispanic African American: Yes     Diabetic: No     Tobacco smoker: No     Systolic Blood Pressure: 113 mmHg     Is BP treated: Yes     HDL Cholesterol: 95 mg/dL     Total Cholesterol: 207 mg/dL  Plan: Continue statin. If encouraged her to be active in her garden at least 3 days/week. Continue to eat plenty of fiber.  3. Generalized Obesity: Current BMI 33 Yicel is currently in the action stage of change. As such, her goal is to continue with weight loss efforts.  She has agreed to keeping a food journal with goal of 1300-1400 calories and 90 grams of protein daily.  She will begin journaling using paper and pencil and using the lose it app as a resource. Handout sent via MyChart-protein content of food.  Exercise goals: Work  in garden for least 30 minutes 3 times per week.  Behavioral modification strategies: increasing lean protein intake, decreasing simple carbohydrates , and keep a strict food journal.  [place] has agreed to follow-up with our clinic in 4 weeks.  No orders of the defined types were placed in this encounter.   There are no discontinued medications.   No orders of the defined types were placed in this encounter.     Objective:   VITALS: Per patient if applicable, see vitals. GENERAL: Alert and in no acute distress. CARDIOPULMONARY: No increased WOB. Speaking in clear sentences.  PSYCH: Pleasant and  cooperative. Speech normal rate and rhythm. Affect is appropriate. Insight and judgement are appropriate. Attention is focused, linear, and appropriate.  NEURO: Oriented as arrived to appointment on time with no prompting.   Attestation Statements:   Reviewed by clinician on day of visit: allergies, medications, problem list, medical history, surgical history, family history, social history, and previous encounter notes.  Time spent on visit including the items listed below was 30 minutes.  -preparing to see the patient (e.g., review of tests, history, previous notes) -obtaining and/or reviewing separately obtained history -counseling and educating the patient/family/caregiver -documenting clinical information in the electronic or other health record -ordering medications, tests, or procedures -independently interpreting results and communicating results to the patient/ family/caregiver -referring and communicating with other health care professionals  -care coordination   This was prepared with the assistance of Engineer, civil (consulting).  Occasional wrong-word or sound-a-like substitutions may have occurred due to the inherent limitations of voice recognition software.

## 2023-04-10 ENCOUNTER — Encounter (INDEPENDENT_AMBULATORY_CARE_PROVIDER_SITE_OTHER): Payer: Self-pay | Admitting: Family Medicine

## 2023-04-10 ENCOUNTER — Ambulatory Visit (INDEPENDENT_AMBULATORY_CARE_PROVIDER_SITE_OTHER): Payer: Medicare Other | Admitting: Family Medicine

## 2023-04-10 VITALS — BP 122/78 | HR 85 | Temp 98.2°F | Ht 66.0 in | Wt 204.0 lb

## 2023-04-10 DIAGNOSIS — Z6832 Body mass index (BMI) 32.0-32.9, adult: Secondary | ICD-10-CM

## 2023-04-10 DIAGNOSIS — G4709 Other insomnia: Secondary | ICD-10-CM | POA: Insufficient documentation

## 2023-04-10 DIAGNOSIS — E669 Obesity, unspecified: Secondary | ICD-10-CM | POA: Diagnosis not present

## 2023-04-10 DIAGNOSIS — F3289 Other specified depressive episodes: Secondary | ICD-10-CM | POA: Diagnosis not present

## 2023-04-10 MED ORDER — BUPROPION HCL ER (SR) 200 MG PO TB12
200.0000 mg | ORAL_TABLET | Freq: Every morning | ORAL | 0 refills | Status: DC
Start: 2023-04-10 — End: 2023-05-15

## 2023-04-11 NOTE — Progress Notes (Signed)
Chief Complaint:   OBESITY Jillian [name] is here to discuss her progress with her obesity treatment plan along with follow-up of her obesity related diagnoses. Jillian [name] is on keeping a food journal and adhering to recommended goals of 1300-1400 calories and 90 grams of protein and states she is following her eating plan approximately 40% of the time. Jillian [name] states she is doing 0 minutes 0 times per week.  Today's visit was #: 31 Starting weight: 223 lbs Starting date: 12/29/2019 Today's weight: 204 lbs Today's date: 04/10/2023 Total lbs lost to date: 19 Total lbs lost since last in-office visit: 2  Interim History: Patient has done well with her weight loss.  She has been off her normal routine, but she has still been mindful of her food choices.  Subjective:   1. Other insomnia Patient notes she is struggling with sleep, worse with menopause.  2. Emotional Eating Behavior Patient recently restarted Wellbutrin, especially as her life has been more hectic.  Assessment/Plan:   1. Other insomnia Patient is okay to take calm melatonin and magnesium supplements, and we will continue to follow.  2. Emotional Eating Behavior We will refill Wellbutrin SR 200 mg every morning for 90 days.  - buPROPion (WELLBUTRIN SR) 200 MG 12 hr tablet; Take 1 tablet (200 mg total) by mouth every morning.  Dispense: 90 tablet; Refill: 0  3. BMI 32.0-32.9,adult  4. Obesity, Beginning BMI 34.22 Jillian [name] is currently in the action stage of change. As such, her goal is to continue with weight loss efforts. She has agreed to the Category 2 Plan or keeping a food journal and adhering to recommended goals of 1300-1400 calories and 90+ grams of protein daily.   Exercise goals: All adults should avoid inactivity. Some physical activity is better than none, and adults who participate in any amount of physical activity gain some health benefits.  Behavioral modification strategies: increasing lean protein intake  and meal planning and cooking strategies.  Jillian [name] has agreed to follow-up with our clinic in 4 weeks. She was informed of the importance of frequent follow-up visits to maximize her success with intensive lifestyle modifications for her multiple health conditions.   Objective:   Blood pressure 122/78, pulse 85, temperature 98.2 F (36.8 C), height 5\' 6"  (1.676 m), weight 204 lb (92.5 kg), SpO2 95%. Body mass index is 32.93 kg/m.  Lab Results  Component Value Date   CREATININE 0.75 02/06/2023   BUN 13 02/06/2023   NA 142 02/06/2023   K 3.7 02/06/2023   CL 100 02/06/2023   CO2 28 02/06/2023   Lab Results  Component Value Date   ALT 22 02/06/2023   AST 24 02/06/2023   ALKPHOS 65 02/06/2023   BILITOT 0.4 02/06/2023   Lab Results  Component Value Date   HGBA1C 6.0 (H) 01/02/2023   HGBA1C 5.7 (H) 07/17/2022   HGBA1C 5.6 03/05/2022   HGBA1C 5.7 (H) 11/06/2021   HGBA1C 5.6 07/18/2021   Lab Results  Component Value Date   INSULIN 10.2 01/02/2023   INSULIN 13.0 07/17/2022   INSULIN 7.8 03/05/2022   INSULIN 11.6 11/06/2021   INSULIN 10.9 07/18/2021   Lab Results  Component Value Date   TSH 2.300 01/02/2023   Lab Results  Component Value Date   CHOL 207 (H) 01/02/2023   HDL 95 01/02/2023   LDLCALC 103 (H) 01/02/2023   LDLDIRECT 158.6 08/18/2013   TRIG 49 01/02/2023   CHOLHDL 2 07/11/2020   Lab Results  Component Value Date  VD25OH 56.3 01/02/2023   VD25OH 56.2 07/17/2022   VD25OH 63.1 03/05/2022   Lab Results  Component Value Date   WBC 4.3 03/05/2022   HGB 13.5 03/05/2022   HCT 41.9 03/05/2022   MCV 88 03/05/2022   PLT 236 03/05/2022   No results found for: "IRON", "TIBC", "FERRITIN"  Attestation Statements:   Reviewed by clinician on day of visit: allergies, medications, problem list, medical history, surgical history, family history, social history, and previous encounter notes.   I, Burt Knack, am acting as transcriptionist for Quillian Quince,  MD.  I have reviewed the above documentation for accuracy and completeness, and I agree with the above. -  Quillian Quince, MD

## 2023-05-15 ENCOUNTER — Encounter (INDEPENDENT_AMBULATORY_CARE_PROVIDER_SITE_OTHER): Payer: Self-pay | Admitting: Family Medicine

## 2023-05-15 ENCOUNTER — Ambulatory Visit (INDEPENDENT_AMBULATORY_CARE_PROVIDER_SITE_OTHER): Payer: Medicare Other | Admitting: Family Medicine

## 2023-05-15 VITALS — BP 117/75 | HR 78 | Temp 98.4°F | Ht 66.0 in | Wt 208.0 lb

## 2023-05-15 DIAGNOSIS — E669 Obesity, unspecified: Secondary | ICD-10-CM

## 2023-05-15 DIAGNOSIS — F5089 Other specified eating disorder: Secondary | ICD-10-CM

## 2023-05-15 DIAGNOSIS — R7303 Prediabetes: Secondary | ICD-10-CM | POA: Diagnosis not present

## 2023-05-15 DIAGNOSIS — I10 Essential (primary) hypertension: Secondary | ICD-10-CM | POA: Diagnosis not present

## 2023-05-15 DIAGNOSIS — F3289 Other specified depressive episodes: Secondary | ICD-10-CM

## 2023-05-15 DIAGNOSIS — Z6833 Body mass index (BMI) 33.0-33.9, adult: Secondary | ICD-10-CM

## 2023-05-15 MED ORDER — METFORMIN HCL 500 MG PO TABS
500.0000 mg | ORAL_TABLET | Freq: Two times a day (BID) | ORAL | 0 refills | Status: DC
Start: 2023-05-15 — End: 2023-07-10

## 2023-05-15 MED ORDER — BUPROPION HCL ER (SR) 200 MG PO TB12
200.0000 mg | ORAL_TABLET | Freq: Every morning | ORAL | 0 refills | Status: DC
Start: 2023-05-15 — End: 2023-07-10

## 2023-05-15 MED ORDER — CHLORTHALIDONE 25 MG PO TABS
25.0000 mg | ORAL_TABLET | Freq: Every morning | ORAL | 0 refills | Status: DC
Start: 2023-05-15 — End: 2023-07-10

## 2023-05-15 NOTE — Progress Notes (Unsigned)
Chief Complaint:   OBESITY Jillian [name] is here to discuss her progress with her obesity treatment plan along with follow-up of her obesity related diagnoses. Jillian [name] is on the Category 2 Plan and keeping a food journal and adhering to recommended goals of 1300-1400 calories and 90+ grams of protein and states she is following her eating plan approximately 65% of the time. Jillian [name] states she is doing 0 minutes 0 times per week.  Today's visit was #: 32 Starting weight: 223 lbs Starting date: 12/29/2019 Today's weight: 208 lbs Today's date: 05/15/2023 Total lbs lost to date: 15 Total lbs lost since last in-office visit: 0  Interim History: Patient has not been able to concentrate on weight loss as she is dealing with worsening back pain.  She is working on getting back on track as her back heals.  Subjective:   1. Prediabetes Patient is struggling more with her diet recently.  2. Essential hypertension Patient's blood pressure is well-controlled on her medications.  She denies chest pain, headache, or dizziness.  3. Emotional Eating Behavior Patient notes some increase in emotional eating behavior.  No side effects were mentioned on Wellbutrin.  Assessment/Plan:   1. Prediabetes Patient will continue metformin and will continue with her diet, and we will refill metformin 500 mg twice daily for 90 days.  - metFORMIN (GLUCOPHAGE) 500 MG tablet; Take 1 tablet (500 mg total) by mouth 2 (two) times daily with a meal. For blood sugar.  Dispense: 180 tablet; Refill: 0  2. Essential hypertension Patient will continue chlorthalidone and we will continue with her diet, and we will refill chlorthalidone 25 mg every morning for 90 days.  - chlorthalidone (HYGROTON) 25 MG tablet; Take 1 tablet (25 mg total) by mouth every morning.  Dispense: 90 tablet; Refill: 0  3. Emotional Eating Behavior Patient will continue Wellbutrin SR 200 mg every morning, and we will refill for 90 days.  -  buPROPion (WELLBUTRIN SR) 200 MG 12 hr tablet; Take 1 tablet (200 mg total) by mouth every morning.  Dispense: 90 tablet; Refill: 0  4. BMI 33.0-33.9,adult  5. Obesity, Beginning BMI 34.22 Jillian [name] is currently in the action stage of change. As such, her goal is to continue with weight loss efforts. She has agreed to the Category 2 Plan.   Behavioral modification strategies: increasing lean protein intake, no skipping meals, and dealing with family or coworker sabotage.  Jillian [name] has agreed to follow-up with our clinic in 4 weeks. She was informed of the importance of frequent follow-up visits to maximize her success with intensive lifestyle modifications for her multiple health conditions.   Objective:   Blood pressure 117/75, pulse 78, temperature 98.4 F (36.9 C), height 5\' 6"  (1.676 m), weight 208 lb (94.3 kg), SpO2 100%. Body mass index is 33.57 kg/m.  Lab Results  Component Value Date   CREATININE 0.75 02/06/2023   BUN 13 02/06/2023   NA 142 02/06/2023   K 3.7 02/06/2023   CL 100 02/06/2023   CO2 28 02/06/2023   Lab Results  Component Value Date   ALT 22 02/06/2023   AST 24 02/06/2023   ALKPHOS 65 02/06/2023   BILITOT 0.4 02/06/2023   Lab Results  Component Value Date   HGBA1C 6.0 (H) 01/02/2023   HGBA1C 5.7 (H) 07/17/2022   HGBA1C 5.6 03/05/2022   HGBA1C 5.7 (H) 11/06/2021   HGBA1C 5.6 07/18/2021   Lab Results  Component Value Date   INSULIN 10.2 01/02/2023   INSULIN 13.0  07/17/2022   INSULIN 7.8 03/05/2022   INSULIN 11.6 11/06/2021   INSULIN 10.9 07/18/2021   Lab Results  Component Value Date   TSH 2.300 01/02/2023   Lab Results  Component Value Date   CHOL 207 (H) 01/02/2023   HDL 95 01/02/2023   LDLCALC 103 (H) 01/02/2023   LDLDIRECT 158.6 08/18/2013   TRIG 49 01/02/2023   CHOLHDL 2 07/11/2020   Lab Results  Component Value Date   VD25OH 56.3 01/02/2023   VD25OH 56.2 07/17/2022   VD25OH 63.1 03/05/2022   Lab Results  Component Value Date    WBC 4.3 03/05/2022   HGB 13.5 03/05/2022   HCT 41.9 03/05/2022   MCV 88 03/05/2022   PLT 236 03/05/2022   No results found for: "IRON", "TIBC", "FERRITIN"  Attestation Statements:   Reviewed by clinician on day of visit: allergies, medications, problem list, medical history, surgical history, family history, social history, and previous encounter notes.   I, Burt Knack, am acting as transcriptionist for Quillian Quince, MD.  I have reviewed the above documentation for accuracy and completeness, and I agree with the above. -  Quillian Quince, MD

## 2023-05-29 ENCOUNTER — Encounter: Payer: Self-pay | Admitting: Gastroenterology

## 2023-06-07 ENCOUNTER — Other Ambulatory Visit: Payer: Self-pay | Admitting: Surgical

## 2023-06-07 DIAGNOSIS — M25572 Pain in left ankle and joints of left foot: Secondary | ICD-10-CM

## 2023-06-07 MED ORDER — MELOXICAM 15 MG PO TABS
15.0000 mg | ORAL_TABLET | Freq: Every day | ORAL | 0 refills | Status: DC
Start: 2023-06-07 — End: 2024-02-06

## 2023-06-12 ENCOUNTER — Ambulatory Visit (INDEPENDENT_AMBULATORY_CARE_PROVIDER_SITE_OTHER): Payer: Medicare Other | Admitting: Family Medicine

## 2023-07-04 ENCOUNTER — Telehealth: Payer: Self-pay | Admitting: *Deleted

## 2023-07-04 ENCOUNTER — Ambulatory Visit (AMBULATORY_SURGERY_CENTER): Payer: Medicare Other | Admitting: *Deleted

## 2023-07-04 VITALS — Ht 66.0 in | Wt 210.0 lb

## 2023-07-04 DIAGNOSIS — Z860100 Personal history of colon polyps, unspecified: Secondary | ICD-10-CM

## 2023-07-04 MED ORDER — NA SULFATE-K SULFATE-MG SULF 17.5-3.13-1.6 GM/177ML PO SOLN
1.0000 | Freq: Once | ORAL | 0 refills | Status: AC
Start: 2023-07-04 — End: 2023-07-04

## 2023-07-04 NOTE — Telephone Encounter (Signed)
Attempt to reach pt for pre-visit. LM with call back #.   Will attempt other number in profile  Second attempt to reach pt for pre-vist unsuccessful. LM with facility # for pt to call back. Instructed pt to call # given by end of the day and reschedule the pre-visit  with RN or the scheduled procedure will be canceled.

## 2023-07-04 NOTE — Progress Notes (Signed)
Pt's name and DOB verified at the beginning of the pre-visit wit 2 identifiers  Pt denies any difficulty with ambulating,sitting, laying down or rolling side to side  Pt has issues with ambulation   Pt has no issues moving head neck or swallowing  No egg or soy allergy known to patient   No issues known to pt with past sedation with any surgeries or procedures  Pt denies having issues being intubated  No FH of Malignant Hyperthermia  Pt is not on diet pills or shots  Pt is not on home 02   Pt is not on blood thinners   Pt has frequent issues with constipation RN instructed pt to use Miralax per bottles instructions a week before prep days. Pt states they will  Pt is not on dialysis  Pt denise any abnormal heart rhythms   Pt denies any upcoming cardiac testing  Pt encouraged to use to use Singlecare or Goodrx to reduce cost   Patient's chart reviewed by Cathlyn Parsons CNRA prior to pre-visit and patient appropriate for the [facility].  Pre-visit completed and red dot placed by patient's name on their procedure day (on provider's schedule).  .  Visit by phone Pt states weight is 210 lb  Instructed pt why it is important to and  to call if they have any changes in health 210or new medications. Directed them to the # given and on instructions.     Instructions reviewed. Pt given both [facility] main # and MD on call # prior to instructions.  Pt states understanding. Instructed to review again prior to procedure. Pt states they will.   Instructions sent by mail with coupon and by My Chart

## 2023-07-10 ENCOUNTER — Ambulatory Visit (INDEPENDENT_AMBULATORY_CARE_PROVIDER_SITE_OTHER): Payer: Medicare Other | Admitting: Family Medicine

## 2023-07-10 ENCOUNTER — Encounter (INDEPENDENT_AMBULATORY_CARE_PROVIDER_SITE_OTHER): Payer: Self-pay | Admitting: Family Medicine

## 2023-07-10 VITALS — BP 136/82 | HR 82 | Temp 98.0°F | Ht 66.0 in | Wt 204.0 lb

## 2023-07-10 DIAGNOSIS — Z6832 Body mass index (BMI) 32.0-32.9, adult: Secondary | ICD-10-CM | POA: Diagnosis not present

## 2023-07-10 DIAGNOSIS — E669 Obesity, unspecified: Secondary | ICD-10-CM

## 2023-07-10 DIAGNOSIS — I10 Essential (primary) hypertension: Secondary | ICD-10-CM

## 2023-07-10 DIAGNOSIS — F5089 Other specified eating disorder: Secondary | ICD-10-CM

## 2023-07-10 DIAGNOSIS — R7303 Prediabetes: Secondary | ICD-10-CM

## 2023-07-10 DIAGNOSIS — F3289 Other specified depressive episodes: Secondary | ICD-10-CM

## 2023-07-10 MED ORDER — BUPROPION HCL ER (SR) 200 MG PO TB12
200.0000 mg | ORAL_TABLET | Freq: Every morning | ORAL | 0 refills | Status: DC
Start: 2023-07-10 — End: 2023-09-04

## 2023-07-10 MED ORDER — METFORMIN HCL 500 MG PO TABS
500.0000 mg | ORAL_TABLET | Freq: Two times a day (BID) | ORAL | 0 refills | Status: DC
Start: 2023-07-10 — End: 2023-09-04

## 2023-07-10 MED ORDER — CHLORTHALIDONE 25 MG PO TABS
25.0000 mg | ORAL_TABLET | Freq: Every morning | ORAL | 0 refills | Status: DC
Start: 2023-07-10 — End: 2023-09-04

## 2023-07-10 NOTE — Progress Notes (Signed)
[facility]  Office: [phone]  /  Fax: [phone]  WEIGHT SUMMARY AND BIOMETRICS  Anthropometric Measurements Height: 5\' 6"  (1.676 m) Weight: 204 lb (92.5 kg) BMI (Calculated): 32.94 Weight at Last Visit: 208 lb Weight Lost Since Last Visit: 4 lb Weight Gained Since Last Visit: 0 Starting Weight: 223 lb Total Weight Loss (lbs): 19 lb (8.618 kg) Peak Weight: 230 lb   Body Composition  Body Fat %: 43.1 % Fat Mass (lbs): 88 lbs Muscle Mass (lbs): 110.2 lbs Total Body Water (lbs): 74 lbs Visceral Fat Rating : 13   Other Clinical Data Fasting: No Labs: No Today's Visit #: 21 Starting Date: 12/28/20    Chief Complaint: OBESITY   History of Present Illness   The patient, currently on metformin for hyperglycemia and managing obesity, reports a weight loss of four pounds over the last two months. She has been adhering to a category two diet plan about half of the time but is not currently engaging in any exercise.  The patient is dealing with significant emotional stress due to a family member's critical health condition. Her sister, who underwent a gastric bypass surgery in the 1980s, is currently hospitalized with complications from the procedure. The sister's condition has deteriorated following a gallbladder issue and a breast cancer diagnosis. The patient has been traveling frequently to visit her sister in the hospital, which has disrupted her routine and made healthier eating more difficult.  Despite these stressors, the patient reports that she has not been engaging in emotional eating excessively. She has been making an effort to consume nutritious food, particularly focusing on protein intake, even while at the hospital. She acknowledges the importance of maintaining her own health during this challenging time.  The patient also has a history of hypertension, managed with chlorthalidone, and prediabetes, managed with metformin. She is also on bupropion for emotional eating  and stress management. The patient's blood pressure was slightly elevated at this visit compared to her usual readings, but it was not deemed problematic.  The patient is due for a physical exam after Christmas and has scheduled a colonoscopy for early December. She has recently completed dental and dermatological check-ups and has a mammogram scheduled for the following week.          PHYSICAL EXAM:  Blood pressure 136/82, pulse 82, temperature 98 F (36.7 C), height 5\' 6"  (1.676 m), weight 204 lb (92.5 kg), SpO2 100%. Body mass index is 32.93 kg/m.  DIAGNOSTIC DATA REVIEWED:  BMET    Component Value Date/Time   NA 142 02/06/2023 1111   K 3.7 02/06/2023 1111   CL 100 02/06/2023 1111   CO2 28 02/06/2023 1111   GLUCOSE 77 02/06/2023 1111   GLUCOSE 94 07/11/2020 0834   BUN 13 02/06/2023 1111   CREATININE 0.75 02/06/2023 1111   CALCIUM 9.7 02/06/2023 1111   GFRNONAA 73 05/12/2019 0805   GFRAA 84 05/12/2019 0805   Lab Results  Component Value Date   HGBA1C 6.0 (H) 01/02/2023   HGBA1C 5.6 06/12/2018   Lab Results  Component Value Date   INSULIN 10.2 01/02/2023   INSULIN 8.5 06/12/2018   Lab Results  Component Value Date   TSH 2.300 01/02/2023   CBC    Component Value Date/Time   WBC 4.3 03/05/2022 1017   WBC 4.3 07/11/2020 0834   RBC 4.78 03/05/2022 1017   RBC 4.76 07/11/2020 0834   HGB 13.5 03/05/2022 1017   HCT 41.9 03/05/2022 1017   PLT 236 03/05/2022 1017  MCV 88 03/05/2022 1017   MCH 28.2 03/05/2022 1017   MCHC 32.2 03/05/2022 1017   MCHC 33.1 07/11/2020 0834   RDW 12.8 03/05/2022 1017   Iron Studies No results found for: "IRON", "TIBC", "FERRITIN", "IRONPCTSAT" Lipid Panel     Component Value Date/Time   CHOL 207 (H) 01/02/2023 1330   TRIG 49 01/02/2023 1330   HDL 95 01/02/2023 1330   CHOLHDL 2 07/11/2020 0834   VLDL 9.2 07/11/2020 0834   LDLCALC 103 (H) 01/02/2023 1330   LDLDIRECT 158.6 08/18/2013 0803   Hepatic Function Panel      Component Value Date/Time   PROT 7.3 02/06/2023 1111   ALBUMIN 4.5 02/06/2023 1111   AST 24 02/06/2023 1111   ALT 22 02/06/2023 1111   ALKPHOS 65 02/06/2023 1111   BILITOT 0.4 02/06/2023 1111   BILIDIR 0.0 08/10/2019 1608      Component Value Date/Time   TSH 2.300 01/02/2023 1330   Nutritional Lab Results  Component Value Date   VD25OH 56.3 01/02/2023   VD25OH 56.2 07/17/2022   VD25OH 63.1 03/05/2022     Assessment and Plan    Obesity Following the category two plan 50% of the time, resulting in a 4-pound weight loss over two months. Not currently exercising. Emphasized protein intake due to sister's malnutrition complications. Discussed adherence to the plan and initiating exercise to improve outcomes. - Encourage adherence to the category two plan - Recommend starting an exercise regimen - Emphasize the importance of protein intake  Emotional Eating Significant stress due to family issues, particularly sister's health. Bupropion is helping manage emotional eating and stress. Discussed stress management and its impact on overall health. - Continue bupropion -stress management strategies discussed in depth today  Hyperglycemia On metformin twice daily. Blood sugar levels are being monitored. Discussed maintaining blood sugar control to prevent complications. - Continue metformin twice daily - Monitor blood sugar levels  Hypertension Blood pressure slightly elevated but not problematic. On chlorthalidone. Discussed blood pressure control to prevent cardiovascular complications. - Refill chlorthalidone -Continue diet, exercise and weight loss to treat  General Health Maintenance Scheduled for a mammogram next week, a physical exam after Christmas, and a colonoscopy on December 11. Completed dental and dermatological check-ups. Discussed the importance of routine screenings and preventive health measures. - Schedule fasting labs - Recommend follow-up with Florentina Addison (NP) or  Shawn (PA) if primary doctor is unavailable  Follow-up - Schedule next appointment before Christmas if possible - If primary doctor is unavailable, follow up with Florentina Addison (NP) or Shawn (PA).       She was informed of the importance of frequent follow up visits to maximize her success with intensive lifestyle modifications for her multiple health conditions.    Quillian Quince, MD

## 2023-07-16 LAB — HM MAMMOGRAPHY

## 2023-07-17 ENCOUNTER — Encounter: Payer: Self-pay | Admitting: Primary Care

## 2023-07-31 ENCOUNTER — Ambulatory Visit: Payer: Medicare Other | Admitting: Gastroenterology

## 2023-07-31 ENCOUNTER — Encounter: Payer: Self-pay | Admitting: Gastroenterology

## 2023-07-31 VITALS — BP 118/77 | HR 82 | Temp 97.3°F | Resp 19 | Ht 66.0 in | Wt 210.0 lb

## 2023-07-31 DIAGNOSIS — K640 First degree hemorrhoids: Secondary | ICD-10-CM | POA: Diagnosis not present

## 2023-07-31 DIAGNOSIS — K644 Residual hemorrhoidal skin tags: Secondary | ICD-10-CM

## 2023-07-31 DIAGNOSIS — Z1211 Encounter for screening for malignant neoplasm of colon: Secondary | ICD-10-CM | POA: Diagnosis not present

## 2023-07-31 DIAGNOSIS — K635 Polyp of colon: Secondary | ICD-10-CM | POA: Diagnosis not present

## 2023-07-31 DIAGNOSIS — K5520 Angiodysplasia of colon without hemorrhage: Secondary | ICD-10-CM

## 2023-07-31 DIAGNOSIS — Z800 Family history of malignant neoplasm of digestive organs: Secondary | ICD-10-CM

## 2023-07-31 DIAGNOSIS — D123 Benign neoplasm of transverse colon: Secondary | ICD-10-CM

## 2023-07-31 DIAGNOSIS — Z860101 Personal history of adenomatous and serrated colon polyps: Secondary | ICD-10-CM

## 2023-07-31 MED ORDER — SODIUM CHLORIDE 0.9 % IV SOLN: 500.0000 mL | Freq: Once | INTRAVENOUS | Status: DC

## 2023-07-31 NOTE — Op Note (Addendum)
[facility] Patient Name: Jillian [name] Procedure Date: 07/31/2023 8:28 AM MRN: [id number] Endoscopist: Meryl Dare , MD, [phone] Age: 67 Referring MD:  Date of Birth: [birth year] Gender: Female Account #: [id number] Procedure:                Colonoscopy Indications:              Surveillance: Personal history of adenomatous                            polyps on last colonoscopy 5 years ago, Family                            history of colon cancer, 1st-degree relative Medicines:                Monitored Anesthesia Care Procedure:                Pre-Anesthesia Assessment:                           - Prior to the procedure, a History and Physical                            was performed, and patient medications and                            allergies were reviewed. The patient's tolerance of                            previous anesthesia was also reviewed. The risks                            and benefits of the procedure and the sedation                            options and risks were discussed with the patient.                            All questions were answered, and informed consent                            was obtained. Prior Anticoagulants: The patient has                            taken no anticoagulant or antiplatelet agents. ASA                            Grade Assessment: II - A patient with mild systemic                            disease. After reviewing the risks and benefits,                            the patient was deemed in satisfactory condition to  undergo the procedure.                           After obtaining informed consent, the colonoscope                            was passed under direct vision. Throughout the                            procedure, the patient's blood pressure, pulse, and                            oxygen saturations were monitored continuously. The                            Olympus  Scope SN [id number] was introduced through the                            anus and advanced to the the cecum, identified by                            appendiceal orifice and ileocecal valve. The                            ileocecal valve, appendiceal orifice, and rectum                            were photographed. The quality of the bowel                            preparation was good. The colonoscopy was performed                            without difficulty. The patient tolerated the                            procedure well. Scope In: 8:33:17 AM Scope Out: 8:47:09 AM Scope Withdrawal Time: 0 hours 9 minutes 1 second  Total Procedure Duration: 0 hours 13 minutes 52 seconds  Findings:                 The perianal and digital rectal examinations were                            normal.                           A 6 mm polyp was found in the distal transverse                            colon. The polyp was sessile. The polyp was removed                            with a cold snare. Resection and retrieval were  complete.                           A single small angioectasia without bleeding was                            found in the cecum.                           External and internal hemorrhoids were found during                            retroflexion. The hemorrhoids were moderate and                            Grade I (internal hemorrhoids that do not prolapse).                           The exam was otherwise without abnormality on                            direct and retroflexion views. Complications:            No immediate complications. Estimated blood loss:                            None. Estimated Blood Loss:     Estimated blood loss: none. Impression:               - One 6 mm polyp in the distal transverse colon,                            removed with a cold snare. Resected and retrieved.                           - Single, small angioectasia  in the cecum.                           - External and internal hemorrhoids.                           - The examination was otherwise normal on direct                            and retroflexion views. Recommendation:           - Repeat colonoscopy after studies are complete for                            surveillance based on pathology results.                           - Patient has a contact number available for                            emergencies. The signs and symptoms of potential  delayed complications were discussed with the                            patient. Return to normal activities tomorrow.                            Written discharge instructions were provided to the                            patient.                           - Resume previous diet.                           - Continue present medications.                           - Await pathology results. Meryl Dare, MD 07/31/2023 8:51:34 AM This report has been signed electronically.

## 2023-07-31 NOTE — Patient Instructions (Signed)
Resume previous diet Continue present medications Await pathology results  Handouts/information given for polyps.  YOU HAD AN ENDOSCOPIC PROCEDURE TODAY AT THE [facility]:   Refer to the procedure report that was given to you for any specific questions about what was found during the examination.  If the procedure report does not answer your questions, please call your gastroenterologist to clarify.  If you requested that your care partner not be given the details of your procedure findings, then the procedure report has been included in a sealed envelope for you to review at your convenience later.  YOU SHOULD EXPECT: Some feelings of bloating in the abdomen. Passage of more gas than usual.  Walking can help get rid of the air that was put into your GI tract during the procedure and reduce the bloating. If you had a lower endoscopy (such as a colonoscopy or flexible sigmoidoscopy) you may notice spotting of blood in your stool or on the toilet paper. If you underwent a bowel prep for your procedure, you may not have a normal bowel movement for a few days.  Please Note:  You might notice some irritation and congestion in your nose or some drainage.  This is from the oxygen used during your procedure.  There is no need for concern and it should clear up in a day or so.  SYMPTOMS TO REPORT IMMEDIATELY:  Following lower endoscopy (colonoscopy or flexible sigmoidoscopy):  Excessive amounts of blood in the stool  Significant tenderness or worsening of abdominal pains  Swelling of the abdomen that is new, acute  Fever of 100F or higher  For urgent or emergent issues, a gastroenterologist can be reached at any hour by calling [phone]. Do not use MyChart messaging for urgent concerns.    DIET:  We do recommend a small meal at first, but then you may proceed to your regular diet.  Drink plenty of fluids but you should avoid alcoholic beverages for 24 hours.  ACTIVITY:  You  should plan to take it easy for the rest of today and you should NOT DRIVE or use heavy machinery until tomorrow (because of the sedation medicines used during the test).    FOLLOW UP: Our staff will call the number listed on your records the next business day following your procedure.  We will call around 7:15- 8:00 am to check on you and address any questions or concerns that you may have regarding the information given to you following your procedure. If we do not reach you, we will leave a message.     If any biopsies were taken you will be contacted by phone or by letter within the next 1-3 weeks.  Please call us at [phone] if you have not heard about the biopsies in 3 weeks.    SIGNATURES/CONFIDENTIALITY: You and/or your care partner have signed paperwork which will be entered into your electronic medical record.  These signatures attest to the fact that that the information above on your After Visit Summary has been reviewed and is understood.  Full responsibility of the confidentiality of this discharge information lies with you and/or your care-partner.

## 2023-07-31 NOTE — Progress Notes (Signed)
Called to room to assist during endoscopic procedure.  Patient ID and intended procedure confirmed with present staff. Received instructions for my participation in the procedure from the performing physician.  

## 2023-07-31 NOTE — Progress Notes (Signed)
Sedate, gd SR, tolerated procedure well, VSS, report to RN 

## 2023-07-31 NOTE — Progress Notes (Signed)
 See 07/10/2023 H&P no changes

## 2023-08-01 ENCOUNTER — Telehealth: Payer: Self-pay

## 2023-08-01 NOTE — Telephone Encounter (Signed)
Follow up call to pt, lm for pt to call if having any difficulty with normal activities or eating and drinking.  Also to call if any other questions or concerns.  

## 2023-08-02 LAB — SURGICAL PATHOLOGY

## 2023-08-06 ENCOUNTER — Ambulatory Visit (INDEPENDENT_AMBULATORY_CARE_PROVIDER_SITE_OTHER): Payer: Medicare Other

## 2023-08-06 VITALS — Ht 66.0 in | Wt 210.0 lb

## 2023-08-06 DIAGNOSIS — Z0000 Encounter for general adult medical examination without abnormal findings: Secondary | ICD-10-CM | POA: Diagnosis not present

## 2023-08-06 NOTE — Progress Notes (Signed)
Subjective:   Jillian [name] is a 67 y.o. female who presents for Medicare Annual (Subsequent) preventive examination.  Visit Complete: Virtual I connected with  Jerene Bears on 08/06/23 by a audio enabled telemedicine application and verified that I am speaking with the correct person using two identifiers.  Patient Location: Home  Provider Location: Office/Clinic  I discussed the limitations of evaluation and management by telemedicine. The patient expressed understanding and agreed to proceed.  Vital Signs: Because this visit was a virtual/telehealth visit, some criteria may be missing or patient reported. Any vitals not documented were not able to be obtained and vitals that have been documented are patient reported.  Patient Medicare AWV questionnaire was completed by the patient on (not ; I have confirmed that all information answered by patient is correct and no changes since this date.  Cardiac Risk Factors include: advanced age (>65men, >4 women);dyslipidemia;hypertension;obesity (BMI >30kg/m2)    Objective:    Today's Vitals   08/06/23 1041  Weight: 210 lb (95.3 kg)  Height: 5\' 6"  (1.676 m)   Body mass index is 33.89 kg/m.     08/06/2023   10:52 AM 08/15/2022    2:53 PM 06/12/2022    2:48 PM 07/26/2021    9:06 AM 11/06/2019   10:16 AM 07/26/2017    4:50 PM  Advanced Directives  Does Patient Have a Medical Advance Directive? Yes Yes Yes Yes No No  Type of Estate agent of [place];Living will Healthcare Power of [place];Living will Healthcare Power of [employer] of [place];Living will    Does patient want to make changes to medical advance directive?  No - Patient declined No - Patient declined Yes (MAU/Ambulatory/Procedural Areas - Information given)    Copy of Healthcare Power of Attorney in Chart? No - copy requested Yes - validated most recent copy scanned in chart (See row information)      Would patient  like information on creating a medical advance directive?     Yes (MAU/Ambulatory/Procedural Areas - Information given) No - Patient declined    Current Medications (verified) Outpatient Encounter Medications as of 08/06/2023  Medication Sig   Ascorbic Acid (VITAMIN C) 1000 MG tablet Take 1,000 mg by mouth daily.   aspirin EC 81 MG tablet Take 81 mg by mouth daily.   atorvastatin (LIPITOR) 20 MG tablet Take 1 tablet (20 mg total) by mouth every evening. for cholesterol   buPROPion (WELLBUTRIN SR) 200 MG 12 hr tablet Take 1 tablet (200 mg total) by mouth every morning.   CALCIUM-MAGNESIUM-ZINC PO Take 1 capsule by mouth daily.   cetirizine (ZYRTEC) 10 MG tablet Take 1 tablet (10 mg total) by mouth daily as needed.   chlorthalidone (HYGROTON) 25 MG tablet Take 1 tablet (25 mg total) by mouth every morning.   Coenzyme Q10 (CO Q 10) 100 MG CAPS Take 1 capsule by mouth daily.   GARLIC PO Take 725 mg by mouth daily.   metFORMIN (GLUCOPHAGE) 500 MG tablet Take 1 tablet (500 mg total) by mouth 2 (two) times daily with a meal. For blood sugar.   Multiple Vitamins-Minerals (ALIVE ONCE DAILY WOMENS 50+) TABS Take 1 tablet by mouth daily.   Omega-3 1000 MG CAPS Take 1 capsule by mouth daily.   Probiotic Product (PROBIOTIC-10) CAPS Take 1 capsule by mouth daily.   Turmeric Curcumin 500 MG CAPS Take 1 capsule by mouth daily.   vitamin E 400 UNIT capsule Take 400 Units by mouth daily.  fluocinonide-emollient (LIDEX-E) 0.05 % cream APPLY  CREAM EXTERNALLY TWICE DAILY TO  AFFECTED  AREA (Patient not taking: Reported on 08/06/2023)   fluticasone (FLONASE) 50 MCG/ACT nasal spray Place 1 spray into both nostrils daily as needed. (Patient not taking: Reported on 08/06/2023)   Hydroquinone 8 % EMUL Apply topically. (Patient not taking: Reported on 08/06/2023)   meloxicam (MOBIC) 15 MG tablet Take 1 tablet (15 mg total) by mouth daily. (Patient not taking: Reported on 08/06/2023)   NONFORMULARY OR COMPOUNDED ITEM  Apply topically. Hydroquinone 8% compound cream sent by Dermatology to [facility] (Patient not taking: Reported on 08/06/2023)   scopolamine (TRANSDERM-SCOP) 1 MG/3DAYS Place 1 patch (1.5 mg total) onto the skin every 3 (three) days. (Patient not taking: Reported on 08/06/2023)   No facility-administered encounter medications on file as of 08/06/2023.    Allergies (verified) Patient has no known allergies.   History: Past Medical History:  Diagnosis Date   Allergic rhinitis    Allergy    Arthritis    Arthritis of low back    Back pain    Cataract    Cataracts, bilateral    Constipation    Depression    Dry skin    on hands   Fibroid uterus    Hyperlipidemia    Joint pain    Osteoarthritis of right shoulder    Pre-diabetes    Ringing in ears    Sciatica    sciatica   Vitamin D deficiency    Past Surgical History:  Procedure Laterality Date   ABDOMINAL HYSTERECTOMY     broken ankle     CARPAL TUNNEL RELEASE Bilateral    CATARACT EXTRACTION W/ INTRAOCULAR LENS IMPLANT Bilateral 05/22/2021   2nd eye done 07/03/21   SHOULDER SURGERY Right    SKIN BIOPSY Right 10/2021   Family History  Problem Relation Age of Onset   Diabetes Mother        father and 9 siblings   Kidney disease Mother        renal failure   Hypertension Mother    Hyperlipidemia Mother    Stroke Mother    Depression Mother    Anxiety disorder Mother    Obesity Mother    Heart failure Mother    Heart disease Father        paternal grandmother   Obesity Father    Diabetes Father    Hypertension Father    Hyperlipidemia Father    Eating disorder Father    Colon cancer Sister    Kidney cancer Sister    Colon polyps Sister    Prostate cancer Brother    Esophageal cancer Neg Hx    Rectal cancer Neg Hx    Stomach cancer Neg Hx    Social History   Socioeconomic History   Marital status: Married    Spouse name: Milly Jakob   Number of children: 0   Years of education: Not on file    Highest education level: Not on file  Occupational History   Occupation: Retired [employer]  Tobacco Use   Smoking status: Former    Current packs/day: 1.00    Average packs/day: 1 pack/day for 29.0 years (29.0 ttl pk-yrs)    Types: Cigarettes   Smokeless tobacco: Never   Tobacco comments:    30 years  Vaping Use   Vaping status: Never Used  Substance and Sexual Activity   Alcohol use: No   Drug use: No   Sexual activity:  Not on file  Other Topics Concern   Not on file  Social History Narrative   Not on file   Social Drivers of Health   Financial Resource Strain: Low Risk  (08/06/2023)   Overall Financial Resource Strain (CARDIA)    Difficulty of Paying Living Expenses: Not hard at all  Food Insecurity: No Food Insecurity (08/06/2023)   Hunger Vital Sign    Worried About Running Out of Food in the Last Year: Never true    Ran Out of Food in the Last Year: Never true  Transportation Needs: No Transportation Needs (08/06/2023)   PRAPARE - Administrator, Civil Service (Medical): No    Lack of Transportation (Non-Medical): No  Physical Activity: Insufficiently Active (08/06/2023)   Exercise Vital Sign    Days of Exercise per Week: 3 days    Minutes of Exercise per Session: 30 min  Stress: No Stress Concern Present (08/06/2023)   [employer] of Occupational Health - Occupational Stress Questionnaire    Feeling of Stress : Not at all  Social Connections: Socially Integrated (08/06/2023)   Social Connection and Isolation Panel [NHANES]    Frequency of Communication with Friends and Family: More than three times a week    Frequency of Social Gatherings with Friends and Family: Once a week    Attends Religious Services: More than 4 times per year    Active Member of [employer] or Organizations: Yes    Attends Engineer, structural: More than 4 times per year    Marital Status: Married    Tobacco Counseling Counseling given: Not Answered Tobacco  comments: 30 years   Clinical Intake:  Pre-visit preparation completed: Yes  Pain : No/denies pain   BMI - recorded: 33.89 Nutritional Status: BMI > 30  Obese Nutritional Risks: None Diabetes: No  How often do you need to have someone help you when you read instructions, pamphlets, or other written materials from your doctor or pharmacy?: 1 - Never  Interpreter Needed?: No  Comments: lives with husband Information entered by :: B.Tula Schryver,LPN   Activities of Daily Living    08/06/2023   10:52 AM 08/15/2022    2:54 PM  In your present state of health, do you have any difficulty performing the following activities:  Hearing? 0 0  Vision? 0 0  Difficulty concentrating or making decisions? 0 0  Walking or climbing stairs? 0 0  Dressing or bathing? 0 0  Doing errands, shopping? 0 0  Preparing Food and eating ? N N  Using the Toilet? N N  In the past six months, have you accidently leaked urine? N N  Do you have problems with loss of bowel control? N N  Managing your Medications? N N  Managing your Finances? N N  Housekeeping or managing your Housekeeping? N N    Patient Care Team: Doreene Nest, NP as PCP - General (Internal Medicine) Jesusita Oka, MD as Consulting Physician (Dermatology) Fredrich Birks, OD as Referring Physician (Optometry)  Indicate any recent Medical Services you may have received from other than [facility] providers in the past year (date may be approximate).     Assessment:   This is a routine wellness examination for Miriya.  Hearing/Vision screen Hearing Screening - Comments:: Pt says her hearing is good Vision Screening - Comments:: Pt says her vision is ok after cataract surgery Dr Lorin Picket   Goals Addressed             This Visit's  Progress    Increase physical activity   On track    As tolerated..pt wants to continue tis goal     Patient Stated   On track    Would like to maintain current health- eating healthier, drinking  more water and exercising. Pt would like to continue this goal       Depression Screen    08/06/2023   10:49 AM 08/15/2022    2:49 PM 08/01/2021   10:22 AM 07/26/2021    9:11 AM 12/28/2020    9:24 AM 07/20/2020    9:08 AM 06/12/2018    8:32 AM  PHQ 2/9 Scores  PHQ - 2 Score 0 0 0 0 2 0 2  PHQ- 9 Score   0  4 3 9     Fall Risk    08/06/2023   10:45 AM 08/15/2022    2:53 PM 07/26/2021    9:09 AM 07/20/2020    9:08 AM 05/28/2017   12:11 PM  Fall Risk   Falls in the past year? 0 0 0 0 No  Number falls in past yr: 0 0 0 0   Injury with Fall? 0 0 0 0   Risk for fall due to : No Fall Risks  No Fall Risks    Follow up Education provided;Falls prevention discussed Falls evaluation completed;Falls prevention discussed Falls prevention discussed      MEDICARE RISK AT HOME: Medicare Risk at Home Any stairs in or around the home?: Yes If so, are there any without handrails?: Yes Home free of loose throw rugs in walkways, pet beds, electrical cords, etc?: Yes Adequate lighting in your home to reduce risk of falls?: Yes Life alert?: No Use of a cane, walker or w/c?: No Grab bars in the bathroom?: Yes Shower chair or bench in shower?: No Elevated toilet seat or a handicapped toilet?: Yes  TIMED UP AND GO:  Was the test performed?  No    Cognitive Function:        08/06/2023   10:58 AM 08/15/2022    2:55 PM  6CIT Screen  What Year? 0 points 0 points  What month? 0 points 0 points  What time? 0 points 0 points  Count back from 20 0 points 0 points  Months in reverse 0 points 0 points  Repeat phrase 0 points 0 points  Total Score 0 points 0 points    Immunizations Immunization History  Administered Date(s) Administered   Fluad Quad(high Dose 65+) 05/12/2021, 05/12/2021, 05/22/2022, 06/12/2023   Influenza Split 07/16/2011, 05/28/2012   Influenza,inj,Quad PF,6+ Mos 06/02/2013, 05/26/2014, 06/23/2015, 05/25/2016, 05/28/2017, 06/02/2018, 06/08/2019, 05/28/2020    PFIZER(Purple Top)SARS-COV-2 Vaccination 11/19/2019, 12/14/2019, 07/02/2020, 12/19/2020   PNEUMOCOCCAL CONJUGATE-20 08/01/2021   Pfizer Covid-19 Vaccine Bivalent Booster 83yrs & up 06/11/2021, 06/06/2023   RSV,unspecified 06/01/2022   Td 10/07/1996, 03/01/2009   Tdap 06/08/2019   Unspecified SARS-COV-2 Vaccination 05/28/2022   Zoster Recombinant(Shingrix) 09/09/2018, 11/16/2018   Zoster, Live 11/10/2013    TDAP status: Up to date  Flu Vaccine status: Up to date  Pneumococcal vaccine status: Up to date  Covid-19 vaccine status: Completed vaccines  Qualifies for Shingles Vaccine? Yes   Zostavax completed Yes   Shingrix Completed?: Yes  Screening Tests Health Maintenance  Topic Date Due   COVID-19 Vaccine (8 - 2024-25 season) 08/01/2023   MAMMOGRAM  07/15/2024   Medicare Annual Wellness (AWV)  08/05/2024   Colonoscopy  07/30/2028   DTaP/Tdap/Td (4 - Td or Tdap) 06/07/2029   Pneumonia Vaccine  39+ Years old  Completed   INFLUENZA VACCINE  Completed   DEXA SCAN  Completed   Hepatitis C Screening  Completed   Zoster Vaccines- Shingrix  Completed   HPV VACCINES  Aged Out   Lung Cancer Screening  Discontinued    Health Maintenance  Health Maintenance Due  Topic Date Due   COVID-19 Vaccine (8 - 2024-25 season) 08/01/2023    Colorectal cancer screening: Type of screening: Colonoscopy. Completed 07/30/2018. Repeat every 10 years  Mammogram status: Completed 07/16/23. Repeat every year  Bone Density status: Completed 08/23/2021. Results reflect: Bone density results: NORMAL. Repeat every 5 years.  Lung Cancer Screening: (Low Dose CT Chest recommended if Age 3-80 years, 20 pack-year currently smoking OR have quit w/in 15years.) does not qualify.   Lung Cancer Screening Referral: no  Additional Screening:  Hepatitis C Screening: does not qualify; Completed 05/21/2017  Vision Screening: Recommended annual ophthalmology exams for early detection of glaucoma and other  disorders of the eye. Is the patient up to date with their annual eye exam?  Yes  Who is the provider or what is the name of the office in which the patient attends annual eye exams? Dr Lorin Picket If pt is not established with a provider, would they like to be referred to a provider to establish care? No .   Dental Screening: Recommended annual dental exams for proper oral hygiene  Diabetic Foot Exam: n/a  Community Resource Referral / Chronic Care Management: CRR required this visit?  No   CCM required this visit?  No    Plan:     I have personally reviewed and noted the following in the patient's chart:   Medical and social history Use of alcohol, tobacco or illicit drugs  Current medications and supplements including opioid prescriptions. Patient is not currently taking opioid prescriptions. Functional ability and status Nutritional status Physical activity Advanced directives List of other physicians Hospitalizations, surgeries, and ER visits in previous 12 months Vitals Screenings to include cognitive, depression, and falls Referrals and appointments  In addition, I have reviewed and discussed with patient certain preventive protocols, quality metrics, and best practice recommendations. A written personalized care plan for preventive services as well as general preventive health recommendations were provided to patient.     Sue Lush, LPN   [id number]   After Visit Summary: (MyChart) Due to this being a telephonic visit, the after visit summary with patients personalized plan was offered to patient via MyChart   Nurse Notes: The patient states she is doing well and has no concerns or questions at this time.

## 2023-08-06 NOTE — Patient Instructions (Signed)
Jillian [name] , Thank you for taking time to come for your Medicare Wellness Visit. I appreciate your ongoing commitment to your health goals. Please review the following plan we discussed and let me know if I can assist you in the future.   Referrals/Orders/Follow-Ups/Clinician Recommendations: none  This is a list of the screening recommended for you and due dates:  Health Maintenance  Topic Date Due   COVID-19 Vaccine (8 - 2024-25 season) 08/01/2023   Mammogram  07/15/2024   Medicare Annual Wellness Visit  08/05/2024   Colon Cancer Screening  07/30/2028   DTaP/Tdap/Td vaccine (4 - Td or Tdap) 06/07/2029   Pneumonia Vaccine  Completed   Flu Shot  Completed   DEXA scan (bone density measurement)  Completed   Hepatitis C Screening  Completed   Zoster (Shingles) Vaccine  Completed   HPV Vaccine  Aged Out   Screening for Lung Cancer  Discontinued    Advanced directives: (Copy Requested) Please bring a copy of your health care power of attorney and living will to the office to be added to your chart at your convenience.  Next Medicare Annual Wellness Visit scheduled for next year: Yes 08/06/24 @ 10:50am telephone

## 2023-08-08 DIAGNOSIS — E785 Hyperlipidemia, unspecified: Secondary | ICD-10-CM

## 2023-08-09 MED ORDER — ATORVASTATIN CALCIUM 20 MG PO TABS: 20.0000 mg | ORAL_TABLET | Freq: Every evening | ORAL | 0 refills | Status: DC

## 2023-08-12 ENCOUNTER — Ambulatory Visit (INDEPENDENT_AMBULATORY_CARE_PROVIDER_SITE_OTHER): Payer: Medicare Other | Admitting: Adult Health

## 2023-08-12 ENCOUNTER — Encounter (INDEPENDENT_AMBULATORY_CARE_PROVIDER_SITE_OTHER): Payer: Self-pay | Admitting: Adult Health

## 2023-08-12 VITALS — BP 126/85 | HR 97 | Temp 98.1°F | Ht 66.0 in | Wt 206.0 lb

## 2023-08-12 DIAGNOSIS — E74819 Disorders of glucose transport, unspecified: Secondary | ICD-10-CM

## 2023-08-12 DIAGNOSIS — E782 Mixed hyperlipidemia: Secondary | ICD-10-CM | POA: Diagnosis not present

## 2023-08-12 DIAGNOSIS — E669 Obesity, unspecified: Secondary | ICD-10-CM

## 2023-08-12 DIAGNOSIS — I10 Essential (primary) hypertension: Secondary | ICD-10-CM

## 2023-08-12 DIAGNOSIS — E559 Vitamin D deficiency, unspecified: Secondary | ICD-10-CM | POA: Diagnosis not present

## 2023-08-12 DIAGNOSIS — Z6833 Body mass index (BMI) 33.0-33.9, adult: Secondary | ICD-10-CM

## 2023-08-12 DIAGNOSIS — Z6832 Body mass index (BMI) 32.0-32.9, adult: Secondary | ICD-10-CM

## 2023-08-12 DIAGNOSIS — R7303 Prediabetes: Secondary | ICD-10-CM | POA: Diagnosis not present

## 2023-08-12 NOTE — Progress Notes (Signed)
WEIGHT SUMMARY AND BIOMETRICS  Vitals Temp: 98.1 F (36.7 C) BP: 126/85 Pulse Rate: 97 SpO2: 98 %   Anthropometric Measurements Height: 5\' 6"  (1.676 m) Weight: 206 lb (93.4 kg) BMI (Calculated): 33.27 Weight at Last Visit: 204 lb Weight Lost Since Last Visit: 0 Weight Gained Since Last Visit: 2 lb Starting Weight: 223 lb Total Weight Loss (lbs): 17 lb (7.711 kg) Peak Weight: 230 lb   Body Composition  Body Fat %: 44 % Fat Mass (lbs): 90.8 lbs Muscle Mass (lbs): 109.6 lbs Total Body Water (lbs): 75.2 lbs Visceral Fat Rating : 13   Other Clinical Data Fasting: Yes Labs: Yes Today's Visit #: 34 Starting Date: 12/28/20    Chief Complaint:   OBESITY Jillian [name] is here to discuss her progress with her obesity treatment plan. She is on the the Category 2 Plan and states she is following her eating plan approximately 50 % of the time.  She states she is not currently exercising.   Interim History:  [name] reports that her sister "Jillian [name]" recently passed away. Jillian [name] was 50 and lived in [place]. She was laid to rest in a lovely service at the end of Nov 2024. She endorses strong local support system  Exercise-none at present, however interested in restarting regular exercise after the holiday season.  Hydration-she estimates to drink 6-7 bottles of water, bottles are 16.9 oz  Subjective:   1. Mixed hyperlipidemia Lipid Panel     Component Value Date/Time   CHOL 207 (H) 01/02/2023 1330   TRIG 49 01/02/2023 1330   HDL 95 01/02/2023 1330   CHOLHDL 2 07/11/2020 0834   VLDL 9.2 07/11/2020 0834   LDLCALC 103 (H) 01/02/2023 1330   LDLDIRECT 158.6 08/18/2013 0803   LABVLDL 9 01/02/2023 1330    She is on Omega 3 Fatty Acids aspirin EC 81 MG tablet  chlorthalidone (HYGROTON) 25 MG tablet  atorvastatin (LIPITOR) 20 MG tablet   She denies myalgias   2. Essential hypertension BP at goal at OV She is taking  aspirin EC 81 MG tablet   chlorthalidone (HYGROTON) 25 MG tablet  atorvastatin (LIPITOR) 20 MG tablet   3. Vitamin D deficiency  Latest Reference Range & Units 07/17/22 09:04 01/02/23 13:30  Vitamin D, 25-Hydroxy 30.0 - 100.0 ng/mL 56.2 56.3   She endorses stable energy levels. She is on daily OTC Vit D3 4000 IU vitamin D    4. Prediabetes She is on Metforming 500mg  BID She briefly held Metformin for her recent Colonoscopy on 07/31/2023 She restarted, denies GI upset  5. Disorders of glucose transport, unspecified (HCC) She is on Metforming 500mg  BID She briefly held Metformin for her recent Colonoscopy on 07/31/2023 She restarted, denies GI upset She reports + family hx of diabetes  Assessment/Plan:   1. Mixed hyperlipidemia Check Labs - Lipid Panel With LDL/HDL Ratio  2. Essential hypertension (Primary) Check Labs - CMP14+EGFR - Hemoglobin A1c - Insulin, random  3. Vitamin D deficiency Check Labs - VITAMIN D 25 Hydroxy (Vit-D Deficiency, Fractures) Continue daily OTC Vit D3 4000 IU vitamin D  4. Prediabetes Check Labs - Vitamin B12  5. Disorders of glucose transport, unspecified (HCC) Check Labs - Hemoglobin A1c  6. BMI 32.0-32.9,adult, Current BMI 33.3  Jillian [name] is currently in the action stage of change. As such, her goal is to continue with weight loss efforts. She has agreed to the Category 2 Plan.   Exercise goals: Older adults should follow the adult guidelines.  When older adults cannot meet the adult guidelines, they should be as physically active as their abilities and conditions will allow.  Older adults should do exercises that maintain or improve balance if they are at risk of falling.  Older adults should determine their level of effort for physical activity relative to their level of fitness.  Older adults with chronic conditions should understand whether and how their conditions affect their ability to do regular physical activity safely.  Behavioral modification  strategies: increasing lean protein intake, decreasing simple carbohydrates, increasing vegetables, increasing water intake, meal planning and cooking strategies, keeping healthy foods in the home, ways to avoid boredom eating, and planning for success.  [name] has agreed to follow-up with our clinic in 4 weeks. She was informed of the importance of frequent follow-up visits to maximize her success with intensive lifestyle modifications for her multiple health conditions.   Jillian [name] was informed we would discuss her lab results at her next visit unless there is a critical issue that needs to be addressed sooner. Jillian [name] agreed to keep her next visit at the agreed upon time to discuss these results.  Objective:   Blood pressure 126/85, pulse 97, temperature 98.1 F (36.7 C), height 5\' 6"  (1.676 m), weight 206 lb (93.4 kg), SpO2 98%. Body mass index is 33.25 kg/m.  General: Cooperative, alert, well developed, in no acute distress. HEENT: Conjunctivae and lids unremarkable. Cardiovascular: Regular rhythm.  Lungs: Normal work of breathing. Neurologic: No focal deficits.   Lab Results  Component Value Date   CREATININE 0.75 02/06/2023   BUN 13 02/06/2023   NA 142 02/06/2023   K 3.7 02/06/2023   CL 100 02/06/2023   CO2 28 02/06/2023   Lab Results  Component Value Date   ALT 22 02/06/2023   AST 24 02/06/2023   ALKPHOS 65 02/06/2023   BILITOT 0.4 02/06/2023   Lab Results  Component Value Date   HGBA1C 6.0 (H) 01/02/2023   HGBA1C 5.7 (H) 07/17/2022   HGBA1C 5.6 03/05/2022   HGBA1C 5.7 (H) 11/06/2021   HGBA1C 5.6 07/18/2021   Lab Results  Component Value Date   INSULIN 10.2 01/02/2023   INSULIN 13.0 07/17/2022   INSULIN 7.8 03/05/2022   INSULIN 11.6 11/06/2021   INSULIN 10.9 07/18/2021   Lab Results  Component Value Date   TSH 2.300 01/02/2023   Lab Results  Component Value Date   CHOL 207 (H) 01/02/2023   HDL 95 01/02/2023   LDLCALC 103 (H) 01/02/2023   LDLDIRECT  158.6 08/18/2013   TRIG 49 01/02/2023   CHOLHDL 2 07/11/2020   Lab Results  Component Value Date   VD25OH 56.3 01/02/2023   VD25OH 56.2 07/17/2022   VD25OH 63.1 03/05/2022   Lab Results  Component Value Date   WBC 4.3 03/05/2022   HGB 13.5 03/05/2022   HCT 41.9 03/05/2022   MCV 88 03/05/2022   PLT 236 03/05/2022   No results found for: "IRON", "TIBC", "FERRITIN"  Attestation Statements:   Reviewed by clinician on day of visit: allergies, medications, problem list, medical history, surgical history, family history, social history, and previous encounter notes.  I have reviewed the above documentation for accuracy and completeness, and I agree with the above. -  Anatalia Kronk d. Analysse Quinonez, NP-C

## 2023-08-13 ENCOUNTER — Ambulatory Visit (INDEPENDENT_AMBULATORY_CARE_PROVIDER_SITE_OTHER): Payer: Medicare Other | Admitting: Family Medicine

## 2023-08-13 LAB — CMP14+EGFR
ALT: 25 [IU]/L (ref 0–32)
AST: 24 [IU]/L (ref 0–40)
Albumin: 4.4 g/dL (ref 3.9–4.9)
Alkaline Phosphatase: 67 [IU]/L (ref 44–121)
BUN/Creatinine Ratio: 14 (ref 12–28)
BUN: 11 mg/dL (ref 8–27)
Bilirubin Total: 0.3 mg/dL (ref 0.0–1.2)
CO2: 25 mmol/L (ref 20–29)
Calcium: 9.3 mg/dL (ref 8.7–10.3)
Chloride: 101 mmol/L (ref 96–106)
Creatinine, Ser: 0.76 mg/dL (ref 0.57–1.00)
Globulin, Total: 2.9 g/dL (ref 1.5–4.5)
Glucose: 93 mg/dL (ref 70–99)
Potassium: 3.8 mmol/L (ref 3.5–5.2)
Sodium: 142 mmol/L (ref 134–144)
Total Protein: 7.3 g/dL (ref 6.0–8.5)
eGFR: 86 mL/min/{1.73_m2} (ref >59)

## 2023-08-13 LAB — VITAMIN B12: Vitamin B-12: >2000 pg/mL — ABNORMAL HIGH (ref 232–1245)

## 2023-08-13 LAB — LIPID PANEL WITH LDL/HDL RATIO
Cholesterol, Total: 210 mg/dL — ABNORMAL HIGH (ref 100–199)
HDL: 99 mg/dL (ref >39)
LDL Chol Calc (NIH): 103 mg/dL — ABNORMAL HIGH (ref 0–99)
LDL/HDL Ratio: 1 {ratio} (ref 0.0–3.2)
Triglycerides: 42 mg/dL (ref 0–149)
VLDL Cholesterol Cal: 8 mg/dL (ref 5–40)

## 2023-08-13 LAB — VITAMIN D 25 HYDROXY (VIT D DEFICIENCY, FRACTURES): Vit D, 25-Hydroxy: 70.5 ng/mL (ref 30.0–100.0)

## 2023-08-13 LAB — INSULIN, RANDOM: INSULIN: 8.9 u[IU]/mL (ref 2.6–24.9)

## 2023-08-13 LAB — HEMOGLOBIN A1C
Est. average glucose Bld gHb Est-mCnc: 128 mg/dL
Hgb A1c MFr Bld: 6.1 % — ABNORMAL HIGH (ref 4.8–5.6)

## 2023-08-15 ENCOUNTER — Ambulatory Visit (INDEPENDENT_AMBULATORY_CARE_PROVIDER_SITE_OTHER): Payer: Medicare Other | Admitting: Primary Care

## 2023-08-15 ENCOUNTER — Encounter: Payer: Self-pay | Admitting: Primary Care

## 2023-08-15 VITALS — BP 118/78 | HR 88 | Temp 97.8°F | Ht 66.0 in | Wt 215.0 lb

## 2023-08-15 DIAGNOSIS — E559 Vitamin D deficiency, unspecified: Secondary | ICD-10-CM

## 2023-08-15 DIAGNOSIS — E2839 Other primary ovarian failure: Secondary | ICD-10-CM

## 2023-08-15 DIAGNOSIS — M5442 Lumbago with sciatica, left side: Secondary | ICD-10-CM

## 2023-08-15 DIAGNOSIS — R7303 Prediabetes: Secondary | ICD-10-CM

## 2023-08-15 DIAGNOSIS — I10 Essential (primary) hypertension: Secondary | ICD-10-CM

## 2023-08-15 DIAGNOSIS — E7849 Other hyperlipidemia: Secondary | ICD-10-CM

## 2023-08-15 DIAGNOSIS — G8929 Other chronic pain: Secondary | ICD-10-CM

## 2023-08-15 DIAGNOSIS — F330 Major depressive disorder, recurrent, mild: Secondary | ICD-10-CM

## 2023-08-15 DIAGNOSIS — M5441 Lumbago with sciatica, right side: Secondary | ICD-10-CM

## 2023-08-15 NOTE — Assessment & Plan Note (Signed)
Controlled.  Continue chlorthalidone 25 mg daily. CMP reviewed from December 2024.

## 2023-08-15 NOTE — Assessment & Plan Note (Signed)
Controlled.  Continue bupropion SR 200 mg daily.

## 2023-08-15 NOTE — Assessment & Plan Note (Signed)
Slightly deteriorated.  Encouraged to increase physical activity.  Continue to work with healthy weight and wellness center.

## 2023-08-15 NOTE — Assessment & Plan Note (Signed)
Controlled.  Reviewed vitamin D level from December 2024.

## 2023-08-15 NOTE — Assessment & Plan Note (Signed)
Waxes and wanes. Currently deteriorated.   Continue Meloxicam 15 mg daily as needed. She uses sparingly. Offered prednisolone treatment today, she declines but will notify if she changes her mind.  She will work on stretching and Tylenol.

## 2023-08-15 NOTE — Assessment & Plan Note (Signed)
Controlled.   Continue atorvastatin 20 mg daily. Reviewed labs from December 2024

## 2023-08-15 NOTE — Progress Notes (Signed)
Subjective:    Patient ID: Jillian [name], female    DOB: [birth year], 67 y.o.   MRN: [id number]  HPI  Jillian [name] is a very pleasant 67 y.o. female who presents today for follow up of chronic conditions.  1) Essential Hypertension: Currently managed on chlorthalidone 25 mg daily. She denies chest pain, dizziness, headaches.   2) Hyperlipidemia: Currently managed on atorvastatin 20 mg daily.  Last lipid panel was completed 3 days ago with LDL of 103.  Currently following with healthy weight and wellness in [place].  3) Depression: Currently managed on bupropion SR 200 mg daily for healthy weight wellness center in [place]. Overall she feels well managed on this regimen. Denies SI/HI.  4) Prediabetes: Currently managed on metformin 500 mg twice daily per healthy weight wellness center in [place]. She is working to improve her diet by increasing protein and limiting carbs.   Wt Readings from Last 3 Encounters:  08/15/23 215 lb (97.5 kg)  08/12/23 206 lb (93.4 kg)  08/06/23 210 lb (95.3 kg)     Immunizations: -Tetanus: Completed in 2020 -Influenza: Completed this season  -Shingles: Completed Shingrix series -Pneumonia: Completed Prevnar 20 in 2022  Mammogram: November 2024 Bone Density Scan: January 2023  Colonoscopy: Completed in 2024, due 2029   BP Readings from Last 3 Encounters:  08/15/23 118/78  08/12/23 126/85  07/31/23 118/77       Review of Systems  Respiratory:  Negative for shortness of breath.   Cardiovascular:  Negative for chest pain.  Gastrointestinal:  Positive for constipation.  Neurological:  Negative for dizziness and headaches.  Psychiatric/Behavioral:  The patient is not nervous/anxious.          Past Medical History:  Diagnosis Date   Age-related nuclear cataract of both eyes 11/08/2020   Allergic rhinitis    Allergy    Arthritis    Arthritis of low back    Back pain    Cataract    Cataracts, bilateral     Constipation    Depression    Dry skin    on hands   Fibroid uterus    Hyperlipidemia    Joint pain    Osteoarthritis of right shoulder    Pre-diabetes    Ringing in ears    Sciatica    sciatica   Vitamin D deficiency     Social History   Socioeconomic History   Marital status: Married    Spouse name: Milly Jakob   Number of children: 0   Years of education: Not on file   Highest education level: Not on file  Occupational History   Occupation: Retired [employer]  Tobacco Use   Smoking status: Former    Current packs/day: 1.00    Average packs/day: 1 pack/day for 29.0 years (29.0 ttl pk-yrs)    Types: Cigarettes   Smokeless tobacco: Never   Tobacco comments:    30 years  Vaping Use   Vaping status: Never Used  Substance and Sexual Activity   Alcohol use: No   Drug use: No   Sexual activity: Not on file  Other Topics Concern   Not on file  Social History Narrative   Not on file   Social Drivers of Health   Financial Resource Strain: Low Risk  (08/06/2023)   Overall Financial Resource Strain (CARDIA)    Difficulty of Paying Living Expenses: Not hard at all  Food Insecurity: No Food Insecurity (08/06/2023)   Hunger Vital Sign  Worried About Programme researcher, broadcasting/film/video in the Last Year: Never true    Ran Out of Food in the Last Year: Never true  Transportation Needs: No Transportation Needs (08/06/2023)   PRAPARE - Administrator, Civil Service (Medical): No    Lack of Transportation (Non-Medical): No  Physical Activity: Insufficiently Active (08/06/2023)   Exercise Vital Sign    Days of Exercise per Week: 3 days    Minutes of Exercise per Session: 30 min  Stress: No Stress Concern Present (08/06/2023)   [employer] of Occupational Health - Occupational Stress Questionnaire    Feeling of Stress : Not at all  Social Connections: Socially Integrated (08/06/2023)   Social Connection and Isolation Panel [NHANES]    Frequency of  Communication with Friends and Family: More than three times a week    Frequency of Social Gatherings with Friends and Family: Once a week    Attends Religious Services: More than 4 times per year    Active Member of [employer] or Organizations: Yes    Attends Engineer, structural: More than 4 times per year    Marital Status: Married  Catering manager Violence: Not At Risk (08/06/2023)   Humiliation, Afraid, Rape, and Kick questionnaire    Fear of Current or Ex-Partner: No    Emotionally Abused: No    Physically Abused: No    Sexually Abused: No    Past Surgical History:  Procedure Laterality Date   ABDOMINAL HYSTERECTOMY     broken ankle     CARPAL TUNNEL RELEASE Bilateral    CATARACT EXTRACTION W/ INTRAOCULAR LENS IMPLANT Bilateral 05/22/2021   2nd eye done 07/03/21   SHOULDER SURGERY Right    SKIN BIOPSY Right 10/2021    Family History  Problem Relation Age of Onset   Diabetes Mother        father and 9 siblings   Kidney disease Mother        renal failure   Hypertension Mother    Hyperlipidemia Mother    Stroke Mother    Depression Mother    Anxiety disorder Mother    Obesity Mother    Heart failure Mother    Heart disease Father        paternal grandmother   Obesity Father    Diabetes Father    Hypertension Father    Hyperlipidemia Father    Eating disorder Father    Colon cancer Sister    Kidney cancer Sister    Colon polyps Sister    Prostate cancer Brother    Esophageal cancer Neg Hx    Rectal cancer Neg Hx    Stomach cancer Neg Hx     No Known Allergies  Current Outpatient Medications on File Prior to Visit  Medication Sig Dispense Refill   Ascorbic Acid (VITAMIN C) 1000 MG tablet Take 1,000 mg by mouth daily.     aspirin EC 81 MG tablet Take 81 mg by mouth daily.     atorvastatin (LIPITOR) 20 MG tablet Take 1 tablet (20 mg total) by mouth every evening. for cholesterol 90 tablet 0   buPROPion (WELLBUTRIN SR) 200 MG 12 hr tablet Take 1 tablet  (200 mg total) by mouth every morning. 90 tablet 0   CALCIUM-MAGNESIUM-ZINC PO Take 1 capsule by mouth daily.     cetirizine (ZYRTEC) 10 MG tablet Take 1 tablet (10 mg total) by mouth daily as needed. 90 tablet 1   chlorthalidone (HYGROTON) 25 MG tablet Take  1 tablet (25 mg total) by mouth every morning. 90 tablet 0   Coenzyme Q10 (CO Q 10) 100 MG CAPS Take 1 capsule by mouth daily.     fluocinonide-emollient (LIDEX-E) 0.05 % cream APPLY  CREAM EXTERNALLY TWICE DAILY TO  AFFECTED  AREA 60 g 0   fluticasone (FLONASE) 50 MCG/ACT nasal spray Place 1 spray into both nostrils daily as needed. 16 g 2   GARLIC PO Take 295 mg by mouth daily.     meloxicam (MOBIC) 15 MG tablet Take 1 tablet (15 mg total) by mouth daily. 30 tablet 0   metFORMIN (GLUCOPHAGE) 500 MG tablet Take 1 tablet (500 mg total) by mouth 2 (two) times daily with a meal. For blood sugar. 180 tablet 0   Multiple Vitamins-Minerals (ALIVE ONCE DAILY WOMENS 50+) TABS Take 1 tablet by mouth daily.     Omega-3 1000 MG CAPS Take 1 capsule by mouth daily.     Probiotic Product (PROBIOTIC-10) CAPS Take 1 capsule by mouth daily.     Turmeric Curcumin 500 MG CAPS Take 1 capsule by mouth daily.     vitamin E 400 UNIT capsule Take 400 Units by mouth daily.     Hydroquinone 8 % EMUL Apply topically. (Patient not taking: Reported on 08/15/2023)     No current facility-administered medications on file prior to visit.    BP 118/78   Pulse 88   Temp 97.8 F (36.6 C) (Temporal)   Ht 5\' 6"  (1.676 m)   Wt 215 lb (97.5 kg)   SpO2 98%   BMI 34.70 kg/m  Objective:   Physical Exam Cardiovascular:     Rate and Rhythm: Normal rate and regular rhythm.  Pulmonary:     Effort: Pulmonary effort is normal.     Breath sounds: Normal breath sounds.  Musculoskeletal:     Cervical back: Neck supple.  Skin:    General: Skin is warm and dry.  Neurological:     Mental Status: She is alert and oriented to person, place, and time.  Psychiatric:         Mood and Affect: Mood normal.           Assessment & Plan:  Essential hypertension Assessment & Plan: Controlled.  Continue chlorthalidone 25 mg daily. CMP reviewed from December 2024.   Estrogen deficiency -     DG Bone Density; Future  Vitamin D deficiency Assessment & Plan: Controlled.  Reviewed vitamin D level from December 2024.   Prediabetes Assessment & Plan: Slightly deteriorated.  Encouraged to increase physical activity.  Continue to work with healthy weight and wellness center.   Mild episode of recurrent major depressive disorder [facility]) Assessment & Plan: Controlled.   Continue bupropion SR 200 mg daily.    Other hyperlipidemia  Chronic left-sided low back pain with bilateral sciatica Assessment & Plan: Waxes and wanes. Currently deteriorated.   Continue Meloxicam 15 mg daily as needed. She uses sparingly. Offered prednisolone treatment today, she declines but will notify if she changes her mind.  She will work on stretching and Tylenol.         Doreene Nest, NP

## 2023-08-15 NOTE — Patient Instructions (Signed)
Call [facility] to schedule your bone density scan.  It was a pleasure to see you today!

## 2023-08-18 ENCOUNTER — Encounter: Payer: Self-pay | Admitting: Gastroenterology

## 2023-09-04 ENCOUNTER — Ambulatory Visit (INDEPENDENT_AMBULATORY_CARE_PROVIDER_SITE_OTHER): Payer: Medicare Other | Admitting: Family Medicine

## 2023-09-04 ENCOUNTER — Encounter (INDEPENDENT_AMBULATORY_CARE_PROVIDER_SITE_OTHER): Payer: Self-pay | Admitting: Family Medicine

## 2023-09-04 VITALS — BP 130/86 | HR 84 | Temp 98.3°F | Ht 66.0 in | Wt 209.0 lb

## 2023-09-04 DIAGNOSIS — F3289 Other specified depressive episodes: Secondary | ICD-10-CM

## 2023-09-04 DIAGNOSIS — E669 Obesity, unspecified: Secondary | ICD-10-CM | POA: Diagnosis not present

## 2023-09-04 DIAGNOSIS — F5089 Other specified eating disorder: Secondary | ICD-10-CM

## 2023-09-04 DIAGNOSIS — I10 Essential (primary) hypertension: Secondary | ICD-10-CM

## 2023-09-04 DIAGNOSIS — R7303 Prediabetes: Secondary | ICD-10-CM | POA: Diagnosis not present

## 2023-09-04 DIAGNOSIS — Z6833 Body mass index (BMI) 33.0-33.9, adult: Secondary | ICD-10-CM

## 2023-09-04 MED ORDER — BUPROPION HCL ER (SR) 200 MG PO TB12: 200.0000 mg | ORAL_TABLET | Freq: Every morning | ORAL | 0 refills | Status: DC

## 2023-09-04 MED ORDER — CHLORTHALIDONE 25 MG PO TABS: 25.0000 mg | ORAL_TABLET | Freq: Every morning | ORAL | 0 refills | Status: DC

## 2023-09-04 MED ORDER — METFORMIN HCL 500 MG PO TABS: 500.0000 mg | ORAL_TABLET | Freq: Two times a day (BID) | ORAL | 0 refills | Status: DC

## 2023-09-04 NOTE — Progress Notes (Signed)
 .smr  Office: [phone]  /  Fax: [phone]  WEIGHT SUMMARY AND BIOMETRICS  Anthropometric Measurements Height: 5\' 6"  (1.676 m) Weight: 209 lb (94.8 kg) BMI (Calculated): 33.75 Weight at Last Visit: 206 lb Weight Lost Since Last Visit: 0 Weight Gained Since Last Visit: 3 lb Starting Weight: 223 lb Total Weight Loss (lbs): 14 lb (6.35 kg) Peak Weight: 230 lb   Body Composition  Body Fat %: 44.4 % Fat Mass (lbs): 92.8 lbs Muscle Mass (lbs): 110.6 lbs Total Body Water (lbs): 75.8 lbs Visceral Fat Rating : 13   Other Clinical Data Fasting: No Labs: No Today's Visit #: 35 Starting Date: 12/28/20    Chief Complaint: OBESITY  History of Present Illness   The patient, with a history of prediabetes, hypertension, emotional eating behaviors, and obesity, presents for a follow-up visit. Her last hemoglobin A1c, taken in December, was 6.1, and she is currently on metformin  for prediabetes management. She reports adherence to a category two eating plan about 50% of the time but is not currently exercising. Over the holiday period, she gained three pounds.  The patient is also on chlorthalidone  for hypertension, which is well controlled, with a recent reading of 130/86. She is working on diet, exercise, and weight loss to manage her blood pressure and decrease her risk of heart attack and stroke.  For emotional eating behaviors, the patient is on Wellbutrin  and reports stability. She expresses that she has been feeling down recently, but decided against increasing her Wellbutrin  dosage. She attributes her low mood to the recent death of her sister, which she believes is now affecting her more than before due to the end of the busy holiday period.  The patient acknowledges the need to return to her exercise routine, specifically mentioning the use of her home exercise equipment. She expresses a desire to improve her health for her own sake, particularly in light of her elevated A1c  levels. She expresses a preference to manage her health through lifestyle changes rather than additional medication.          PHYSICAL EXAM:  Blood pressure 130/86, pulse 84, temperature 98.3 F (36.8 C), height 5\' 6"  (1.676 m), weight 209 lb (94.8 kg), SpO2 100%. Body mass index is 33.73 kg/m.  DIAGNOSTIC DATA REVIEWED:  BMET    Component Value Date/Time   NA 142 08/12/2023 1434   K 3.8 08/12/2023 1434   CL 101 08/12/2023 1434   CO2 25 08/12/2023 1434   GLUCOSE 93 08/12/2023 1434   GLUCOSE 94 07/11/2020 0834   BUN 11 08/12/2023 1434   CREATININE 0.76 08/12/2023 1434   CALCIUM  9.3 08/12/2023 1434   GFRNONAA 73 05/12/2019 0805   GFRAA 84 05/12/2019 0805   Lab Results  Component Value Date   HGBA1C 6.1 (H) 08/12/2023   HGBA1C 5.6 06/12/2018   Lab Results  Component Value Date   INSULIN  8.9 08/12/2023   INSULIN  8.5 06/12/2018   Lab Results  Component Value Date   TSH 2.300 01/02/2023   CBC    Component Value Date/Time   WBC 4.3 03/05/2022 1017   WBC 4.3 07/11/2020 0834   RBC 4.78 03/05/2022 1017   RBC 4.76 07/11/2020 0834   HGB 13.5 03/05/2022 1017   HCT 41.9 03/05/2022 1017   PLT 236 03/05/2022 1017   MCV 88 03/05/2022 1017   MCH 28.2 03/05/2022 1017   MCHC 32.2 03/05/2022 1017   MCHC 33.1 07/11/2020 0834   RDW 12.8 03/05/2022 1017   Iron Studies  No results found for: "IRON", "TIBC", "FERRITIN", "IRONPCTSAT" Lipid Panel     Component Value Date/Time   CHOL 210 (H) 08/12/2023 1434   TRIG 42 08/12/2023 1434   HDL 99 08/12/2023 1434   CHOLHDL 2 07/11/2020 0834   VLDL 9.2 07/11/2020 0834   LDLCALC 103 (H) 08/12/2023 1434   LDLDIRECT 158.6 08/18/2013 0803   Hepatic Function Panel     Component Value Date/Time   PROT 7.3 08/12/2023 1434   ALBUMIN 4.4 08/12/2023 1434   AST 24 08/12/2023 1434   ALT 25 08/12/2023 1434   ALKPHOS 67 08/12/2023 1434   BILITOT 0.3 08/12/2023 1434   BILIDIR 0.0 08/10/2019 1608      Component Value Date/Time   TSH  2.300 01/02/2023 1330   Nutritional Lab Results  Component Value Date   VD25OH 70.5 08/12/2023   VD25OH 56.3 01/02/2023   VD25OH 56.2 07/17/2022     Assessment and Plan    Prediabetes Hemoglobin A1c has increased over the past year, currently at 6.1%. On metformin  and focusing on diet, exercise, and weight loss. Discussed the importance of lifestyle changes to prevent diabetes progression and the relationship between insulin , blood sugar control, hunger, and fat storage. Prefers lifestyle management over additional medication. - Continue metformin  - Commit to 5 minutes of exercise 5 days a week - Reassess hemoglobin A1c at next visit  Obesity Gained 3 pounds since the last visit. Following a category two eating plan 50% of the time but not currently exercising. Discussed the importance of routine meal preparation and exercise. Prefers to avoid additional medications by improving lifestyle habits. - Commit to 5 minutes of exercise 5 days a week - Continue category two eating plan - Reassess weight at next visit  Emotional Eating Behaviors On Wellbutrin  for emotional eating behaviors and is well-managed. Discussed the impact of grief and emotional stress on eating habits. Prefers to maintain current medication regimen. - Continue Wellbutrin  200 mg - Reassess emotional eating behaviors at next visit  Hypertension Blood pressure is well-controlled at 130/86 mmHg. On chlorthalidone  and focusing on diet, exercise, and weight loss to manage blood pressure and reduce cardiovascular risk. Discussed the importance of lifestyle modifications in reducing heart attack and stroke risk. - Continue chlorthalidone  - Continue lifestyle modifications (diet, exercise, weight loss)  General Health Maintenance Reviewed recent lab results: electrolytes, kidney function, liver function, and vitamin D  levels are within normal limits. B12 is elevated but not concerning. Cholesterol levels: HDL is high, LDL  is slightly above target but not concerning due to high HDL. Discussed the importance of maintaining good cholesterol levels and the impact of genetics. - Continue current supplements - Reassess cholesterol levels at next visit  Follow-up - Schedule next follow-up appointment after February visit.        She was informed of the importance of frequent follow up visits to maximize her success with intensive lifestyle modifications for her multiple health conditions.    Jasmine Mesi, MD

## 2023-09-27 LAB — HM DEXA SCAN: HM Dexa Scan: NORMAL

## 2023-09-30 ENCOUNTER — Encounter: Payer: Self-pay | Admitting: Primary Care

## 2023-10-08 LAB — HM DIABETES EYE EXAM

## 2023-10-11 ENCOUNTER — Other Ambulatory Visit: Payer: Self-pay | Admitting: Primary Care

## 2023-10-11 DIAGNOSIS — E785 Hyperlipidemia, unspecified: Secondary | ICD-10-CM

## 2023-10-11 MED ORDER — ATORVASTATIN CALCIUM 20 MG PO TABS: 20.0000 mg | ORAL_TABLET | Freq: Every evening | ORAL | 2 refills | Status: DC

## 2023-10-15 ENCOUNTER — Encounter (INDEPENDENT_AMBULATORY_CARE_PROVIDER_SITE_OTHER): Payer: Self-pay | Admitting: Family Medicine

## 2023-10-15 ENCOUNTER — Ambulatory Visit (INDEPENDENT_AMBULATORY_CARE_PROVIDER_SITE_OTHER): Payer: Medicare Other | Admitting: Family Medicine

## 2023-10-15 VITALS — BP 110/75 | HR 90 | Temp 98.0°F | Ht 66.0 in | Wt 205.0 lb

## 2023-10-15 DIAGNOSIS — R7303 Prediabetes: Secondary | ICD-10-CM

## 2023-10-15 DIAGNOSIS — F5089 Other specified eating disorder: Secondary | ICD-10-CM | POA: Diagnosis not present

## 2023-10-15 DIAGNOSIS — F3289 Other specified depressive episodes: Secondary | ICD-10-CM

## 2023-10-15 DIAGNOSIS — E669 Obesity, unspecified: Secondary | ICD-10-CM

## 2023-10-15 DIAGNOSIS — I10 Essential (primary) hypertension: Secondary | ICD-10-CM

## 2023-10-15 DIAGNOSIS — Z6833 Body mass index (BMI) 33.0-33.9, adult: Secondary | ICD-10-CM

## 2023-10-15 MED ORDER — METFORMIN HCL 500 MG PO TABS: 500.0000 mg | ORAL_TABLET | Freq: Two times a day (BID) | ORAL | 0 refills | Status: DC

## 2023-10-15 MED ORDER — CHLORTHALIDONE 25 MG PO TABS: 25.0000 mg | ORAL_TABLET | Freq: Every morning | ORAL | 0 refills | Status: DC

## 2023-10-15 MED ORDER — BUPROPION HCL ER (SR) 200 MG PO TB12: 200.0000 mg | ORAL_TABLET | Freq: Every morning | ORAL | 0 refills | Status: DC

## 2023-10-15 NOTE — Progress Notes (Signed)
 .smr  Office: [phone]  /  Fax: [phone]  WEIGHT SUMMARY AND BIOMETRICS  Anthropometric Measurements Height: 5\' 6"  (1.676 m) Weight: 205 lb (93 kg) BMI (Calculated): 33.1 Weight at Last Visit: 209 lb Weight Lost Since Last Visit: 4 lb Weight Gained Since Last Visit: 0 Starting Weight: 223 lb Total Weight Loss (lbs): 18 lb (8.165 kg) Peak Weight: 230 lb   Body Composition  Body Fat %: 42.7 % Fat Mass (lbs): 87.8 lbs Muscle Mass (lbs): 111.8 lbs Total Body Water (lbs): 72.8 lbs Visceral Fat Rating : 12   Other Clinical Data Fasting: no Labs: no Today's Visit #: 36 Starting Date: 12/28/20    Chief Complaint: OBESITY    History of Present Illness   Jillian [name] is a 68 year old female who presents for a follow-up on her weight loss treatment plan.  She has successfully lost four pounds over the past five weeks. She adheres to the category two eating plan approximately seventy-five percent of the time, focusing on avoiding sweets and breads due to blurred vision when her blood sugar is elevated.  She has developed a personal exercise routine that includes a ten-minute workout and a mile on the elliptical, which she completes in five to ten minutes. She engages in physical activity for twenty minutes, six times per week, and feels a positive difference from this routine, remaining committed to maintaining it.  She is currently taking Wellbutrin, metformin, and chlorthalidone. No gastrointestinal upset with metformin. Her blood pressure is well-controlled at 110/75.  She has a family history of diabetes-related eye problems.          PHYSICAL EXAM:  Blood pressure 110/75, pulse 90, temperature 98 F (36.7 C), height 5\' 6"  (1.676 m), weight 205 lb (93 kg), SpO2 99%. Body mass index is 33.09 kg/m.  DIAGNOSTIC DATA REVIEWED:  BMET    Component Value Date/Time   NA 142 08/12/2023 1434   K 3.8 08/12/2023 1434   CL 101 08/12/2023 1434   CO2 25  08/12/2023 1434   GLUCOSE 93 08/12/2023 1434   GLUCOSE 94 07/11/2020 0834   BUN 11 08/12/2023 1434   CREATININE 0.76 08/12/2023 1434   CALCIUM 9.3 08/12/2023 1434   GFRNONAA 73 05/12/2019 0805   GFRAA 84 05/12/2019 0805   Lab Results  Component Value Date   HGBA1C 6.1 (H) 08/12/2023   HGBA1C 5.6 06/12/2018   Lab Results  Component Value Date   INSULIN 8.9 08/12/2023   INSULIN 8.5 06/12/2018   Lab Results  Component Value Date   TSH 2.300 01/02/2023   CBC    Component Value Date/Time   WBC 4.3 03/05/2022 1017   WBC 4.3 07/11/2020 0834   RBC 4.78 03/05/2022 1017   RBC 4.76 07/11/2020 0834   HGB 13.5 03/05/2022 1017   HCT 41.9 03/05/2022 1017   PLT 236 03/05/2022 1017   MCV 88 03/05/2022 1017   MCH 28.2 03/05/2022 1017   MCHC 32.2 03/05/2022 1017   MCHC 33.1 07/11/2020 0834   RDW 12.8 03/05/2022 1017   Iron Studies No results found for: "IRON", "TIBC", "FERRITIN", "IRONPCTSAT" Lipid Panel     Component Value Date/Time   CHOL 210 (H) 08/12/2023 1434   TRIG 42 08/12/2023 1434   HDL 99 08/12/2023 1434   CHOLHDL 2 07/11/2020 0834   VLDL 9.2 07/11/2020 0834   LDLCALC 103 (H) 08/12/2023 1434   LDLDIRECT 158.6 08/18/2013 0803   Hepatic Function Panel     Component Value Date/Time  PROT 7.3 08/12/2023 1434   ALBUMIN 4.4 08/12/2023 1434   AST 24 08/12/2023 1434   ALT 25 08/12/2023 1434   ALKPHOS 67 08/12/2023 1434   BILITOT 0.3 08/12/2023 1434   BILIDIR 0.0 08/10/2019 1608      Component Value Date/Time   TSH 2.300 01/02/2023 1330   Nutritional Lab Results  Component Value Date   VD25OH 70.5 08/12/2023   VD25OH 56.3 01/02/2023   VD25OH 56.2 07/17/2022     Assessment and Plan    Obesity She has been following the category two eating plan 75% of the time and exercising 20 minutes six times per week. She has lost four pounds in the last five weeks. She is making good progress with her weight loss goals despite seasonal and emotional challenges.  Discussed the importance of continued adherence to the diet and exercise regimen to achieve sustained weight loss and improve overall health outcomes. - Continue current Category 2 diet plan and exercise regimen - Encourage adherence to the category two eating plan - Reinforce the importance of regular physical activity  Prediabetes She is on metformin without gastrointestinal upset. She is mindful of her diet, particularly sweets and breads, and has noticed an improvement in vision blurriness when avoiding high-sugar foods. Discussed the importance of dietary modifications to prevent progression to diabetes and protect organ function. - Refill metformin - Monitor blood glucose levels - Encourage dietary modifications to avoid high-sugar foods  Hypertension Blood pressure is well-controlled at 110/75 mmHg on chlorthalidone. Discussed the benefits of maintaining controlled blood pressure to reduce the risk of cardiovascular events. - Refill chlorthalidone  Emotional Eating Behaviors She is on Wellbutrin and managing well despite seasonal emotional challenges. Discussed the importance of continuing medication to manage depressive symptoms and improve quality of life. - Refill Wellbutrin -Work on recognizing EEB and plan for healthier snack options when she does indulge  General Health Maintenance She is hydrating well and maintaining a good exercise routine. - Follow-up appointment in April 2025.       She was informed of the importance of frequent follow up visits to maximize her success with intensive lifestyle modifications for her multiple health conditions.    Quillian Quince, MD

## 2023-11-12 ENCOUNTER — Encounter (INDEPENDENT_AMBULATORY_CARE_PROVIDER_SITE_OTHER): Payer: Self-pay | Admitting: Family Medicine

## 2023-11-12 ENCOUNTER — Ambulatory Visit (INDEPENDENT_AMBULATORY_CARE_PROVIDER_SITE_OTHER): Payer: Medicare Other | Admitting: Family Medicine

## 2023-11-12 VITALS — BP 134/84 | HR 89 | Temp 98.2°F | Ht 66.0 in | Wt 207.0 lb

## 2023-11-12 DIAGNOSIS — Z6833 Body mass index (BMI) 33.0-33.9, adult: Secondary | ICD-10-CM | POA: Diagnosis not present

## 2023-11-12 DIAGNOSIS — E785 Hyperlipidemia, unspecified: Secondary | ICD-10-CM

## 2023-11-12 DIAGNOSIS — E669 Obesity, unspecified: Secondary | ICD-10-CM

## 2023-11-12 DIAGNOSIS — E7849 Other hyperlipidemia: Secondary | ICD-10-CM

## 2023-11-12 DIAGNOSIS — R7303 Prediabetes: Secondary | ICD-10-CM | POA: Diagnosis not present

## 2023-11-12 NOTE — Progress Notes (Signed)
 Office: [phone]  /  Fax: [phone]  WEIGHT SUMMARY AND BIOMETRICS  Anthropometric Measurements Height: 5\' 6"  (1.676 m) Weight: 207 lb (93.9 kg) BMI (Calculated): 33.43 Weight at Last Visit: 205 lb Weight Lost Since Last Visit: 0 Weight Gained Since Last Visit: 2 lb Starting Weight: 223 lb Total Weight Loss (lbs): 16 lb (7.258 kg) Peak Weight: 230 lb   Body Composition  Body Fat %: 43.1 % Fat Mass (lbs): 89.4 lbs Muscle Mass (lbs): 112 lbs Total Body Water (lbs): 74.4 lbs Visceral Fat Rating : 13   Other Clinical Data Fasting: No Labs: No Today's Visit #: 37 Starting Date: 12/28/20    Chief Complaint: OBESITY   History of Present Illness Jillian [name] is a 68 year old female who presents for obesity treatment plan assessment and progress evaluation.  She has been following the category two eating plan about sixty percent of the time. Despite her efforts, she has gained two pounds in the last month since her last visit. She engages in a variety of exercises for about five minutes three to four times a week. She notes that when she is 'very conscious about it, I always gain weight.'  She has hyperlipidemia and is currently taking Bidil 20 mg. Additionally, she is trying to decrease the cholesterol in her diet and is taking omega-3 fatty acid supplements, 1000 mg daily. She is also on Lipitor and CoQ10. She mentions that her muscle mass is 'continuing to go up just a little bit more.'  She is on metformin and reports no gastrointestinal issues such as nausea or bloating, although she mentions experiencing constipation.  Her social history includes gardening, which she incorporates as part of her physical activity. She enjoys growing various plants and vegetables, which she finds beneficial for her diet and lifestyle. She mentions that her husband needs to adjust his diet as well, as he has been consuming a lot of nuts and dark chocolate, which may be  affecting his health.      PHYSICAL EXAM:  Blood pressure 134/84, pulse 89, temperature 98.2 F (36.8 C), height 5\' 6"  (1.676 m), weight 207 lb (93.9 kg), SpO2 97%. Body mass index is 33.41 kg/m.  DIAGNOSTIC DATA REVIEWED:  BMET    Component Value Date/Time   NA 142 08/12/2023 1434   K 3.8 08/12/2023 1434   CL 101 08/12/2023 1434   CO2 25 08/12/2023 1434   GLUCOSE 93 08/12/2023 1434   GLUCOSE 94 07/11/2020 0834   BUN 11 08/12/2023 1434   CREATININE 0.76 08/12/2023 1434   CALCIUM 9.3 08/12/2023 1434   GFRNONAA 73 05/12/2019 0805   GFRAA 84 05/12/2019 0805   Lab Results  Component Value Date   HGBA1C 6.1 (H) 08/12/2023   HGBA1C 5.6 06/12/2018   Lab Results  Component Value Date   INSULIN 8.9 08/12/2023   INSULIN 8.5 06/12/2018   Lab Results  Component Value Date   TSH 2.300 01/02/2023   CBC    Component Value Date/Time   WBC 4.3 03/05/2022 1017   WBC 4.3 07/11/2020 0834   RBC 4.78 03/05/2022 1017   RBC 4.76 07/11/2020 0834   HGB 13.5 03/05/2022 1017   HCT 41.9 03/05/2022 1017   PLT 236 03/05/2022 1017   MCV 88 03/05/2022 1017   MCH 28.2 03/05/2022 1017   MCHC 32.2 03/05/2022 1017   MCHC 33.1 07/11/2020 0834   RDW 12.8 03/05/2022 1017   Iron Studies No results found for: "IRON", "TIBC", "FERRITIN", "IRONPCTSAT" Lipid  Panel     Component Value Date/Time   CHOL 210 (H) 08/12/2023 1434   TRIG 42 08/12/2023 1434   HDL 99 08/12/2023 1434   CHOLHDL 2 07/11/2020 0834   VLDL 9.2 07/11/2020 0834   LDLCALC 103 (H) 08/12/2023 1434   LDLDIRECT 158.6 08/18/2013 0803   Hepatic Function Panel     Component Value Date/Time   PROT 7.3 08/12/2023 1434   ALBUMIN 4.4 08/12/2023 1434   AST 24 08/12/2023 1434   ALT 25 08/12/2023 1434   ALKPHOS 67 08/12/2023 1434   BILITOT 0.3 08/12/2023 1434   BILIDIR 0.0 08/10/2019 1608      Component Value Date/Time   TSH 2.300 01/02/2023 1330   Nutritional Lab Results  Component Value Date   VD25OH 70.5 08/12/2023    VD25OH 56.3 01/02/2023   VD25OH 56.2 07/17/2022     Assessment and Plan Assessment & Plan Obesity She follows the category two eating plan approximately 60% of the time and has gained two pounds in the last month. The weight gain is attributed to water retention and increased muscle mass, which is a positive outcome. She engages in various exercises for about five minutes three to four times a week, and gardening contributes to her physical activity. - Continue category two eating plan - Increase exercise duration and frequency - Encourage continued gardening and physical activity  Hyperlipidemia She is on Lipitor 20 mg and omega-3 fatty acid supplements (1000 mg daily) and is attempting to decrease dietary cholesterol. The current regimen is effective. - Continue Lipitor 20 mg - Continue omega-3 fatty acid supplements 1000 mg daily - Continue dietary cholesterol reduction  Prediabetes She is mindful of her diet and exercise, which are important for her overall health. She is on Lipitor, CoQ10, and omega-3s. Her hemoglobin A1c will be monitored in future labs. She is stable on metformin to prevent DM - Monitor dietary habits and encourage healthy eating - Ensure hemoglobin A1c is checked in future labs -Continue metformin      She was informed of the importance of frequent follow up visits to maximize her success with intensive lifestyle modifications for her multiple health conditions.    Jillian Quince, MD

## 2023-12-16 ENCOUNTER — Encounter (INDEPENDENT_AMBULATORY_CARE_PROVIDER_SITE_OTHER): Payer: Self-pay | Admitting: Family Medicine

## 2023-12-16 ENCOUNTER — Ambulatory Visit (INDEPENDENT_AMBULATORY_CARE_PROVIDER_SITE_OTHER): Payer: Medicare Other | Admitting: Family Medicine

## 2023-12-16 VITALS — BP 112/74 | HR 71 | Temp 97.7°F | Ht 66.0 in | Wt 207.0 lb

## 2023-12-16 DIAGNOSIS — F3289 Other specified depressive episodes: Secondary | ICD-10-CM

## 2023-12-16 DIAGNOSIS — R7303 Prediabetes: Secondary | ICD-10-CM

## 2023-12-16 DIAGNOSIS — Z6833 Body mass index (BMI) 33.0-33.9, adult: Secondary | ICD-10-CM | POA: Diagnosis not present

## 2023-12-16 DIAGNOSIS — E669 Obesity, unspecified: Secondary | ICD-10-CM | POA: Diagnosis not present

## 2023-12-16 DIAGNOSIS — F5089 Other specified eating disorder: Secondary | ICD-10-CM

## 2023-12-16 MED ORDER — BUPROPION HCL ER (SR) 200 MG PO TB12
200.0000 mg | ORAL_TABLET | Freq: Every morning | ORAL | 0 refills | Status: DC
Start: 2023-12-16 — End: 2024-03-24

## 2023-12-16 NOTE — Progress Notes (Signed)
 Office: [phone]  /  Fax: [phone]  WEIGHT SUMMARY AND BIOMETRICS  Anthropometric Measurements Height: 5\' 6"  (1.676 m) Weight: 207 lb (93.9 kg) BMI (Calculated): 33.43 Weight at Last Visit: 207lb Weight Lost Since Last Visit: 0 Weight Gained Since Last Visit: 0 Starting Weight: 223lb Total Weight Loss (lbs): 16 lb (7.258 kg) Peak Weight: 230lb   Body Composition  Body Fat %: 43.1 % Fat Mass (lbs): 89.4 lbs Muscle Mass (lbs): 112 lbs Total Body Water (lbs): 75.4 lbs Visceral Fat Rating : 13   Other Clinical Data Fasting: no Labs: no Today's Visit #: 38 Starting Date: 12/28/20    Chief Complaint: OBESITY    History of Present Illness Jillian [name] is a 68 year old female who presents for obesity treatment and progress assessment.  She is on a category two eating plan, adhering to it approximately 65% of the time, but has maintained her weight over the past month. Her daily meals include breakfast, a piece of fruit for lunch, and supper, with her last meal around 6 PM. She experiences hunger after the evening news and snacks on fruits such as sumo oranges, cantaloupe, and apples, as well as vegetables like cucumbers, peppers, and purple onions. She acknowledges possibly consuming too many fruits.  She has not engaged in formal exercise but reports increased physical activity through yard work during the spring. She is planning to travel to the beach next week and has upcoming family events, including graduations and get-togethers, which may challenge her eating plan. She has previously managed to make good food choices during events, such as a recent funeral.  She is currently taking Wellbutrin  and metformin . She anticipates needing a refill of Wellbutrin  by her next visit, while her metformin  supply is sufficient until the end of June.      PHYSICAL EXAM:  Blood pressure 112/74, pulse 71, temperature 97.7 F (36.5 C), height 5\' 6"  (1.676 m),  weight 207 lb (93.9 kg), SpO2 95%. Body mass index is 33.41 kg/m.  DIAGNOSTIC DATA REVIEWED:  BMET    Component Value Date/Time   NA 142 08/12/2023 1434   K 3.8 08/12/2023 1434   CL 101 08/12/2023 1434   CO2 25 08/12/2023 1434   GLUCOSE 93 08/12/2023 1434   GLUCOSE 94 07/11/2020 0834   BUN 11 08/12/2023 1434   CREATININE 0.76 08/12/2023 1434   CALCIUM  9.3 08/12/2023 1434   GFRNONAA 73 05/12/2019 0805   GFRAA 84 05/12/2019 0805   Lab Results  Component Value Date   HGBA1C 6.1 (H) 08/12/2023   HGBA1C 5.6 06/12/2018   Lab Results  Component Value Date   INSULIN  8.9 08/12/2023   INSULIN  8.5 06/12/2018   Lab Results  Component Value Date   TSH 2.300 01/02/2023   CBC    Component Value Date/Time   WBC 4.3 03/05/2022 1017   WBC 4.3 07/11/2020 0834   RBC 4.78 03/05/2022 1017   RBC 4.76 07/11/2020 0834   HGB 13.5 03/05/2022 1017   HCT 41.9 03/05/2022 1017   PLT 236 03/05/2022 1017   MCV 88 03/05/2022 1017   MCH 28.2 03/05/2022 1017   MCHC 32.2 03/05/2022 1017   MCHC 33.1 07/11/2020 0834   RDW 12.8 03/05/2022 1017   Iron Studies No results found for: "IRON", "TIBC", "FERRITIN", "IRONPCTSAT" Lipid Panel     Component Value Date/Time   CHOL 210 (H) 08/12/2023 1434   TRIG 42 08/12/2023 1434   HDL 99 08/12/2023 1434   CHOLHDL 2 07/11/2020 0834  VLDL 9.2 07/11/2020 0834   LDLCALC 103 (H) 08/12/2023 1434   LDLDIRECT 158.6 08/18/2013 0803   Hepatic Function Panel     Component Value Date/Time   PROT 7.3 08/12/2023 1434   ALBUMIN 4.4 08/12/2023 1434   AST 24 08/12/2023 1434   ALT 25 08/12/2023 1434   ALKPHOS 67 08/12/2023 1434   BILITOT 0.3 08/12/2023 1434   BILIDIR 0.0 08/10/2019 1608      Component Value Date/Time   TSH 2.300 01/02/2023 1330   Nutritional Lab Results  Component Value Date   VD25OH 70.5 08/12/2023   VD25OH 56.3 01/02/2023   VD25OH 56.2 07/17/2022     Assessment and Plan Assessment & Plan Obesity Obesity management is ongoing  with a category two eating plan, adhered to approximately 65% of the time. Increased physical activity through yard work is noted, and weight has been maintained over the last month. She is aware of the need for healthier food choices, particularly regarding evening snacks. - Continue category two eating plan - Encourage increased adherence to the eating plan - Promote continued physical activity through yard work - Discuss strategies for healthier food choices, especially for evening snacks  Emotional eating behaviors Emotional eating behaviors are addressed as part of the obesity management plan. She is actively working on [employer]. - Refill Wellbutrin  prescription  Prediabetes Prediabetes is managed with metformin  and lifestyle modifications. A1c was slightly elevated during the last check around the holidays. Plan to recheck labs by June to monitor A1c levels. - Continue metformin  - Recheck labs, including A1c, by June    She was informed of the importance of frequent follow up visits to maximize her success with intensive lifestyle modifications for her multiple health conditions.    Jasmine Mesi, MD

## 2024-01-15 ENCOUNTER — Ambulatory Visit (INDEPENDENT_AMBULATORY_CARE_PROVIDER_SITE_OTHER): Admitting: Family Medicine

## 2024-01-15 ENCOUNTER — Encounter (INDEPENDENT_AMBULATORY_CARE_PROVIDER_SITE_OTHER): Payer: Self-pay | Admitting: Family Medicine

## 2024-01-15 VITALS — BP 108/73 | HR 84 | Temp 99.0°F | Ht 66.0 in | Wt 205.0 lb

## 2024-01-15 DIAGNOSIS — E669 Obesity, unspecified: Secondary | ICD-10-CM

## 2024-01-15 DIAGNOSIS — I10 Essential (primary) hypertension: Secondary | ICD-10-CM

## 2024-01-15 DIAGNOSIS — Z6833 Body mass index (BMI) 33.0-33.9, adult: Secondary | ICD-10-CM | POA: Diagnosis not present

## 2024-01-15 DIAGNOSIS — E66812 Obesity, class 2: Secondary | ICD-10-CM

## 2024-01-15 DIAGNOSIS — R7303 Prediabetes: Secondary | ICD-10-CM | POA: Diagnosis not present

## 2024-01-15 MED ORDER — CHLORTHALIDONE 15 MG PO TABS: 15.0000 mg | ORAL_TABLET | Freq: Every day | ORAL | 0 refills | Status: DC

## 2024-01-15 MED ORDER — METFORMIN HCL 500 MG PO TABS
500.0000 mg | ORAL_TABLET | Freq: Two times a day (BID) | ORAL | 0 refills | Status: DC
Start: 2024-01-15 — End: 2024-03-24

## 2024-01-15 NOTE — Progress Notes (Signed)
 Office: [phone]  /  Fax: [phone]  WEIGHT SUMMARY AND BIOMETRICS  Anthropometric Measurements Height: 5\' 6"  (1.676 m) Weight: 205 lb (93 kg) BMI (Calculated): 33.1 Weight at Last Visit: 207 lb Weight Lost Since Last Visit: 2 lb Weight Gained Since Last Visit: 0 Starting Weight: 223 lb Total Weight Loss (lbs): 18 lb (8.165 kg) Peak Weight: 230 lb   Body Composition  Body Fat %: 42.4 % Fat Mass (lbs): 87 lbs Muscle Mass (lbs): 112 lbs Total Body Water (lbs): 74.2 lbs Visceral Fat Rating : 13   Other Clinical Data Fasting: no Labs: no Today's Visit #: 39 Starting Date: 12/28/20    Chief Complaint: OBESITY    History of Present Illness Jillian [name] is a 68 year old female with obesity and prediabetes who presents for a follow-up on her treatment progress.  She is managing her obesity by adhering to a category two eating plan approximately 65% of the time and engaging in yard work for 30 to 60 minutes, five times a week. She has lost two pounds in the last month.  For her prediabetes, she is decreasing simple carbohydrates and increasing exercise and weight loss. She is taking metformin  500 mg twice daily, with mild gastrointestinal discomfort when consuming too many carbohydrates but no significant side effects otherwise.  She has a history of hypertension and is focusing on weight loss, exercise, and reducing sodium intake. She is taking chlorthalidone  25 mg daily with no symptoms of low blood pressure such as lightheadedness or dizziness.  Her social history includes recent travel and family events that have disrupted her routine, though she has been mindful of her diet during these events. She engages in gardening and yard work, which she finds therapeutic and beneficial for her physical activity.      PHYSICAL EXAM:  Blood pressure 108/73, pulse 84, temperature 99 F (37.2 C), height 5\' 6"  (1.676 m), weight 205 lb (93 kg), SpO2 99%. Body  mass index is 33.09 kg/m.  DIAGNOSTIC DATA REVIEWED:  BMET    Component Value Date/Time   NA 142 08/12/2023 1434   K 3.8 08/12/2023 1434   CL 101 08/12/2023 1434   CO2 25 08/12/2023 1434   GLUCOSE 93 08/12/2023 1434   GLUCOSE 94 07/11/2020 0834   BUN 11 08/12/2023 1434   CREATININE 0.76 08/12/2023 1434   CALCIUM  9.3 08/12/2023 1434   GFRNONAA 73 05/12/2019 0805   GFRAA 84 05/12/2019 0805   Lab Results  Component Value Date   HGBA1C 6.1 (H) 08/12/2023   HGBA1C 5.6 06/12/2018   Lab Results  Component Value Date   INSULIN  8.9 08/12/2023   INSULIN  8.5 06/12/2018   Lab Results  Component Value Date   TSH 2.300 01/02/2023   CBC    Component Value Date/Time   WBC 4.3 03/05/2022 1017   WBC 4.3 07/11/2020 0834   RBC 4.78 03/05/2022 1017   RBC 4.76 07/11/2020 0834   HGB 13.5 03/05/2022 1017   HCT 41.9 03/05/2022 1017   PLT 236 03/05/2022 1017   MCV 88 03/05/2022 1017   MCH 28.2 03/05/2022 1017   MCHC 32.2 03/05/2022 1017   MCHC 33.1 07/11/2020 0834   RDW 12.8 03/05/2022 1017   Iron Studies No results found for: "IRON", "TIBC", "FERRITIN", "IRONPCTSAT" Lipid Panel     Component Value Date/Time   CHOL 210 (H) 08/12/2023 1434   TRIG 42 08/12/2023 1434   HDL 99 08/12/2023 1434   CHOLHDL 2 07/11/2020 0834  VLDL 9.2 07/11/2020 0834   LDLCALC 103 (H) 08/12/2023 1434   LDLDIRECT 158.6 08/18/2013 0803   Hepatic Function Panel     Component Value Date/Time   PROT 7.3 08/12/2023 1434   ALBUMIN 4.4 08/12/2023 1434   AST 24 08/12/2023 1434   ALT 25 08/12/2023 1434   ALKPHOS 67 08/12/2023 1434   BILITOT 0.3 08/12/2023 1434   BILIDIR 0.0 08/10/2019 1608      Component Value Date/Time   TSH 2.300 01/02/2023 1330   Nutritional Lab Results  Component Value Date   VD25OH 70.5 08/12/2023   VD25OH 56.3 01/02/2023   VD25OH 56.2 07/17/2022     Assessment and Plan Assessment & Plan Obesity Obesity management with a category two eating plan, adherence  approximately 65%. Engaging in physical activity through yard work for 30-60 minutes, five times a week. Weight loss of two pounds in the last month. No changes to the eating plan are necessary at this time. - Continue category two eating plan - Continue physical activity through yard work  Prediabetes Prediabetes management with lifestyle modifications including decreased simple carbohydrates and increased exercise. Currently on metformin  500 mg bid with no significant side effects. Potential gastrointestinal issues noted with high carbohydrate intake. - Continue metformin  500 mg bid - Continue lifestyle modifications including decreased simple carbohydrates and increased exercise - Monitor for gastrointestinal issues related to carbohydrate intake  Hypertension Hypertension well-controlled with current blood pressure at 108/73 mmHg. On chlorthalidone  25 mg daily. Considering dose reduction due to well-controlled blood pressure and ongoing weight loss efforts. No symptoms of hypotension reported. Decision made to reduce chlorthalidone  to 15 mg daily with monitoring for any increase in blood pressure. - Reduce chlorthalidone  to 15 mg daily - Monitor blood pressure for any increase - Ensure adequate hydration to prevent dehydration    She was informed of the importance of frequent follow up visits to maximize her success with intensive lifestyle modifications for her multiple health conditions.    Jasmine Mesi, MD

## 2024-01-22 ENCOUNTER — Encounter (INDEPENDENT_AMBULATORY_CARE_PROVIDER_SITE_OTHER): Payer: Self-pay | Admitting: Family Medicine

## 2024-02-03 ENCOUNTER — Other Ambulatory Visit: Payer: Self-pay | Admitting: Surgical

## 2024-02-03 DIAGNOSIS — M25572 Pain in left ankle and joints of left foot: Secondary | ICD-10-CM

## 2024-02-17 ENCOUNTER — Encounter (INDEPENDENT_AMBULATORY_CARE_PROVIDER_SITE_OTHER): Payer: Self-pay | Admitting: Family Medicine

## 2024-02-17 ENCOUNTER — Ambulatory Visit (INDEPENDENT_AMBULATORY_CARE_PROVIDER_SITE_OTHER): Admitting: Family Medicine

## 2024-02-17 VITALS — BP 115/78 | HR 80 | Temp 98.0°F | Ht 66.0 in | Wt 205.0 lb

## 2024-02-17 DIAGNOSIS — Z6833 Body mass index (BMI) 33.0-33.9, adult: Secondary | ICD-10-CM

## 2024-02-17 DIAGNOSIS — E7849 Other hyperlipidemia: Secondary | ICD-10-CM

## 2024-02-17 DIAGNOSIS — I10 Essential (primary) hypertension: Secondary | ICD-10-CM | POA: Diagnosis not present

## 2024-02-17 DIAGNOSIS — E785 Hyperlipidemia, unspecified: Secondary | ICD-10-CM

## 2024-02-17 DIAGNOSIS — R7303 Prediabetes: Secondary | ICD-10-CM

## 2024-02-17 DIAGNOSIS — E669 Obesity, unspecified: Secondary | ICD-10-CM

## 2024-02-17 DIAGNOSIS — E559 Vitamin D deficiency, unspecified: Secondary | ICD-10-CM

## 2024-02-17 MED ORDER — VITAMIN D (ERGOCALCIFEROL) 1.25 MG (50000 UNIT) PO CAPS: 50000.0000 [IU] | ORAL_CAPSULE | ORAL | 0 refills | Status: DC

## 2024-02-17 MED ORDER — HYDROCHLOROTHIAZIDE 12.5 MG PO TABS: 12.5000 mg | ORAL_TABLET | Freq: Every day | ORAL | 0 refills | Status: DC

## 2024-02-17 NOTE — Progress Notes (Signed)
 Office: [phone]  /  Fax: [phone]  WEIGHT SUMMARY AND BIOMETRICS  Anthropometric Measurements Height: 5' 6 (1.676 m) Weight: 205 lb (93 kg) BMI (Calculated): 33.1 Weight at Last Visit: 205 lb Weight Lost Since Last Visit: 0 Weight Gained Since Last Visit: 0 Starting Weight: 223 lb Total Weight Loss (lbs): 18 lb (8.165 kg) Peak Weight: 230 lb   Body Composition  Body Fat %: 43 % Fat Mass (lbs): 88.4 lbs Muscle Mass (lbs): 111.2 lbs Total Body Water (lbs): 76.2 lbs Visceral Fat Rating : 13   Other Clinical Data Fasting: No Labs: No Today's Visit #: 40 Starting Date: 12/28/20    Chief Complaint: OBESITY     History of Present Illness Jillian [name] is a 68 year old female who presents for a follow-up on her obesity treatment and progress.  She is adhering to the prescribed category two eating plan approximately 65% of the time and maintains an active lifestyle, engaging in walking and gardening for about 30 minutes on most days of the week. Despite these efforts, her weight has remained stable over the past month since her last visit.  She is currently taking chlorthalidone  for hypertension management but has difficulty obtaining a 15 mg dose and has been halving her current dose, which she finds inconvenient. She reports possible fluid retention, noting that her feet become puffy at the end of the day, especially in hot weather. She hydrates with pH water, specifically [employer], which she feels is more effective than Gatorade.  She takes vitamin D  supplements, adjusting her dose seasonally. Currently, she takes 1000 IU daily, increasing to 3000-5000 IU during the winter months. Her most recent hemoglobin A1c was 6.1, indicating prediabetes.  She mentions a planned beach trip in August and enjoys taking weekend trips when possible. She reflects on her past, noting changes in lifestyle and societal values over time.      PHYSICAL EXAM:  Blood  pressure 115/78, pulse 80, temperature 98 F (36.7 C), height 5' 6 (1.676 m), weight 205 lb (93 kg), SpO2 99%. Body mass index is 33.09 kg/m.  DIAGNOSTIC DATA REVIEWED:  BMET    Component Value Date/Time   NA 142 08/12/2023 1434   K 3.8 08/12/2023 1434   CL 101 08/12/2023 1434   CO2 25 08/12/2023 1434   GLUCOSE 93 08/12/2023 1434   GLUCOSE 94 07/11/2020 0834   BUN 11 08/12/2023 1434   CREATININE 0.76 08/12/2023 1434   CALCIUM  9.3 08/12/2023 1434   GFRNONAA 73 05/12/2019 0805   GFRAA 84 05/12/2019 0805   Lab Results  Component Value Date   HGBA1C 6.1 (H) 08/12/2023   HGBA1C 5.6 06/12/2018   Lab Results  Component Value Date   INSULIN  8.9 08/12/2023   INSULIN  8.5 06/12/2018   Lab Results  Component Value Date   TSH 2.300 01/02/2023   CBC    Component Value Date/Time   WBC 4.3 03/05/2022 1017   WBC 4.3 07/11/2020 0834   RBC 4.78 03/05/2022 1017   RBC 4.76 07/11/2020 0834   HGB 13.5 03/05/2022 1017   HCT 41.9 03/05/2022 1017   PLT 236 03/05/2022 1017   MCV 88 03/05/2022 1017   MCH 28.2 03/05/2022 1017   MCHC 32.2 03/05/2022 1017   MCHC 33.1 07/11/2020 0834   RDW 12.8 03/05/2022 1017   Iron Studies No results found for: IRON, TIBC, FERRITIN, IRONPCTSAT Lipid Panel     Component Value Date/Time   CHOL 210 (H) 08/12/2023 1434   TRIG  42 08/12/2023 1434   HDL 99 08/12/2023 1434   CHOLHDL 2 07/11/2020 0834   VLDL 9.2 07/11/2020 0834   LDLCALC 103 (H) 08/12/2023 1434   LDLDIRECT 158.6 08/18/2013 0803   Hepatic Function Panel     Component Value Date/Time   PROT 7.3 08/12/2023 1434   ALBUMIN 4.4 08/12/2023 1434   AST 24 08/12/2023 1434   ALT 25 08/12/2023 1434   ALKPHOS 67 08/12/2023 1434   BILITOT 0.3 08/12/2023 1434   BILIDIR 0.0 08/10/2019 1608      Component Value Date/Time   TSH 2.300 01/02/2023 1330   Nutritional Lab Results  Component Value Date   VD25OH 70.5 08/12/2023   VD25OH 56.3 01/02/2023   VD25OH 56.2 07/17/2022      Assessment and Plan Assessment & Plan Hypertension Blood pressure is well-controlled at 115/78 mmHg with chlorthalidone . Difficulty obtaining the 15 mg dose of chlorthalidone . Advised to switch to hydrochlorothiazide, which is in the same category and available in a 12.5 mg dose. Chlorthalidone  is more potent, but the switch should still provide adequate coverage. Advised against taking chlorthalidone  every other day due to inconsistent blood pressure control. - Switch from chlorthalidone  to hydrochlorothiazide 12.5 mg daily - Send prescription for hydrochlorothiazide to [facility]  Obesity Following the category two eating plan about 65% of the time and staying active with walking and gardening for 30 minutes most days of the week. Weight has been maintained over the last month. Fluid retention noted, likely due to increased outdoor activity in hot weather, leading to leaky blood vessels and fluid accumulation in tissues. Advised to maintain hydration to manage this condition. - Continue category two eating plan - Continue regular physical activity including walking and gardening - Maintain adequate hydration  Prediabetes Most recent hemoglobin A1c was 6.1. Plan to monitor this condition with lab tests to assess if it remains stable. - Order hemoglobin A1c and insulin  level tests - Continue category two eating plan - Continue regular physical activity including walking and gardening  Hyperlipidemia Condition is being monitored as part of routine health maintenance. Plan to check cholesterol levels during lab tests. - Continue category two eating plan - Continue regular physical activity including walking and gardening - Order lipid panel to check cholesterol levels  Vitamin D  Deficiency Takes 1000 IU of vitamin D  daily during the summer and increases to 3000-5000 IU in the winter. Plan to monitor vitamin D  levels with lab tests. - Order vitamin D  level test - Refill vitamin D   prescription        She was informed of the importance of frequent follow up visits to maximize her success with intensive lifestyle modifications for her multiple health conditions.    Louann Penton, MD

## 2024-02-18 LAB — LIPID PANEL WITH LDL/HDL RATIO
Cholesterol, Total: 189 mg/dL (ref 100–199)
HDL: 88 mg/dL (ref >39)
LDL Chol Calc (NIH): 91 mg/dL (ref 0–99)
LDL/HDL Ratio: 1 ratio (ref 0.0–3.2)
Triglycerides: 52 mg/dL (ref 0–149)
VLDL Cholesterol Cal: 10 mg/dL (ref 5–40)

## 2024-02-18 LAB — CMP14+EGFR
ALT: 19 IU/L (ref 0–32)
AST: 28 IU/L (ref 0–40)
Albumin: 4.5 g/dL (ref 3.9–4.9)
Alkaline Phosphatase: 60 IU/L (ref 44–121)
BUN/Creatinine Ratio: 18 (ref 12–28)
BUN: 17 mg/dL (ref 8–27)
Bilirubin Total: 0.5 mg/dL (ref 0.0–1.2)
CO2: 21 mmol/L (ref 20–29)
Calcium: 9.6 mg/dL (ref 8.7–10.3)
Chloride: 101 mmol/L (ref 96–106)
Creatinine, Ser: 0.95 mg/dL (ref 0.57–1.00)
Globulin, Total: 2.8 g/dL (ref 1.5–4.5)
Glucose: 89 mg/dL (ref 70–99)
Potassium: 3.8 mmol/L (ref 3.5–5.2)
Sodium: 140 mmol/L (ref 134–144)
Total Protein: 7.3 g/dL (ref 6.0–8.5)
eGFR: 65 mL/min/{1.73_m2} (ref >59)

## 2024-02-18 LAB — HEMOGLOBIN A1C
Est. average glucose Bld gHb Est-mCnc: 114 mg/dL
Hgb A1c MFr Bld: 5.6 % (ref 4.8–5.6)

## 2024-02-18 LAB — VITAMIN D 25 HYDROXY (VIT D DEFICIENCY, FRACTURES): Vit D, 25-Hydroxy: 73.3 ng/mL (ref 30.0–100.0)

## 2024-02-18 LAB — INSULIN, RANDOM: INSULIN: 7.3 u[IU]/mL (ref 2.6–24.9)

## 2024-02-18 LAB — VITAMIN B12: Vitamin B-12: 1784 pg/mL — ABNORMAL HIGH (ref 232–1245)

## 2024-03-24 ENCOUNTER — Encounter (INDEPENDENT_AMBULATORY_CARE_PROVIDER_SITE_OTHER): Payer: Self-pay | Admitting: Family Medicine

## 2024-03-24 ENCOUNTER — Ambulatory Visit (INDEPENDENT_AMBULATORY_CARE_PROVIDER_SITE_OTHER): Admitting: Family Medicine

## 2024-03-24 VITALS — BP 126/79 | HR 80 | Temp 98.3°F | Ht 66.0 in | Wt 207.0 lb

## 2024-03-24 DIAGNOSIS — Z6833 Body mass index (BMI) 33.0-33.9, adult: Secondary | ICD-10-CM

## 2024-03-24 DIAGNOSIS — I10 Essential (primary) hypertension: Secondary | ICD-10-CM

## 2024-03-24 DIAGNOSIS — E669 Obesity, unspecified: Secondary | ICD-10-CM

## 2024-03-24 DIAGNOSIS — F5089 Other specified eating disorder: Secondary | ICD-10-CM

## 2024-03-24 DIAGNOSIS — R7303 Prediabetes: Secondary | ICD-10-CM

## 2024-03-24 DIAGNOSIS — E7849 Other hyperlipidemia: Secondary | ICD-10-CM | POA: Diagnosis not present

## 2024-03-24 DIAGNOSIS — F3289 Other specified depressive episodes: Secondary | ICD-10-CM

## 2024-03-24 DIAGNOSIS — E559 Vitamin D deficiency, unspecified: Secondary | ICD-10-CM | POA: Diagnosis not present

## 2024-03-24 DIAGNOSIS — E66812 Obesity, class 2: Secondary | ICD-10-CM

## 2024-03-24 MED ORDER — BUPROPION HCL ER (SR) 200 MG PO TB12
200.0000 mg | ORAL_TABLET | Freq: Every morning | ORAL | 0 refills | Status: DC
Start: 2024-03-24 — End: 2024-06-02

## 2024-03-24 MED ORDER — HYDROCHLOROTHIAZIDE 12.5 MG PO TABS: 12.5000 mg | ORAL_TABLET | Freq: Every day | ORAL | 0 refills | Status: DC

## 2024-03-24 MED ORDER — METFORMIN HCL 500 MG PO TABS: 500.0000 mg | ORAL_TABLET | Freq: Two times a day (BID) | ORAL | 0 refills | Status: DC

## 2024-03-24 NOTE — Progress Notes (Signed)
 Office: [phone]  /  Fax: [phone]  WEIGHT SUMMARY AND BIOMETRICS  Anthropometric Measurements Height: 5' 6 (1.676 m) Weight: 207 lb (93.9 kg) BMI (Calculated): 33.43 Weight at Last Visit: 205lb Weight Lost Since Last Visit: 0lb Weight Gained Since Last Visit: 2lb Starting Weight: 223lb Total Weight Loss (lbs): 16 lb (7.258 kg) Peak Weight: 230lb   Body Composition  Body Fat %: 42.5 % Fat Mass (lbs): 88 lbs Muscle Mass (lbs): 113 lbs Total Body Water (lbs): 75.2 lbs Visceral Fat Rating : 13   Other Clinical Data Fasting: No Labs: no Today's Visit #: 41 Starting Date: 12/28/20    Chief Complaint: OBESITY   Discussed the use of AI scribe software for clinical note transcription with the patient, who gave verbal consent to proceed.  History of Present Illness Jillian [name] is a 68 year old female with obesity, prediabetes, and depression who presents for a follow-up on his obesity treatment plan and progress.  He is following the category two eating plan about two-thirds of the time, focusing on increasing lean protein, fruits, and vegetables. He occasionally skips meals and reports inadequate sleep. He exercises five to six times a week for thirty minutes, primarily through yard work. Despite these efforts, he has gained two pounds in the last five weeks.  For prediabetes, he is on metformin  and requests a refill. He experiences increased gassiness with metformin  use but no nausea or pain. He has been consuming more fruits, particularly watermelon, which may contribute to this symptom.  He is diagnosed with depression and exhibits emotional eating behaviors. He is being treated with Wellbutrin  SR 200 mg in the morning and requests a refill.  He is taking vitamin D  supplements, currently at 50,000 IU weekly, and plans to switch to 2,000 IU daily after a few weeks. His recent labs showed a vitamin D  level of 73.  He is planning a trip to a [place]  beach where he will mostly prepare his own meals, minimizing eating out.      PHYSICAL EXAM:  Blood pressure 126/79, pulse 80, temperature 98.3 F (36.8 C), height 5' 6 (1.676 m), weight 207 lb (93.9 kg), SpO2 98%. Body mass index is 33.41 kg/m.  DIAGNOSTIC DATA REVIEWED:  BMET    Component Value Date/Time   NA 140 02/17/2024 1040   K 3.8 02/17/2024 1040   CL 101 02/17/2024 1040   CO2 21 02/17/2024 1040   GLUCOSE 89 02/17/2024 1040   GLUCOSE 94 07/11/2020 0834   BUN 17 02/17/2024 1040   CREATININE 0.95 02/17/2024 1040   CALCIUM  9.6 02/17/2024 1040   GFRNONAA 73 05/12/2019 0805   GFRAA 84 05/12/2019 0805   Lab Results  Component Value Date   HGBA1C 5.6 02/17/2024   HGBA1C 5.6 06/12/2018   Lab Results  Component Value Date   INSULIN  7.3 02/17/2024   INSULIN  8.5 06/12/2018   Lab Results  Component Value Date   TSH 2.300 01/02/2023   CBC    Component Value Date/Time   WBC 4.3 03/05/2022 1017   WBC 4.3 07/11/2020 0834   RBC 4.78 03/05/2022 1017   RBC 4.76 07/11/2020 0834   HGB 13.5 03/05/2022 1017   HCT 41.9 03/05/2022 1017   PLT 236 03/05/2022 1017   MCV 88 03/05/2022 1017   MCH 28.2 03/05/2022 1017   MCHC 32.2 03/05/2022 1017   MCHC 33.1 07/11/2020 0834   RDW 12.8 03/05/2022 1017   Iron Studies No results found for: IRON, TIBC, FERRITIN, IRONPCTSAT  Lipid Panel     Component Value Date/Time   CHOL 189 02/17/2024 1040   TRIG 52 02/17/2024 1040   HDL 88 02/17/2024 1040   CHOLHDL 2 07/11/2020 0834   VLDL 9.2 07/11/2020 0834   LDLCALC 91 02/17/2024 1040   LDLDIRECT 158.6 08/18/2013 0803   Hepatic Function Panel     Component Value Date/Time   PROT 7.3 02/17/2024 1040   ALBUMIN 4.5 02/17/2024 1040   AST 28 02/17/2024 1040   ALT 19 02/17/2024 1040   ALKPHOS 60 02/17/2024 1040   BILITOT 0.5 02/17/2024 1040   BILIDIR 0.0 08/10/2019 1608      Component Value Date/Time   TSH 2.300 01/02/2023 1330   Nutritional Lab Results  Component  Value Date   VD25OH 73.3 02/17/2024   VD25OH 70.5 08/12/2023   VD25OH 56.3 01/02/2023     Assessment and Plan Assessment & Plan Emotional eating behaviors Emotional eating behaviors associated with depression, managed with Wellbutrin  SR 200 mg in the morning. No reported issues with the medication. - Refill Wellbutrin  SR 200 mg in the morning.  Obesity Obesity managed with a category two eating plan, followed about two-thirds of the time. Increased lean protein intake and fruits and vegetables. Exercises five to six times a week for 30 minutes, primarily through yard work. Gained two pounds in the last five weeks, but increase in muscle mass noted. Occasional meal skipping and inadequate sleep reported. - Continue category two eating plan. - Encourage consistent meal intake and adequate sleep. - Advise to prioritize protein intake, especially when meals are skipped.  Prediabetes Prediabetes with significant improvement in A1c from 6.1 to 5.6. Insulin  levels improved from 13 to 8.7. Managed with dietary changes, exercise, and metformin . Reports increased gassiness, possibly related to metformin  and increased fruit intake. Gassiness may indicate increased carbohydrate intake affecting metformin  efficacy. - Refill metformin . - Monitor carbohydrate intake, especially fruits, to manage gassiness. - Continue diet, exercise and weight loss as discussed today as an important part of her treatment plan   Essential hypertension Essential hypertension well-controlled with hydrochlorothiazide  12.5 mg. Current blood pressure is 126/79 mmHg. No side effects reported from the medication. - Continue hydrochlorothiazide  12.5 mg. - Monitor blood pressure regularly. - Continue diet, exercise and weight loss as discussed today as an important part of her treatment plan  Vitamin D  deficiency Vitamin D  level slightly elevated at 73, with a target range of 50-60. Currently taking 50,000 IU weekly. Plan to  reduce to 2,000 IU daily after completing the current prescription to prevent excessive levels. Excessively high vitamin D  levels can lead to elevated calcium  and muscle weakness. - Continue 50,000 IU vitamin D  weekly for 3-4 more weeks. - Switch to 2,000 IU vitamin D  daily after completing prescription. - Recheck vitamin D  levels in late fall or early winter.    She was informed of the importance of frequent follow up visits to maximize her success with intensive lifestyle modifications for her multiple health conditions.    Louann Penton, MD

## 2024-04-27 ENCOUNTER — Ambulatory Visit (INDEPENDENT_AMBULATORY_CARE_PROVIDER_SITE_OTHER): Admitting: Family Medicine

## 2024-04-27 ENCOUNTER — Encounter (INDEPENDENT_AMBULATORY_CARE_PROVIDER_SITE_OTHER): Payer: Self-pay | Admitting: Family Medicine

## 2024-04-27 VITALS — BP 118/77 | HR 78 | Temp 98.1°F | Ht 66.0 in | Wt 208.0 lb

## 2024-04-27 DIAGNOSIS — R7303 Prediabetes: Secondary | ICD-10-CM | POA: Diagnosis not present

## 2024-04-27 DIAGNOSIS — E559 Vitamin D deficiency, unspecified: Secondary | ICD-10-CM

## 2024-04-27 DIAGNOSIS — Z6833 Body mass index (BMI) 33.0-33.9, adult: Secondary | ICD-10-CM

## 2024-04-27 DIAGNOSIS — E66812 Obesity, class 2: Secondary | ICD-10-CM

## 2024-04-27 DIAGNOSIS — E669 Obesity, unspecified: Secondary | ICD-10-CM

## 2024-04-27 MED ORDER — VITAMIN D (ERGOCALCIFEROL) 1.25 MG (50000 UNIT) PO CAPS: 50000.0000 [IU] | ORAL_CAPSULE | ORAL | 0 refills | Status: DC

## 2024-04-27 NOTE — Progress Notes (Signed)
 Office: [phone]  /  Fax: [phone]  WEIGHT SUMMARY AND BIOMETRICS  Anthropometric Measurements Height: 5' 6 (1.676 m) Weight: 208 lb (94.3 kg) BMI (Calculated): 33.59 Weight at Last Visit: 207 lb Weight Lost Since Last Visit: 0 Weight Gained Since Last Visit: 1 lb Starting Weight: 223 lb Total Weight Loss (lbs): 15 lb (6.804 kg) Peak Weight: 230 lb   Body Composition  Body Fat %: 43.3 % Fat Mass (lbs): 90.4 lbs Muscle Mass (lbs): 112.2 lbs Total Body Water (lbs): 77.2 lbs Visceral Fat Rating : 13   Other Clinical Data Fasting: no Labs: no Today's Visit #: 42 Starting Date: 12/28/20    Chief Complaint: OBESITY    History of Present Illness Jillian [name] is a 68 year old female with obesity, vitamin D  deficiency, and prediabetes who presents for obesity treatment and progress assessment.  She follows the prescribed category two eating plan about fifty percent of the time and engages in outdoor gardening and physical activity most days of the week for thirty to forty-five minutes. She has gained one pound in the last month since her last visit. She attributes some challenges to 'not eating right' and consuming 'the wrong stuff at the wrong time.'  For her vitamin D  deficiency, she is on prescription ergocalciferol  at a dose of fifty thousand international units per week and requests a refill. Her most recent lab results from the end of June showed a controlled vitamin D  level of seventy-three.  Regarding her prediabetes, she has been working on diet, exercise, and weight loss. Her A1c improved from 6.1 in December 2024 to 5.6 in June 2025. She is taking metformin  500 mg twice a day and is also decreasing simple carbohydrates and increasing exercise.  She enjoys gardening and has been actively involved in setting up and maintaining her garden, which includes activities like filling raised beds and planning for drip irrigation. She finds gardening keeps  her active and provides better tasting and potentially more nutritious food.      PHYSICAL EXAM:  Blood pressure 118/77, pulse 78, temperature 98.1 F (36.7 C), height 5' 6 (1.676 m), weight 208 lb (94.3 kg), SpO2 100%. Body mass index is 33.57 kg/m.  DIAGNOSTIC DATA REVIEWED:  BMET    Component Value Date/Time   NA 140 02/17/2024 1040   K 3.8 02/17/2024 1040   CL 101 02/17/2024 1040   CO2 21 02/17/2024 1040   GLUCOSE 89 02/17/2024 1040   GLUCOSE 94 07/11/2020 0834   BUN 17 02/17/2024 1040   CREATININE 0.95 02/17/2024 1040   CALCIUM  9.6 02/17/2024 1040   GFRNONAA 73 05/12/2019 0805   GFRAA 84 05/12/2019 0805   Lab Results  Component Value Date   HGBA1C 5.6 02/17/2024   HGBA1C 5.6 06/12/2018   Lab Results  Component Value Date   INSULIN  7.3 02/17/2024   INSULIN  8.5 06/12/2018   Lab Results  Component Value Date   TSH 2.300 01/02/2023   CBC    Component Value Date/Time   WBC 4.3 03/05/2022 1017   WBC 4.3 07/11/2020 0834   RBC 4.78 03/05/2022 1017   RBC 4.76 07/11/2020 0834   HGB 13.5 03/05/2022 1017   HCT 41.9 03/05/2022 1017   PLT 236 03/05/2022 1017   MCV 88 03/05/2022 1017   MCH 28.2 03/05/2022 1017   MCHC 32.2 03/05/2022 1017   MCHC 33.1 07/11/2020 0834   RDW 12.8 03/05/2022 1017   Iron Studies No results found for: IRON, TIBC, FERRITIN, IRONPCTSAT Lipid  Panel     Component Value Date/Time   CHOL 189 02/17/2024 1040   TRIG 52 02/17/2024 1040   HDL 88 02/17/2024 1040   CHOLHDL 2 07/11/2020 0834   VLDL 9.2 07/11/2020 0834   LDLCALC 91 02/17/2024 1040   LDLDIRECT 158.6 08/18/2013 0803   Hepatic Function Panel     Component Value Date/Time   PROT 7.3 02/17/2024 1040   ALBUMIN 4.5 02/17/2024 1040   AST 28 02/17/2024 1040   ALT 19 02/17/2024 1040   ALKPHOS 60 02/17/2024 1040   BILITOT 0.5 02/17/2024 1040   BILIDIR 0.0 08/10/2019 1608      Component Value Date/Time   TSH 2.300 01/02/2023 1330   Nutritional Lab Results   Component Value Date   VD25OH 73.3 02/17/2024   VD25OH 70.5 08/12/2023   VD25OH 56.3 01/02/2023     Assessment and Plan Assessment & Plan Obesity Obesity with mild recent weight gain attributed to water retention. Challenges with meal timing and food choices, particularly in the evenings. Engages in outdoor gardening and physical activity for 30-45 minutes most days, beneficial for weight management. - Continue category two eating plan with greater adherence. - Encourage meal planning and preparation for healthy food availability. - Emphasize protein intake and balanced meals. - Encourage continued physical activity through gardening and other enjoyable activities.  Vitamin D  Deficiency Vitamin D  deficiency well-controlled with a level of 73 as of June 2025. On ergocalciferol  50,000 IU weekly. Monitoring needed to prevent over-replacement as weight loss progresses. - Continue ergocalciferol  50,000 IU weekly. - Recheck vitamin D  levels in the fall to monitor for over-replacement. - Send prescription to meds by mail.  Prediabetes Prediabetes with significant improvement in A1c from 6.1 in December 2024 to 5.6 in June 2025. On metformin  500 mg twice daily. Actively working on diet, exercise, and weight loss.  - Continue metformin  500 mg twice daily. - Maintain dietary modifications to decrease simple carbohydrates. - Encourage continued exercise and weight management efforts.   Follow up 4 weeks   Brendalee was informed of the importance of frequent follow up visits to maximize her success with intensive lifestyle modifications for her obesity and obesity related health conditions as recommended by USPSTF and CMS guidelines   Louann Penton, MD

## 2024-06-02 ENCOUNTER — Ambulatory Visit (INDEPENDENT_AMBULATORY_CARE_PROVIDER_SITE_OTHER): Admitting: Family Medicine

## 2024-06-02 ENCOUNTER — Encounter (INDEPENDENT_AMBULATORY_CARE_PROVIDER_SITE_OTHER): Payer: Self-pay | Admitting: Family Medicine

## 2024-06-02 VITALS — BP 126/81 | HR 91 | Temp 98.8°F | Ht 66.0 in | Wt 206.0 lb

## 2024-06-02 DIAGNOSIS — E669 Obesity, unspecified: Secondary | ICD-10-CM

## 2024-06-02 DIAGNOSIS — I10 Essential (primary) hypertension: Secondary | ICD-10-CM | POA: Diagnosis not present

## 2024-06-02 DIAGNOSIS — F3289 Other specified depressive episodes: Secondary | ICD-10-CM

## 2024-06-02 DIAGNOSIS — Z6833 Body mass index (BMI) 33.0-33.9, adult: Secondary | ICD-10-CM

## 2024-06-02 DIAGNOSIS — E559 Vitamin D deficiency, unspecified: Secondary | ICD-10-CM | POA: Diagnosis not present

## 2024-06-02 DIAGNOSIS — F5089 Other specified eating disorder: Secondary | ICD-10-CM | POA: Diagnosis not present

## 2024-06-02 DIAGNOSIS — E66812 Obesity, class 2: Secondary | ICD-10-CM

## 2024-06-02 MED ORDER — BUPROPION HCL ER (SR) 200 MG PO TB12: 200.0000 mg | ORAL_TABLET | Freq: Every morning | ORAL | 0 refills | Status: DC

## 2024-06-02 MED ORDER — VITAMIN D (ERGOCALCIFEROL) 1.25 MG (50000 UNIT) PO CAPS
50000.0000 [IU] | ORAL_CAPSULE | ORAL | 0 refills | Status: DC
Start: 2024-06-02 — End: 2024-07-13

## 2024-06-02 NOTE — Addendum Note (Signed)
 Addended by: DELORES CASTOR A on: 06/02/2024 12:32 PM   Modules accepted: Level of Service

## 2024-06-02 NOTE — Progress Notes (Signed)
 Office: [phone]  /  Fax: [phone]  WEIGHT SUMMARY AND BIOMETRICS  Anthropometric Measurements Height: 5' 6 (1.676 m) Weight: 206 lb (93.4 kg) BMI (Calculated): 33.27 Weight at Last Visit: 208lb Weight Lost Since Last Visit: 2lb Weight Gained Since Last Visit: 0lb Starting Weight: 223lb Total Weight Loss (lbs): 17 lb (7.711 kg) Peak Weight: 230lb   Body Composition  Body Fat %: 42.7 % Fat Mass (lbs): 88.2 lbs Muscle Mass (lbs): 112.4 lbs Total Body Water (lbs): 75.6 lbs Visceral Fat Rating : 13   Other Clinical Data Fasting: No Labs: No Today's Visit #: 54 Starting Date: 12/28/20    Chief Complaint: OBESITY   History of Present Illness HELON [name] is a 68 year old female who presents for a follow-up on her treatment plan and progress.  She adheres to a category two eating plan approximately 65% of the time and engages in physical activity by walking for 30 minutes, five days a week. She has lost two pounds in the last month since her previous visit.  She is being treated for emotional eating behaviors, which are distinct from her obesity diagnosis. She manages her emotional eating well, particularly during the warmer months when she can spend time in her garden, which she describes as her 'happy place.' She has not experienced cravings for sugary desserts and has been consuming more vegetables and fruits from her garden.  She is currently taking Wellbutrin  SR 200 mg per day for her emotional eating and requests a refill. She also takes prescription vitamin D  50,000 IU and has about four to five weeks' supply remaining. She recently refilled her blood pressure medication and reports that her blood pressure has been stable.  She discusses her gardening activities extensively, which contribute to her physical activity and healthy eating habits. She grows a variety of vegetables and enjoys sharing her produce with others. She plans to continue  gardening through the winter with some winter crops in her raised beds.      PHYSICAL EXAM:  Blood pressure 126/81, pulse 91, temperature 98.8 F (37.1 C), height 5' 6 (1.676 m), weight 206 lb (93.4 kg), SpO2 98%. Body mass index is 33.25 kg/m.  DIAGNOSTIC DATA REVIEWED:  BMET    Component Value Date/Time   NA 140 02/17/2024 1040   K 3.8 02/17/2024 1040   CL 101 02/17/2024 1040   CO2 21 02/17/2024 1040   GLUCOSE 89 02/17/2024 1040   GLUCOSE 94 07/11/2020 0834   BUN 17 02/17/2024 1040   CREATININE 0.95 02/17/2024 1040   CALCIUM  9.6 02/17/2024 1040   GFRNONAA 73 05/12/2019 0805   GFRAA 84 05/12/2019 0805   Lab Results  Component Value Date   HGBA1C 5.6 02/17/2024   HGBA1C 5.6 06/12/2018   Lab Results  Component Value Date   INSULIN  7.3 02/17/2024   INSULIN  8.5 06/12/2018   Lab Results  Component Value Date   TSH 2.300 01/02/2023   CBC    Component Value Date/Time   WBC 4.3 03/05/2022 1017   WBC 4.3 07/11/2020 0834   RBC 4.78 03/05/2022 1017   RBC 4.76 07/11/2020 0834   HGB 13.5 03/05/2022 1017   HCT 41.9 03/05/2022 1017   PLT 236 03/05/2022 1017   MCV 88 03/05/2022 1017   MCH 28.2 03/05/2022 1017   MCHC 32.2 03/05/2022 1017   MCHC 33.1 07/11/2020 0834   RDW 12.8 03/05/2022 1017   Iron Studies No results found for: IRON, TIBC, FERRITIN, IRONPCTSAT Lipid Panel  Component Value Date/Time   CHOL 189 02/17/2024 1040   TRIG 52 02/17/2024 1040   HDL 88 02/17/2024 1040   CHOLHDL 2 07/11/2020 0834   VLDL 9.2 07/11/2020 0834   LDLCALC 91 02/17/2024 1040   LDLDIRECT 158.6 08/18/2013 0803   Hepatic Function Panel     Component Value Date/Time   PROT 7.3 02/17/2024 1040   ALBUMIN 4.5 02/17/2024 1040   AST 28 02/17/2024 1040   ALT 19 02/17/2024 1040   ALKPHOS 60 02/17/2024 1040   BILITOT 0.5 02/17/2024 1040   BILIDIR 0.0 08/10/2019 1608      Component Value Date/Time   TSH 2.300 01/02/2023 1330   Nutritional Lab Results  Component  Value Date   VD25OH 73.3 02/17/2024   VD25OH 70.5 08/12/2023   VD25OH 56.3 01/02/2023     Assessment and Plan Assessment & Plan Obesity Obesity management is ongoing with a focus on dietary modifications and physical activity. She is following the category two eating plan approximately 65% of the time and is engaging in regular physical activity, walking 30 minutes five days a week. She has achieved a weight loss of two pounds over the past month. The emphasis is on increasing vegetable and fruit intake, as well as protein consumption, aiming for a minimum of 40 grams per meal. - Continue category two eating plan - Encourage regular physical activity, walking 30 minutes five days a week  Emotional eating behaviors Emotional eating behaviors are being addressed with pharmacotherapy and lifestyle modifications. She is currently on Wellbutrin  SR 200 mg daily, which she reports is helping her manage emotional eating. She has not experienced significant cravings for sugary desserts and is finding satisfaction in her current diet, which includes a variety of vegetables and fruits. She is aware of the potential for increased emotional eating during the winter months and plans to engage in activities such as volunteering and gardening to mitigate this risk. - Refill Wellbutrin  SR 200 mg daily - Encourage engagement in activities to manage emotional eating, especially during winter months  Hypertension Hypertension is well-controlled with current medication regimen. She reports blood pressure readings around 120-125 mmHg, which is an improvement from previous lower readings that were associated with symptoms. She recently refilled her blood pressure medication and reports feeling better with the current blood pressure range. - Continue current antihypertensive medication regimen  Vitamin D  deficiency Vitamin D  deficiency is being managed with prescription vitamin D  supplementation. She is currently  taking vitamin D  50,000 IU and has approximately four to five weeks of supply remaining. Plans are in place to check vitamin D  levels at the end of the year along with other labs. - Refill vitamin D  50,000 IU - Check vitamin D  levels at the end of the year    Jillian [name] was informed of the importance of frequent follow up visits to maximize her success with intensive lifestyle modifications for her obesity and obesity related health conditions as recommended by USPSTF and CMS guidelines   Louann Penton, MD

## 2024-06-19 ENCOUNTER — Other Ambulatory Visit: Payer: Self-pay | Admitting: Surgical

## 2024-06-19 DIAGNOSIS — M25572 Pain in left ankle and joints of left foot: Secondary | ICD-10-CM

## 2024-06-22 ENCOUNTER — Encounter: Payer: Self-pay | Admitting: Radiology

## 2024-07-13 ENCOUNTER — Encounter (INDEPENDENT_AMBULATORY_CARE_PROVIDER_SITE_OTHER): Payer: Self-pay | Admitting: Family Medicine

## 2024-07-13 ENCOUNTER — Ambulatory Visit (INDEPENDENT_AMBULATORY_CARE_PROVIDER_SITE_OTHER): Payer: Self-pay | Admitting: Family Medicine

## 2024-07-13 VITALS — BP 120/76 | HR 71 | Temp 98.1°F | Ht 66.0 in | Wt 208.0 lb

## 2024-07-13 DIAGNOSIS — F5089 Other specified eating disorder: Secondary | ICD-10-CM | POA: Diagnosis not present

## 2024-07-13 DIAGNOSIS — R7303 Prediabetes: Secondary | ICD-10-CM

## 2024-07-13 DIAGNOSIS — E559 Vitamin D deficiency, unspecified: Secondary | ICD-10-CM | POA: Diagnosis not present

## 2024-07-13 DIAGNOSIS — E669 Obesity, unspecified: Secondary | ICD-10-CM

## 2024-07-13 DIAGNOSIS — F3289 Other specified depressive episodes: Secondary | ICD-10-CM

## 2024-07-13 DIAGNOSIS — I10 Essential (primary) hypertension: Secondary | ICD-10-CM | POA: Diagnosis not present

## 2024-07-13 DIAGNOSIS — Z6835 Body mass index (BMI) 35.0-35.9, adult: Secondary | ICD-10-CM

## 2024-07-13 DIAGNOSIS — Z6833 Body mass index (BMI) 33.0-33.9, adult: Secondary | ICD-10-CM

## 2024-07-13 MED ORDER — METFORMIN HCL 500 MG PO TABS: 500.0000 mg | ORAL_TABLET | Freq: Two times a day (BID) | ORAL | 0 refills | Status: AC

## 2024-07-13 MED ORDER — BUPROPION HCL ER (SR) 200 MG PO TB12: 200.0000 mg | ORAL_TABLET | Freq: Every morning | ORAL | 0 refills | Status: AC

## 2024-07-13 MED ORDER — VITAMIN D (ERGOCALCIFEROL) 1.25 MG (50000 UNIT) PO CAPS: 50000.0000 [IU] | ORAL_CAPSULE | ORAL | 0 refills | Status: AC

## 2024-07-13 MED ORDER — HYDROCHLOROTHIAZIDE 12.5 MG PO TABS: 12.5000 mg | ORAL_TABLET | Freq: Every day | ORAL | 0 refills | Status: AC

## 2024-07-13 NOTE — Addendum Note (Signed)
 Addended by: LAFE BAKER CROME on: 07/13/2024 11:07 AM   Modules accepted: Level of Service

## 2024-07-13 NOTE — Progress Notes (Signed)
 Office: [phone]  /  Fax: [phone]  WEIGHT SUMMARY AND BIOMETRICS  Anthropometric Measurements Height: 5' 6 (1.676 m) Weight: 208 lb (94.3 kg) BMI (Calculated): 33.59 Weight at Last Visit: 206 lb Weight Lost Since Last Visit: 0 Weight Gained Since Last Visit: 2 lb Starting Weight: 223 lb Total Weight Loss (lbs): 15 lb (6.804 kg) Peak Weight: 230 lb   Body Composition  Body Fat %: 44.2 % Fat Mass (lbs): 92 lbs Muscle Mass (lbs): 110.2 lbs Total Body Water (lbs): 77 lbs Visceral Fat Rating : 13   Other Clinical Data Fasting: no Labs: no Today's Visit #: 44 Starting Date: 12/28/20    Chief Complaint: OBESITY    History of Present Illness Jillian [name] is a 68 year old female with obesity, hypertension, and prediabetes who presents for obesity treatment and progress assessment.  She has been following the category two eating plan approximately 75% of the time but has gained two pounds over the last six weeks. She engages in yard work for about 60 minutes, three days a week, and is concerned about the weight gain, stating, 'I thought I was doing better.'  Her hypertension is managed with hydrochlorothiazide  12.5 mg daily.  She has prediabetes and is currently taking metformin  500 mg twice a day, and follows diet, exercise, and weight loss recommendations. Her most recent lab work in June showed an A1c of 5.6%. She requests a refill of her metformin .  She has a history of vitamin D  deficiency and is on prescription ergocalciferol  50,000 IU weekly. Her last vitamin D  level in June was 73.3.  She experiences emotional eating behaviors and is treated with Wellbutrin  SR 200 mg in the morning. She reports stability and requests a refill.  In terms of social history, she enjoys doing yard work, which she finds beneficial for her mental health. She prefers exercising alone and has access to home exercise equipment but struggles to establish a routine. She  drinks about five bottles of water daily, each approximately 16 ounces, and feels better when she starts her day with water.      PHYSICAL EXAM:  Blood pressure 120/76, pulse 71, temperature 98.1 F (36.7 C), height 5' 6 (1.676 m), weight 208 lb (94.3 kg), SpO2 99%. Body mass index is 33.57 kg/m.  DIAGNOSTIC DATA REVIEWED:  BMET    Component Value Date/Time   NA 140 02/17/2024 1040   K 3.8 02/17/2024 1040   CL 101 02/17/2024 1040   CO2 21 02/17/2024 1040   GLUCOSE 89 02/17/2024 1040   GLUCOSE 94 07/11/2020 0834   BUN 17 02/17/2024 1040   CREATININE 0.95 02/17/2024 1040   CALCIUM  9.6 02/17/2024 1040   GFRNONAA 73 05/12/2019 0805   GFRAA 84 05/12/2019 0805   Lab Results  Component Value Date   HGBA1C 5.6 02/17/2024   HGBA1C 5.6 06/12/2018   Lab Results  Component Value Date   INSULIN  7.3 02/17/2024   INSULIN  8.5 06/12/2018   Lab Results  Component Value Date   TSH 2.300 01/02/2023   CBC    Component Value Date/Time   WBC 4.3 03/05/2022 1017   WBC 4.3 07/11/2020 0834   RBC 4.78 03/05/2022 1017   RBC 4.76 07/11/2020 0834   HGB 13.5 03/05/2022 1017   HCT 41.9 03/05/2022 1017   PLT 236 03/05/2022 1017   MCV 88 03/05/2022 1017   MCH 28.2 03/05/2022 1017   MCHC 32.2 03/05/2022 1017   MCHC 33.1 07/11/2020 0834   RDW 12.8  03/05/2022 1017   Iron Studies No results found for: IRON, TIBC, FERRITIN, IRONPCTSAT Lipid Panel     Component Value Date/Time   CHOL 189 02/17/2024 1040   TRIG 52 02/17/2024 1040   HDL 88 02/17/2024 1040   CHOLHDL 2 07/11/2020 0834   VLDL 9.2 07/11/2020 0834   LDLCALC 91 02/17/2024 1040   LDLDIRECT 158.6 08/18/2013 0803   Hepatic Function Panel     Component Value Date/Time   PROT 7.3 02/17/2024 1040   ALBUMIN 4.5 02/17/2024 1040   AST 28 02/17/2024 1040   ALT 19 02/17/2024 1040   ALKPHOS 60 02/17/2024 1040   BILITOT 0.5 02/17/2024 1040   BILIDIR 0.0 08/10/2019 1608      Component Value Date/Time   TSH 2.300  01/02/2023 1330   Nutritional Lab Results  Component Value Date   VD25OH 73.3 02/17/2024   VD25OH 70.5 08/12/2023   VD25OH 56.3 01/02/2023     Assessment and Plan Assessment & Plan Obesity Management is ongoing with a focus on diet and exercise. She adheres to the category two eating plan 75% of the time and engages in yard work for 60 minutes, three days a week. Recent weight gain of 2 pounds attributed to fluid retention rather than fat gain. Muscle mass is stable, but there is a slight decrease, possibly due to fluid shifts. - Continue category two eating plan. - Encouraged continuation of yard work for exercise. - Recommended indoor exercise options such as [employer] and chair yoga for muscle strengthening and balance. - Advised maintaining hydration with 80 ounces of calorie-free, caffeine-free liquid daily.  Essential hypertension Hypertension is well controlled with hydrochlorothiazide  12.5 mg daily. Blood pressure is 120/76 mmHg. - Continue hydrochlorothiazide  12.5 mg daily. - Continue diet, exercise and weight loss as discussed today as an important part of the treatment plan   Prediabetes Managed with diet, exercise, weight loss, and metformin  500 mg twice daily. Recent A1c improved to 5.6% as of June 2025. - Continue metformin  500 mg twice daily. - Refilled metformin  prescription. - Continue diet, exercise and weight loss as discussed today as an important part of the treatment plan  Vitamin D  deficiency Managed with prescription ergocalciferol  50,000 IU weekly. Last vitamin D  level in June 2025 was 73.3 ng/mL. There is an increased risk of vitamin D  toxicity due to high dose and weight loss. - Continue ergocalciferol  50,000 IU weekly. - Will schedule labs to monitor vitamin D  levels.  Emotional Eating Behaviors Managed with Wellbutrin  SR 200 mg in the morning. She is stable and requests a refill. - Continue Wellbutrin  SR 200 mg in the  morning. - Refilled Wellbutrin  SR prescription.      Patients who are on anti-obesity medications are counseled on the importance of maintaining healthy lifestyle habits, including balanced nutrition, regular physical activity, and behavioral modifications,  Medication is an adjunct to, not a replacement for, lifestyle changes and that the long-term success and weight maintenance depend on continued adherence to these strategies.   Kindle was informed of the importance of frequent follow up visits to maximize her success with intensive lifestyle modifications for her obesity and obesity related health conditions as recommended by USPSTF and CMS guidelines   Louann Penton, MD

## 2024-07-15 ENCOUNTER — Encounter: Payer: Self-pay | Admitting: Orthopedic Surgery

## 2024-07-15 ENCOUNTER — Other Ambulatory Visit (INDEPENDENT_AMBULATORY_CARE_PROVIDER_SITE_OTHER)

## 2024-07-15 ENCOUNTER — Ambulatory Visit (INDEPENDENT_AMBULATORY_CARE_PROVIDER_SITE_OTHER): Admitting: Orthopedic Surgery

## 2024-07-15 DIAGNOSIS — M5442 Lumbago with sciatica, left side: Secondary | ICD-10-CM

## 2024-07-15 DIAGNOSIS — G8929 Other chronic pain: Secondary | ICD-10-CM

## 2024-07-15 NOTE — Progress Notes (Signed)
 Office Visit Note   Patient: Jillian [name]           Date of Birth: 05-20-1956           MRN: [id number] Visit Date: 07/15/2024 Requested by: Gretta Comer POUR, NP [address] [place],  [place] [address] PCP: Gretta Comer POUR, NP  Subjective: Chief Complaint  Patient presents with   Lower Back - Pain    HPI: Jillian [name] is a 68 y.o. female who presents to the office reporting low back pain and left leg pain.  It is radiating some below the knee.  Better now than when she made the appointment.  Was having a lot of tingling on that left-hand side.  Takes Mobic  typically and that improves her situation after 2 to 3 days.  Did have an L-spine MRI in 2021 which showed L4-5 disc bulge with possible impingement on both nerve roots.  Has a history of doing therapy in the past and has been doing physical therapy exercises currently.  Also has a remote history of epidural steroid injection in the lumbar spine.  She is very active during the summer and does more exercising and stretching out during the winter..                ROS: All systems reviewed are negative as they relate to the chief complaint within the history of present illness.  Patient denies fevers or chills.  Assessment & Plan: Visit Diagnoses:  1. Chronic left-sided low back pain with left-sided sciatica     Plan: Impression is low back pain with some left-sided radicular symptoms.  Plain radiographs today show mild degenerative changes compatible with her age and activity level.  Plan at this time is referred to Dr. Eldonna for lumbar spine ESI if Richel does not get relief from taking Mobic  5 to 7 days in a row.  No weakness or intractable pain today.  Follow-Up Instructions: No follow-ups on file.   Orders:  Orders Placed This Encounter  Procedures   XR Lumbar Spine 2-3 Views   No orders of the defined types were placed in this encounter.     Procedures: No procedures performed   Clinical  Data: No additional findings.  Objective: Vital Signs: There were no vitals taken for this visit.  Physical Exam:  Constitutional: Patient appears well-developed HEENT:  Head: Normocephalic Eyes:EOM are normal Neck: Normal range of motion Cardiovascular: Normal rate Pulmonary/chest: Effort normal Neurologic: Patient is alert Skin: Skin is warm Psychiatric: Patient has normal mood and affect  Ortho Exam: Patient has bilateral 5 out of 5 ankle dorsiflexion plantarflexion quad hamstring and hip flexion strength.  Bilateral palpable pedal pulses and no paresthesias L1-S1 in either leg.  No nerve root tension signs.  Minimal pain with forward and lateral bending.  No groin pain with internal and external rotation of either leg.  No trochanteric tenderness.  No masses lymphadenopathy or skin changes around the back.   Specialty Comments:  No specialty comments available.  Imaging: No results found.   PMFS History: Patient Active Problem List   Diagnosis Date Noted   Other insomnia 04/10/2023   BMI 33.0-33.9,adult 09/27/2022   Obesity, Beginning BMI 34.22 09/27/2022   Chronic hand pain 08/07/2022   Essential hypertension 06/21/2022   Prediabetes 02/05/2019   Depression 02/05/2019   Dry skin 06/02/2018   Chronic back pain 08/09/2017   Primary osteoarthritis of right shoulder 09/20/2016   Alopecia 01/10/2016   Class 1  obesity with serious comorbidity and body mass index (BMI) of 34.0 to 34.9 in adult 09/20/2015   Preventative health care 09/08/2015   Former smoker 09/08/2014   HLD (hyperlipidemia) 09/01/2013   Vitamin D  deficiency 07/03/2010   Vaginal atrophy 06/30/2009   Allergic rhinitis 03/01/2009   Past Medical History:  Diagnosis Date   Age-related nuclear cataract of both eyes 11/08/2020   Allergic rhinitis    Allergy    Arthritis    Arthritis of low back    Back pain    Cataract    Cataracts, bilateral    Constipation    Depression    Dry skin    on hands    Fibroid uterus    Hyperlipidemia    Joint pain    Osteoarthritis of right shoulder    Pre-diabetes    Ringing in ears    Sciatica    sciatica   Vitamin D  deficiency     Family History  Problem Relation Age of Onset   Diabetes Mother        father and 9 siblings   Kidney disease Mother        renal failure   Hypertension Mother    Hyperlipidemia Mother    Stroke Mother    Depression Mother    Anxiety disorder Mother    Obesity Mother    Heart failure Mother    Heart disease Father        paternal grandmother   Obesity Father    Diabetes Father    Hypertension Father    Hyperlipidemia Father    Eating disorder Father    Colon cancer Sister    Kidney cancer Sister    Colon polyps Sister    Prostate cancer Brother    Esophageal cancer Neg Hx    Rectal cancer Neg Hx    Stomach cancer Neg Hx     Past Surgical History:  Procedure Laterality Date   ABDOMINAL HYSTERECTOMY     broken ankle     CARPAL TUNNEL RELEASE Bilateral    CATARACT EXTRACTION W/ INTRAOCULAR LENS IMPLANT Bilateral 05/22/2021   2nd eye done 07/03/21   SHOULDER SURGERY Right    SKIN BIOPSY Right 10/2021   Social History   Occupational History   Occupation: Retired Buyer, Retail  Tobacco Use   Smoking status: Former    Current packs/day: 1.00    Average packs/day: 1 pack/day for 29.0 years (29.0 ttl pk-yrs)    Types: Cigarettes   Smokeless tobacco: Never   Tobacco comments:    30 years  Vaping Use   Vaping status: Never Used  Substance and Sexual Activity   Alcohol use: No   Drug use: No   Sexual activity: Not on file

## 2024-07-21 LAB — HM MAMMOGRAPHY

## 2024-07-22 ENCOUNTER — Encounter: Payer: Self-pay | Admitting: Primary Care

## 2024-07-27 DIAGNOSIS — E785 Hyperlipidemia, unspecified: Secondary | ICD-10-CM

## 2024-07-27 MED ORDER — ATORVASTATIN CALCIUM 20 MG PO TABS: 20.0000 mg | ORAL_TABLET | Freq: Every evening | ORAL | 0 refills | Status: AC

## 2024-08-06 ENCOUNTER — Ambulatory Visit: Payer: Medicare Other

## 2024-08-06 VITALS — Ht 66.0 in | Wt 208.0 lb

## 2024-08-06 DIAGNOSIS — Z0000 Encounter for general adult medical examination without abnormal findings: Secondary | ICD-10-CM

## 2024-08-06 NOTE — Progress Notes (Signed)
 Chief Complaint  Patient presents with   Medicare Wellness     Subjective:   FIZZA SCALES is a 68 y.o. female who presents for a Medicare Annual Wellness Visit.  Visit info / Clinical Intake: Medicare Wellness Visit Type:: Subsequent Annual Wellness Visit Persons participating in visit and providing information:: patient Medicare Wellness Visit Mode:: Telephone If telephone:: video declined Since this visit was completed virtually, some vitals may be partially provided or unavailable. Missing vitals are due to the limitations of the virtual format.: Unable to obtain vitals - no equipment If Telephone or Video please confirm:: I connected with patient using audio/video enable telemedicine. I verified patient identity with two identifiers, discussed telehealth limitations, and patient agreed to proceed. Patient Location:: home Provider Location:: clinic Interpreter Needed?: No Pre-visit prep was completed: yes AWV questionnaire completed by patient prior to visit?: no Living arrangements:: lives with spouse/significant other Patient's Overall Health Status Rating: very good Typical amount of pain: some Does pain affect daily life?: no Are you currently prescribed opioids?: no  Dietary Habits and Nutritional Risks How many meals a day?: 2 Eats fruit and vegetables daily?: yes Most meals are obtained by: preparing own meals In the last 2 weeks, have you had any of the following?: none Diabetic:: no  Functional Status Activities of Daily Living (to include ambulation/medication): Independent Ambulation: Independent Medication Administration: Independent Home Management (perform basic housework or laundry): Independent Manage your own finances?: yes Primary transportation is: driving Concerns about vision?: no *vision screening is required for WTM* Concerns about hearing?: no  Fall Screening Falls in the past year?: 0 Number of falls in past year: 0 Was there an  injury with Fall?: 0 Fall Risk Category Calculator: 0 Patient Fall Risk Level: Low Fall Risk  Fall Risk Patient at Risk for Falls Due to: No Fall Risks Fall risk Follow up: Falls evaluation completed; Education provided; Falls prevention discussed  Home and Transportation Safety: All rugs have non-skid backing?: N/A, no rugs All stairs or steps have railings?: yes Grab bars in the bathtub or shower?: yes Have non-skid surface in bathtub or shower?: yes Good home lighting?: yes Regular seat belt use?: yes Hospital stays in the last year:: no  Cognitive Assessment Difficulty concentrating, remembering, or making decisions? : no Will 6CIT or Mini Cog be Completed: yes What year is it?: 0 points What month is it?: 0 points Give patient an address phrase to remember (5 components): [address] [place]  About what time is it?: 0 points Count backwards from 20 to 1: 0 points Say the months of the year in reverse: 0 points Repeat the address phrase from earlier: 0 points 6 CIT Score: 0 points  Advance Directives (For Healthcare) Does Patient Have a Medical Advance Directive?: Yes Does patient want to make changes to medical advance directive?: No - Patient declined Type of Advance Directive: Living will; Healthcare Power of Attorney Copy of Healthcare Power of Attorney in Chart?: No - copy requested Copy of Living Will in Chart?: No - copy requested  Reviewed/Updated  Reviewed/Updated: Reviewed All (Medical, Surgical, Family, Medications, Allergies, Care Teams, Patient Goals)    Allergies (verified) Patient has no known allergies.   Current Medications (verified) Outpatient Encounter Medications as of 08/06/2024  Medication Sig   Ascorbic Acid (VITAMIN C) 1000 MG tablet Take 1,000 mg by mouth daily.   aspirin EC 81 MG tablet Take 81 mg by mouth daily.   atorvastatin  (LIPITOR) 20 MG tablet Take 1 tablet (20  mg total) by mouth every evening. for cholesterol    buPROPion  (WELLBUTRIN  SR) 200 MG 12 hr tablet Take 1 tablet (200 mg total) by mouth every morning.   CALCIUM -MAGNESIUM-ZINC PO Take 1 capsule by mouth daily.   cetirizine  (ZYRTEC ) 10 MG tablet Take 1 tablet (10 mg total) by mouth daily as needed.   Coenzyme Q10 (CO Q 10) 100 MG CAPS Take 1 capsule by mouth daily.   fluocinonide -emollient (LIDEX -E) 0.05 % cream APPLY  CREAM EXTERNALLY TWICE DAILY TO  AFFECTED  AREA   fluticasone  (FLONASE ) 50 MCG/ACT nasal spray Place 1 spray into both nostrils daily as needed.   GARLIC PO Take 600 mg by mouth daily.   hydrochlorothiazide  (HYDRODIURIL ) 12.5 MG tablet Take 1 tablet (12.5 mg total) by mouth daily.   Hydroquinone 8 % EMUL Apply topically.   meloxicam  (MOBIC ) 15 MG tablet Take 1 tablet by mouth once daily   metFORMIN  (GLUCOPHAGE ) 500 MG tablet Take 1 tablet (500 mg total) by mouth 2 (two) times daily with a meal. For blood sugar.   Multiple Vitamins-Minerals (ALIVE ONCE DAILY WOMENS 50+) TABS Take 1 tablet by mouth daily.   Omega-3 1000 MG CAPS Take 1 capsule by mouth daily.   Probiotic Product (PROBIOTIC-10) CAPS Take 1 capsule by mouth daily.   Turmeric Curcumin 500 MG CAPS Take 1 capsule by mouth daily.   Vitamin D , Ergocalciferol , (DRISDOL ) 1.25 MG (50000 UNIT) CAPS capsule Take 1 capsule (50,000 Units total) by mouth every 7 (seven) days.   vitamin E 400 UNIT capsule Take 400 Units by mouth daily.   No facility-administered encounter medications on file as of 08/06/2024.    History: Past Medical History:  Diagnosis Date   Age-related nuclear cataract of both eyes 11/08/2020   Allergic rhinitis    Allergy    Arthritis    Arthritis of low back    Back pain    Cataract    Cataracts, bilateral    Constipation    Depression    Dry skin    on hands   Fibroid uterus    Hyperlipidemia    Joint pain    Osteoarthritis of right shoulder    Pre-diabetes    Ringing in ears    Sciatica    sciatica   Vitamin D  deficiency    Past  Surgical History:  Procedure Laterality Date   ABDOMINAL HYSTERECTOMY     broken ankle     CARPAL TUNNEL RELEASE Bilateral    CATARACT EXTRACTION W/ INTRAOCULAR LENS IMPLANT Bilateral 05/22/2021   2nd eye done 07/03/21   FRACTURE SURGERY  1985   !   SHOULDER SURGERY Right    SKIN BIOPSY Right 10/2021   Family History  Problem Relation Age of Onset   Diabetes Mother        father and 9 siblings   Kidney disease Mother        renal failure   Hypertension Mother    Hyperlipidemia Mother    Stroke Mother    Depression Mother    Anxiety disorder Mother    Obesity Mother    Heart failure Mother    Heart disease Father        paternal grandmother   Obesity Father    Diabetes Father    Hypertension Father    Hyperlipidemia Father    Eating disorder Father    Colon cancer Sister    Kidney cancer Sister    Arthritis Sister    Asthma Sister  Kidney disease Sister    Colon polyps Sister    Stroke Sister    Vision loss Sister    Prostate cancer Brother    Cancer Sister    Diabetes Sister    Early death Brother    Esophageal cancer Neg Hx    Rectal cancer Neg Hx    Stomach cancer Neg Hx    Social History   Occupational History   Occupation: Retired [employer] Fundraiser  Tobacco Use   Smoking status: Former    Current packs/day: 1.00    Average packs/day: 1 pack/day for 29.0 years (29.0 ttl pk-yrs)    Types: Cigarettes   Smokeless tobacco: Never   Tobacco comments:    30 years  Vaping Use   Vaping status: Never Used  Substance and Sexual Activity   Alcohol use: No   Drug use: No   Sexual activity: Not on file   Tobacco Counseling Counseling given: Not Answered Tobacco comments: 30 years  SDOH Screenings   Food Insecurity: No Food Insecurity (08/06/2024)  Housing: Unknown (08/06/2024)  Transportation Needs: No Transportation Needs (08/06/2024)  Utilities: Not At Risk (08/06/2024)  Alcohol Screen: Low Risk (08/06/2023)  Depression (PHQ2-9): Low Risk  (08/06/2024)  Financial Resource Strain: Low Risk (08/06/2023)  Physical Activity: Insufficiently Active (08/06/2024)  Social Connections: Socially Integrated (08/06/2024)  Stress: No Stress Concern Present (08/06/2024)  Tobacco Use: Medium Risk (08/06/2024)  Health Literacy: Adequate Health Literacy (08/06/2024)   See flowsheets for full screening details  Depression Screen PHQ 2 & 9 Depression Scale- Over the past 2 weeks, how often have you been bothered by any of the following problems? Little interest or pleasure in doing things: 0 Feeling down, depressed, or hopeless (PHQ Adolescent also includes...irritable): 0 PHQ-2 Total Score: 0     Goals Addressed             This Visit's Progress    Increase physical activity   On track    As tolerated..pt wants to continue tis goal     Patient Stated   On track    Would like to maintain current health- eating healthier, drinking more water and exercising. Pt would like to continue this goal             Objective:    Today's Vitals   08/06/24 1058  Weight: 208 lb (94.3 kg)  Height: 5' 6 (1.676 m)   Body mass index is 33.57 kg/m.  Hearing/Vision screen Vision Screening - Comments:: UTD w/Dr JINNY Hamilton Immunizations and Health Maintenance Health Maintenance  Topic Date Due   Influenza Vaccine  03/20/2024   COVID-19 Vaccine (8 - 2025-26 season) 04/20/2024   Medicare Annual Wellness (AWV)  08/05/2024   Mammogram  07/21/2025   Colonoscopy  07/30/2028   DTaP/Tdap/Td (4 - Td or Tdap) 06/07/2029   Pneumococcal Vaccine: 50+ Years  Completed   Bone Density Scan  Completed   Hepatitis C Screening  Completed   Zoster Vaccines- Shingrix  Completed   Meningococcal B Vaccine  Aged Out   Lung Cancer Screening  Discontinued        Assessment/Plan:  This is a routine wellness examination for Anatalia.  Patient Care Team: Gretta Comer POUR, NP as PCP - General (Internal Medicine) Cathlyn Seal, MD as Consulting  Physician (Dermatology) Hamilton Simmonds, OD as Referring Physician (Optometry) Evertt Meribeth ORN, MD (Ophthalmology)  I have personally reviewed and noted the following in the patients chart:   Medical and social history Use of alcohol,  tobacco or illicit drugs  Current medications and supplements including opioid prescriptions. Functional ability and status Nutritional status Physical activity Advanced directives List of other physicians Hospitalizations, surgeries, and ER visits in previous 12 months Vitals Screenings to include cognitive, depression, and falls Referrals and appointments  No orders of the defined types were placed in this encounter.  In addition, I have reviewed and discussed with patient certain preventive protocols, quality metrics, and best practice recommendations. A written personalized care plan for preventive services as well as general preventive health recommendations were provided to patient.   Erminio LITTIE Saris, LPN   [id number]    After Visit Summary: (MyChart) Due to this being a telephonic visit, the after visit summary with patients personalized plan was offered to patient via MyChart   Nurse Notes: No voiced or noted concerns at this time Patient advised to keep follow-up appointment with PCP (08/18/25) Appointment(s) made: (AWV/CPE Jan 2027) HM Addressed: Vaccines Due: Covid and Influenza Pt sts she had these in

## 2024-08-06 NOTE — Patient Instructions (Signed)
 Jillian [name],  Thank you for taking the time for your Medicare Wellness Visit. I appreciate your continued commitment to your health goals. Please review the care plan we discussed, and feel free to reach out if I can assist you further.  Please note that Annual Wellness Visits do not include a physical exam. Some assessments may be limited, especially if the visit was conducted virtually. If needed, we may recommend an in-person follow-up with your provider.  Ongoing Care Seeing your primary care provider every 3 to 6 months helps us  monitor your health and provide consistent, personalized care.   Referrals If a referral was made during today's visit and you haven't received any updates within two weeks, please contact the referred provider directly to check on the status.  Recommended Screenings:  Health Maintenance  Topic Date Due   Flu Shot  03/20/2024   COVID-19 Vaccine (8 - 2025-26 season) 04/20/2024   Medicare Annual Wellness Visit  08/05/2024   Breast Cancer Screening  07/21/2025   Colon Cancer Screening  07/30/2028   DTaP/Tdap/Td vaccine (4 - Td or Tdap) 06/07/2029   Pneumococcal Vaccine for age over 9  Completed   Osteoporosis screening with Bone Density Scan  Completed   Hepatitis C Screening  Completed   Zoster (Shingles) Vaccine  Completed   Meningitis B Vaccine  Aged Out   Screening for Lung Cancer  Discontinued       08/06/2024   10:59 AM  Advanced Directives  Does Patient Have a Medical Advance Directive? Yes  Type of Advance Directive Living will;Healthcare Power of Attorney  Does patient want to make changes to medical advance directive? No - Patient declined  Copy of Healthcare Power of Attorney in Chart? No - copy requested    Vision: Annual vision screenings are recommended for early detection of glaucoma, cataracts, and diabetic retinopathy. These exams can also reveal signs of chronic conditions such as diabetes and high blood pressure.  Dental:  Annual dental screenings help detect early signs of oral cancer, gum disease, and other conditions linked to overall health, including heart disease and diabetes.

## 2024-08-17 ENCOUNTER — Ambulatory Visit (INDEPENDENT_AMBULATORY_CARE_PROVIDER_SITE_OTHER): Payer: Self-pay | Admitting: Family Medicine

## 2024-08-17 ENCOUNTER — Encounter (INDEPENDENT_AMBULATORY_CARE_PROVIDER_SITE_OTHER): Payer: Self-pay | Admitting: Family Medicine

## 2024-08-17 VITALS — BP 123/78 | HR 75 | Temp 98.1°F | Ht 66.0 in | Wt 209.0 lb

## 2024-08-17 DIAGNOSIS — I10 Essential (primary) hypertension: Secondary | ICD-10-CM | POA: Diagnosis not present

## 2024-08-17 DIAGNOSIS — E559 Vitamin D deficiency, unspecified: Secondary | ICD-10-CM

## 2024-08-17 DIAGNOSIS — E669 Obesity, unspecified: Secondary | ICD-10-CM | POA: Diagnosis not present

## 2024-08-17 DIAGNOSIS — F5089 Other specified eating disorder: Secondary | ICD-10-CM

## 2024-08-17 DIAGNOSIS — R7303 Prediabetes: Secondary | ICD-10-CM | POA: Diagnosis not present

## 2024-08-17 DIAGNOSIS — E785 Hyperlipidemia, unspecified: Secondary | ICD-10-CM

## 2024-08-17 DIAGNOSIS — F3289 Other specified depressive episodes: Secondary | ICD-10-CM

## 2024-08-17 DIAGNOSIS — Z6833 Body mass index (BMI) 33.0-33.9, adult: Secondary | ICD-10-CM | POA: Diagnosis not present

## 2024-08-17 NOTE — Progress Notes (Signed)
 "  Office: [phone]  /  Fax: [phone]  WEIGHT SUMMARY AND BIOMETRICS  Anthropometric Measurements Height: 5' 6 (1.676 m) Weight: 209 lb (94.8 kg) BMI (Calculated): 33.75 Weight at Last Visit: 208lb Weight Lost Since Last Visit: 0lb Weight Gained Since Last Visit: 1lb Starting Weight: 223lb Total Weight Loss (lbs): 14 lb (6.35 kg) Peak Weight: 230lb   Body Composition  Body Fat %: 43.9 % Fat Mass (lbs): 91.8 lbs Muscle Mass (lbs): 111.2 lbs Total Body Water (lbs): 75.8 lbs Visceral Fat Rating : 13   Other Clinical Data Fasting: Yes Labs: Yes Today's Visit #: 45 Starting Date: 12/28/20    Chief Complaint: OBESITY    History of Present Illness Jillian [name] is a 68 year old female with obesity who presents for obesity treatment and progress assessment.  She is adhering to the category two eating plan about fifty percent of the time and engages in daily exercise for twenty minutes. Despite these efforts, she has gained one pound over the last two months, including during the Thanksgiving and Christmas holidays.  She has a history of prediabetes, hyperlipidemia, and hypertension. Her current medications include bupropion , hydrochlorothiazide , metformin , and vitamin D , with no reported issues.  Her mood is good, and she found a recent beach trip over Christmas beneficial for her mental well-being. She enjoys gardening and is planning her garden layout, which includes growing kale and trying new plants like Thai basil.      PHYSICAL EXAM:  Blood pressure 123/78, pulse 75, temperature 98.1 F (36.7 C), height 5' 6 (1.676 m), weight 209 lb (94.8 kg), SpO2 99%. Body mass index is 33.73 kg/m.  DIAGNOSTIC DATA REVIEWED:  BMET    Component Value Date/Time   NA 140 02/17/2024 1040   K 3.8 02/17/2024 1040   CL 101 02/17/2024 1040   CO2 21 02/17/2024 1040   GLUCOSE 89 02/17/2024 1040   GLUCOSE 94 07/11/2020 0834   BUN 17 02/17/2024 1040    CREATININE 0.95 02/17/2024 1040   CALCIUM  9.6 02/17/2024 1040   GFRNONAA 73 05/12/2019 0805   GFRAA 84 05/12/2019 0805   Lab Results  Component Value Date   HGBA1C 5.6 02/17/2024   HGBA1C 5.6 06/12/2018   Lab Results  Component Value Date   INSULIN  7.3 02/17/2024   INSULIN  8.5 06/12/2018   Lab Results  Component Value Date   TSH 2.300 01/02/2023   CBC    Component Value Date/Time   WBC 4.3 03/05/2022 1017   WBC 4.3 07/11/2020 0834   RBC 4.78 03/05/2022 1017   RBC 4.76 07/11/2020 0834   HGB 13.5 03/05/2022 1017   HCT 41.9 03/05/2022 1017   PLT 236 03/05/2022 1017   MCV 88 03/05/2022 1017   MCH 28.2 03/05/2022 1017   MCHC 32.2 03/05/2022 1017   MCHC 33.1 07/11/2020 0834   RDW 12.8 03/05/2022 1017   Iron Studies No results found for: IRON, TIBC, FERRITIN, IRONPCTSAT Lipid Panel     Component Value Date/Time   CHOL 189 02/17/2024 1040   TRIG 52 02/17/2024 1040   HDL 88 02/17/2024 1040   CHOLHDL 2 07/11/2020 0834   VLDL 9.2 07/11/2020 0834   LDLCALC 91 02/17/2024 1040   LDLDIRECT 158.6 08/18/2013 0803   Hepatic Function Panel     Component Value Date/Time   PROT 7.3 02/17/2024 1040   ALBUMIN 4.5 02/17/2024 1040   AST 28 02/17/2024 1040   ALT 19 02/17/2024 1040   ALKPHOS 60 02/17/2024 1040   BILITOT  0.5 02/17/2024 1040   BILIDIR 0.0 08/10/2019 1608      Component Value Date/Time   TSH 2.300 01/02/2023 1330   Nutritional Lab Results  Component Value Date   VD25OH 73.3 02/17/2024   VD25OH 70.5 08/12/2023   VD25OH 56.3 01/02/2023     Assessment and Plan Assessment & Plan Obesity Management is ongoing with a category two eating plan followed 50% of the time and daily exercise for 20 minutes. Weight gain of 1 pound over the last two months, including holidays, is better than expected. - Continue category two eating plan - Continue daily exercise for 20 minutes  Prediabetes Managed with diet, exercise, and weight loss. No specific changes in  management discussed. - Continue current management with diet, exercise, and weight loss - Continue diet, exercise and weight loss as discussed today as an important part of the treatment plan   Hyperlipidemia Managed with diet, exercise, and weight loss. Labs to be checked to assess cholesterol levels. - Ordered labs to assess cholesterol levels - Continue diet, exercise and weight loss as discussed today as an important part of the treatment plan   Essential hypertension Managed with hydrochlorothiazide . No specific changes in management discussed. - Continue hydrochlorothiazide  - Continue diet, exercise and weight loss as discussed today as an important part of the treatment plan   Vitamin D  deficiency Managed with weekly supplementation. Labs to be checked to assess vitamin D  levels. - Ordered labs to assess vitamin D  levels - Continue diet, exercise and weight loss as discussed today as an important part of the treatment plan   Emotional eating behaviors Addressed with activities to prevent snacking. No specific changes in management discussed. - Continue Wellbutrin  and will continue to follow      Patients who are on anti-obesity medications are counseled on the importance of maintaining healthy lifestyle habits, including balanced nutrition, regular physical activity, and behavioral modifications,  Medication is an adjunct to, not a replacement for, lifestyle changes and that the long-term success and weight maintenance depend on continued adherence to these strategies.   Jillian [name] was informed of the importance of frequent follow up visits to maximize her success with intensive lifestyle modifications for her obesity and obesity related health conditions as recommended by USPSTF and CMS guidelines   Louann Penton, MD   "

## 2024-08-18 ENCOUNTER — Ambulatory Visit (INDEPENDENT_AMBULATORY_CARE_PROVIDER_SITE_OTHER): Admitting: Primary Care

## 2024-08-18 ENCOUNTER — Encounter: Payer: Self-pay | Admitting: Primary Care

## 2024-08-18 VITALS — BP 116/68 | HR 91 | Temp 98.4°F | Ht 65.25 in | Wt 215.1 lb

## 2024-08-18 DIAGNOSIS — M5441 Lumbago with sciatica, right side: Secondary | ICD-10-CM | POA: Diagnosis not present

## 2024-08-18 DIAGNOSIS — I10 Essential (primary) hypertension: Secondary | ICD-10-CM | POA: Diagnosis not present

## 2024-08-18 DIAGNOSIS — G8929 Other chronic pain: Secondary | ICD-10-CM

## 2024-08-18 DIAGNOSIS — E785 Hyperlipidemia, unspecified: Secondary | ICD-10-CM

## 2024-08-18 DIAGNOSIS — M5442 Lumbago with sciatica, left side: Secondary | ICD-10-CM | POA: Diagnosis not present

## 2024-08-18 DIAGNOSIS — F330 Major depressive disorder, recurrent, mild: Secondary | ICD-10-CM | POA: Diagnosis not present

## 2024-08-18 DIAGNOSIS — R7303 Prediabetes: Secondary | ICD-10-CM | POA: Diagnosis not present

## 2024-08-18 LAB — CBC WITH DIFFERENTIAL/PLATELET
Basophils Absolute: 0 x10E3/uL (ref 0.0–0.2)
Basos: 1 %
EOS (ABSOLUTE): 0.1 x10E3/uL (ref 0.0–0.4)
Eos: 3 %
Hematocrit: 42.8 % (ref 34.0–46.6)
Hemoglobin: 13.6 g/dL (ref 11.1–15.9)
Immature Grans (Abs): 0 x10E3/uL (ref 0.0–0.1)
Immature Granulocytes: 0 %
Lymphocytes Absolute: 1.7 x10E3/uL (ref 0.7–3.1)
Lymphs: 39 %
MCH: 28 pg (ref 26.6–33.0)
MCHC: 31.8 g/dL (ref 31.5–35.7)
MCV: 88 fL (ref 79–97)
Monocytes Absolute: 0.4 x10E3/uL (ref 0.1–0.9)
Monocytes: 10 %
Neutrophils Absolute: 2 x10E3/uL (ref 1.4–7.0)
Neutrophils: 47 %
Platelets: 280 x10E3/uL (ref 150–450)
RBC: 4.85 x10E6/uL (ref 3.77–5.28)
RDW: 13.3 % (ref 11.7–15.4)
WBC: 4.3 x10E3/uL (ref 3.4–10.8)

## 2024-08-18 LAB — LIPID PANEL WITH LDL/HDL RATIO
Cholesterol, Total: 207 mg/dL — ABNORMAL HIGH (ref 100–199)
HDL: 98 mg/dL
LDL Chol Calc (NIH): 99 mg/dL (ref 0–99)
LDL/HDL Ratio: 1 ratio (ref 0.0–3.2)
Triglycerides: 54 mg/dL (ref 0–149)
VLDL Cholesterol Cal: 10 mg/dL (ref 5–40)

## 2024-08-18 LAB — CMP14+EGFR
ALT: 22 IU/L (ref 0–32)
AST: 24 IU/L (ref 0–40)
Albumin: 4.5 g/dL (ref 3.9–4.9)
Alkaline Phosphatase: 62 IU/L (ref 49–135)
BUN/Creatinine Ratio: 18 (ref 12–28)
BUN: 15 mg/dL (ref 8–27)
Bilirubin Total: 0.5 mg/dL (ref 0.0–1.2)
CO2: 23 mmol/L (ref 20–29)
Calcium: 10 mg/dL (ref 8.7–10.3)
Chloride: 102 mmol/L (ref 96–106)
Creatinine, Ser: 0.83 mg/dL (ref 0.57–1.00)
Globulin, Total: 2.8 g/dL (ref 1.5–4.5)
Glucose: 95 mg/dL (ref 70–99)
Potassium: 4.3 mmol/L (ref 3.5–5.2)
Sodium: 141 mmol/L (ref 134–144)
Total Protein: 7.3 g/dL (ref 6.0–8.5)
eGFR: 77 mL/min/1.73

## 2024-08-18 LAB — VITAMIN B12: Vitamin B-12: 1901 pg/mL — ABNORMAL HIGH (ref 232–1245)

## 2024-08-18 LAB — VITAMIN D 25 HYDROXY (VIT D DEFICIENCY, FRACTURES): Vit D, 25-Hydroxy: 96.6 ng/mL (ref 30.0–100.0)

## 2024-08-18 LAB — HEMOGLOBIN A1C
Est. average glucose Bld gHb Est-mCnc: 114 mg/dL
Hgb A1c MFr Bld: 5.6 % (ref 4.8–5.6)

## 2024-08-18 LAB — TSH: TSH: 2.36 u[IU]/mL (ref 0.450–4.500)

## 2024-08-18 LAB — INSULIN, RANDOM: INSULIN: 9.9 u[IU]/mL (ref 2.6–24.9)

## 2024-08-18 NOTE — Assessment & Plan Note (Signed)
Controlled.  Continue bupropion SR 200 mg daily.

## 2024-08-18 NOTE — Assessment & Plan Note (Signed)
 Controlled.  Continue hydrochlorothiazide   12.5 mg daily. CMP reviewed from December 2025.

## 2024-08-18 NOTE — Assessment & Plan Note (Signed)
 Stable.  Continue atorvastatin  20 mg daily. Reviewed lipid panel from December 2025.

## 2024-08-18 NOTE — Assessment & Plan Note (Signed)
 Stable.  Continue metformin  500 mg BID. Reviewed A1C from December 2025

## 2024-08-18 NOTE — Assessment & Plan Note (Addendum)
 Waxes and wanes.   Following with orthopedics.

## 2024-08-18 NOTE — Progress Notes (Signed)
 "  Subjective:    Patient ID: Jillian [name], female    DOB: 13-Feb-1956, 68 y.o.   MRN: [id number]  Jillian [name] is a very pleasant 68 y.o. female with a history of hypertension, hyperlipidemia, prediabetes, depression, insomnia who presents today for follow-up of chronic conditions.   Immunizations: -Tetanus: Completed in 2020 -Influenza: Completed this season -Shingles: Completed Shingrix series -Pneumonia: Completed last in 2022  Mammogram: Completed in December 2025 Bone Density Scan: Completed in February 2025  Colonoscopy: Completed in 2024, due 2029  1) Hypertension/Hyperlipidemia: Currently managed on atorvastatin  20 mg daily, hydrochlorothiazide  12.5 mg daily.  Her last lipid panel was completed yesterday with LDL of 99.  She denies chest pain, dizziness, headaches.   BP Readings from Last 3 Encounters:  08/18/24 116/68  08/17/24 123/78  07/13/24 120/76   2) Prediabetes: Follows with [facility] and is managed on metformin  500 mg twice daily.  Her last A1c was 5.6 as of yesterday.  Wt Readings from Last 3 Encounters:  08/18/24 215 lb 2 oz (97.6 kg)  08/17/24 209 lb (94.8 kg)  08/06/24 208 lb (94.3 kg)    3) Depression: Currently managed on bupropion  SR 200 mg once daily. Overall feels well managed on her current regimen. Her anxiety has improved.     Review of Systems  Respiratory:  Negative for shortness of breath.   Cardiovascular:  Negative for chest pain.  Gastrointestinal:  Negative for constipation and diarrhea.  Neurological:  Negative for dizziness and headaches.  Psychiatric/Behavioral:  The patient is not nervous/anxious.          Past Medical History:  Diagnosis Date   Age-related nuclear cataract of both eyes 11/08/2020   Allergic rhinitis    Allergy    Arthritis    Arthritis of low back    Back pain    Cataract    Cataracts, bilateral    Constipation    Depression    Dry skin    on hands    Fibroid uterus    Hyperlipidemia    Joint pain    Osteoarthritis of right shoulder    Pre-diabetes    Ringing in ears    Sciatica    sciatica   Vitamin D  deficiency     Social History   Socioeconomic History   Marital status: Married    Spouse name: Charlie Gavel   Number of children: 0   Years of education: Not on file   Highest education level: Not on file  Occupational History   Occupation: Retired Automotive Engineer  Tobacco Use   Smoking status: Former    Current packs/day: 1.00    Average packs/day: 1 pack/day for 29.0 years (29.0 ttl pk-yrs)    Types: Cigarettes   Smokeless tobacco: Never   Tobacco comments:    30 years  Vaping Use   Vaping status: Never Used  Substance and Sexual Activity   Alcohol use: No   Drug use: No   Sexual activity: Not on file  Other Topics Concern   Not on file  Social History Narrative   Not on file   Social Drivers of Health   Tobacco Use: Medium Risk (08/18/2024)   Patient History    Smoking Tobacco Use: Former    Smokeless Tobacco Use: Never    Passive Exposure: Not on file  Financial Resource Strain: Low Risk (08/06/2023)   Overall Financial Resource Strain (CARDIA)    Difficulty of Paying Living Expenses: Not  hard at all  Food Insecurity: No Food Insecurity (08/06/2024)   Epic    Worried About Programme Researcher, Broadcasting/film/video in the Last Year: Never true    Ran Out of Food in the Last Year: Never true  Transportation Needs: No Transportation Needs (08/06/2024)   Epic    Lack of Transportation (Medical): No    Lack of Transportation (Non-Medical): No  Physical Activity: Insufficiently Active (08/06/2024)   Exercise Vital Sign    Days of Exercise per Week: 3 days    Minutes of Exercise per Session: 30 min  Stress: No Stress Concern Present (08/06/2024)   [employer] of Occupational Health - Occupational Stress Questionnaire    Feeling of Stress: Not at all  Social Connections: Socially Integrated (08/06/2024)   Social  Connection and Isolation Panel    Frequency of Communication with Friends and Family: More than three times a week    Frequency of Social Gatherings with Friends and Family: Once a week    Attends Religious Services: More than 4 times per year    Active Member of [employer] or Organizations: Yes    Attends Banker Meetings: More than 4 times per year    Marital Status: Married  Catering Manager Violence: Not At Risk (08/06/2024)   Epic    Fear of Current or Ex-Partner: No    Emotionally Abused: No    Physically Abused: No    Sexually Abused: No  Depression (PHQ2-9): Low Risk (08/18/2024)   Depression (PHQ2-9)    PHQ-2 Score: 0  Alcohol Screen: Low Risk (08/06/2023)   Alcohol Screen    Last Alcohol Screening Score (AUDIT): 0  Housing: Unknown (08/06/2024)   Epic    Unable to Pay for Housing in the Last Year: No    Number of Times Moved in the Last Year: Not on file    Homeless in the Last Year: No  Utilities: Not At Risk (08/06/2024)   Epic    Threatened with loss of utilities: No  Health Literacy: Adequate Health Literacy (08/06/2024)   B1300 Health Literacy    Frequency of need for help with medical instructions: Never    Past Surgical History:  Procedure Laterality Date   ABDOMINAL HYSTERECTOMY     broken ankle     CARPAL TUNNEL RELEASE Bilateral    CATARACT EXTRACTION W/ INTRAOCULAR LENS IMPLANT Bilateral 05/22/2021   2nd eye done 07/03/21   FRACTURE SURGERY  1985   !   SHOULDER SURGERY Right    SKIN BIOPSY Right 10/2021    Family History  Problem Relation Age of Onset   Diabetes Mother        father and 9 siblings   Kidney disease Mother        renal failure   Hypertension Mother    Hyperlipidemia Mother    Stroke Mother    Depression Mother    Anxiety disorder Mother    Obesity Mother    Heart failure Mother    Heart disease Father        paternal grandmother   Obesity Father    Diabetes Father    Hypertension Father    Hyperlipidemia Father     Eating disorder Father    Colon cancer Sister    Kidney cancer Sister    Arthritis Sister    Asthma Sister    Kidney disease Sister    Colon polyps Sister    Stroke Sister    Vision loss Sister  Prostate cancer Brother    Cancer Sister    Diabetes Sister    Early death Brother    Esophageal cancer Neg Hx    Rectal cancer Neg Hx    Stomach cancer Neg Hx     Allergies[1]  Medications Ordered Prior to Encounter[2]  BP 116/68   Pulse 91   Temp 98.4 F (36.9 C) (Oral)   Ht 5' 5.25 (1.657 m)   Wt 215 lb 2 oz (97.6 kg)   SpO2 97%   BMI 35.52 kg/m  Objective:   Physical Exam Cardiovascular:     Rate and Rhythm: Normal rate and regular rhythm.  Pulmonary:     Effort: Pulmonary effort is normal.     Breath sounds: Normal breath sounds.  Musculoskeletal:     Cervical back: Neck supple.  Skin:    General: Skin is warm and dry.  Neurological:     Mental Status: She is alert and oriented to person, place, and time.  Psychiatric:        Mood and Affect: Mood normal.     Physical Exam        Assessment & Plan:  Chronic left-sided low back pain with bilateral sciatica Assessment & Plan: Waxes and wanes.   Following with orthopedics.     Hyperlipidemia, unspecified hyperlipidemia type Assessment & Plan: Stable.  Continue atorvastatin  20 mg daily. Reviewed lipid panel from December 2025.   Mild episode of recurrent major depressive disorder Assessment & Plan: Controlled.  Continue bupropion  SR 200 mg daily.    Prediabetes Assessment & Plan: Stable.  Continue metformin  500 mg BID. Reviewed A1C from December 2025   Essential hypertension Assessment & Plan: Controlled.  Continue hydrochlorothiazide   12.5 mg daily. CMP reviewed from December 2025.     Assessment and Plan Assessment & Plan         Comer MARLA Gaskins, NP       [1] No Known Allergies [2]  Current Outpatient Medications on File Prior to Visit  Medication  Sig Dispense Refill   Ascorbic Acid (VITAMIN C) 1000 MG tablet Take 1,000 mg by mouth daily.     aspirin EC 81 MG tablet Take 81 mg by mouth daily.     atorvastatin  (LIPITOR) 20 MG tablet Take 1 tablet (20 mg total) by mouth every evening. for cholesterol 90 tablet 0   buPROPion  (WELLBUTRIN  SR) 200 MG 12 hr tablet Take 1 tablet (200 mg total) by mouth every morning. 90 tablet 0   CALCIUM -MAGNESIUM-ZINC PO Take 1 capsule by mouth daily.     cetirizine  (ZYRTEC ) 10 MG tablet Take 1 tablet (10 mg total) by mouth daily as needed. 90 tablet 1   Coenzyme Q10 (CO Q 10) 100 MG CAPS Take 1 capsule by mouth daily.     fluocinonide -emollient (LIDEX -E) 0.05 % cream APPLY  CREAM EXTERNALLY TWICE DAILY TO  AFFECTED  AREA 60 g 0   fluticasone  (FLONASE ) 50 MCG/ACT nasal spray Place 1 spray into both nostrils daily as needed. 16 g 2   GARLIC PO Take 600 mg by mouth daily.     hydrochlorothiazide  (HYDRODIURIL ) 12.5 MG tablet Take 1 tablet (12.5 mg total) by mouth daily. 90 tablet 0   Hydroquinone 8 % EMUL Apply topically.     meloxicam  (MOBIC ) 15 MG tablet Take 1 tablet by mouth once daily 30 tablet 0   metFORMIN  (GLUCOPHAGE ) 500 MG tablet Take 1 tablet (500 mg total) by mouth 2 (two) times daily with a meal. For  blood sugar. 180 tablet 0   Multiple Vitamins-Minerals (ALIVE ONCE DAILY WOMENS 50+) TABS Take 1 tablet by mouth daily.     Omega-3 1000 MG CAPS Take 1 capsule by mouth daily.     Probiotic Product (PROBIOTIC-10) CAPS Take 1 capsule by mouth daily.     Turmeric Curcumin 500 MG CAPS Take 1 capsule by mouth daily.     Vitamin D , Ergocalciferol , (DRISDOL ) 1.25 MG (50000 UNIT) CAPS capsule Take 1 capsule (50,000 Units total) by mouth every 7 (seven) days. 12 capsule 0   vitamin E 400 UNIT capsule Take 400 Units by mouth daily.     No current facility-administered medications on file prior to visit.   "

## 2024-09-16 ENCOUNTER — Ambulatory Visit (INDEPENDENT_AMBULATORY_CARE_PROVIDER_SITE_OTHER): Admitting: Family Medicine

## 2024-10-15 ENCOUNTER — Ambulatory Visit (INDEPENDENT_AMBULATORY_CARE_PROVIDER_SITE_OTHER): Admitting: Family Medicine

## 2025-08-24 ENCOUNTER — Encounter: Admitting: Primary Care

## 2025-08-24 ENCOUNTER — Ambulatory Visit
